# Patient Record
Sex: Male | Born: 1945 | Race: White | Hispanic: No | Marital: Married | Smoking: Never smoker
Health system: Southern US, Community
[De-identification: ages and names within clinical notes are randomized; demographics above are authoritative.]

## PROBLEM LIST (undated history)

## (undated) DIAGNOSIS — M1990 Unspecified osteoarthritis, unspecified site: Secondary | ICD-10-CM

## (undated) DIAGNOSIS — E785 Hyperlipidemia, unspecified: Secondary | ICD-10-CM

## (undated) DIAGNOSIS — C679 Malignant neoplasm of bladder, unspecified: Secondary | ICD-10-CM

## (undated) DIAGNOSIS — S8292XA Unspecified fracture of left lower leg, initial encounter for closed fracture: Secondary | ICD-10-CM

## (undated) DIAGNOSIS — Z96642 Presence of left artificial hip joint: Secondary | ICD-10-CM

## (undated) DIAGNOSIS — C439 Malignant melanoma of skin, unspecified: Secondary | ICD-10-CM

## (undated) DIAGNOSIS — D649 Anemia, unspecified: Secondary | ICD-10-CM

## (undated) DIAGNOSIS — K2970 Gastritis, unspecified, without bleeding: Secondary | ICD-10-CM

## (undated) DIAGNOSIS — H409 Unspecified glaucoma: Secondary | ICD-10-CM

## (undated) DIAGNOSIS — M109 Gout, unspecified: Secondary | ICD-10-CM

## (undated) DIAGNOSIS — N200 Calculus of kidney: Secondary | ICD-10-CM

## (undated) DIAGNOSIS — K56609 Unspecified intestinal obstruction, unspecified as to partial versus complete obstruction: Secondary | ICD-10-CM

## (undated) DIAGNOSIS — B9681 Helicobacter pylori [H. pylori] as the cause of diseases classified elsewhere: Secondary | ICD-10-CM

## (undated) DIAGNOSIS — I10 Essential (primary) hypertension: Secondary | ICD-10-CM

## (undated) DIAGNOSIS — J189 Pneumonia, unspecified organism: Secondary | ICD-10-CM

## (undated) DIAGNOSIS — R011 Cardiac murmur, unspecified: Secondary | ICD-10-CM

## (undated) DIAGNOSIS — Z87442 Personal history of urinary calculi: Secondary | ICD-10-CM

## (undated) DIAGNOSIS — Z9889 Other specified postprocedural states: Secondary | ICD-10-CM

## (undated) HISTORY — PX: KNEE ARTHROSCOPY: SUR90

## (undated) HISTORY — DX: Unspecified glaucoma: H40.9

## (undated) HISTORY — DX: Hyperlipidemia, unspecified: E78.5

## (undated) HISTORY — DX: Presence of left artificial hip joint: Z96.642

## (undated) HISTORY — DX: Gastritis, unspecified, without bleeding: K29.70

## (undated) HISTORY — PX: FRACTURE SURGERY: SHX138

## (undated) HISTORY — DX: Helicobacter pylori (H. pylori) as the cause of diseases classified elsewhere: B96.81

## (undated) HISTORY — PX: HERNIA REPAIR: SHX51

## (undated) HISTORY — DX: Malignant melanoma of skin, unspecified: C43.9

---
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
## 1994-03-20 DIAGNOSIS — C439 Malignant melanoma of skin, unspecified: Secondary | ICD-10-CM

## 1994-03-20 HISTORY — DX: Malignant melanoma of skin, unspecified: C43.9

## 2002-02-05 ENCOUNTER — Emergency Department (HOSPITAL_COMMUNITY): Admission: EM | Admit: 2002-02-05 | Discharge: 2002-02-05 | Payer: Self-pay | Admitting: Emergency Medicine

## 2002-02-05 ENCOUNTER — Encounter: Payer: Self-pay | Admitting: Emergency Medicine

## 2002-03-20 HISTORY — PX: LUMBAR LAMINECTOMY: SHX95

## 2002-05-23 ENCOUNTER — Encounter: Payer: Self-pay | Admitting: Orthopedic Surgery

## 2002-05-30 ENCOUNTER — Encounter: Payer: Self-pay | Admitting: Orthopedic Surgery

## 2002-05-30 ENCOUNTER — Observation Stay (HOSPITAL_COMMUNITY): Admission: RE | Admit: 2002-05-30 | Discharge: 2002-06-01 | Payer: Self-pay | Admitting: Orthopedic Surgery

## 2002-07-19 HISTORY — PX: COLONOSCOPY: SHX174

## 2002-08-02 ENCOUNTER — Encounter: Payer: Self-pay | Admitting: Internal Medicine

## 2002-08-02 ENCOUNTER — Encounter: Admission: RE | Admit: 2002-08-02 | Discharge: 2002-08-02 | Payer: Self-pay | Admitting: Internal Medicine

## 2004-03-22 ENCOUNTER — Emergency Department (HOSPITAL_COMMUNITY): Admission: EM | Admit: 2004-03-22 | Discharge: 2004-03-22 | Payer: Self-pay | Admitting: Emergency Medicine

## 2005-01-06 ENCOUNTER — Ambulatory Visit: Payer: Self-pay | Admitting: Internal Medicine

## 2005-06-09 ENCOUNTER — Ambulatory Visit: Payer: Self-pay | Admitting: Internal Medicine

## 2005-08-04 ENCOUNTER — Ambulatory Visit: Payer: Self-pay | Admitting: Internal Medicine

## 2006-03-08 ENCOUNTER — Ambulatory Visit: Payer: Self-pay | Admitting: Internal Medicine

## 2006-03-08 LAB — CONVERTED CEMR LAB
ALT: 38 units/L (ref 0–40)
AST: 33 units/L (ref 0–37)
BUN: 12 mg/dL (ref 6–23)
Chol/HDL Ratio, serum: 6.9
Cholesterol: 235 mg/dL (ref 0–200)
Creatinine, Ser: 1.1 mg/dL (ref 0.4–1.5)
Glucose, Bld: 107 mg/dL — ABNORMAL HIGH (ref 70–99)
HDL: 34 mg/dL — ABNORMAL LOW (ref >39.0)
LDL DIRECT: 187.2 mg/dL
PSA: 0.46 ng/mL (ref 0.10–4.00)
Potassium: 4.3 meq/L (ref 3.5–5.1)
Total CK: 224 units/L (ref 7–195)
Triglyceride fasting, serum: 151 mg/dL — ABNORMAL HIGH (ref 0–149)
Uric Acid, Serum: 8.2 mg/dL — ABNORMAL HIGH (ref 2.4–7.0)
VLDL: 30 mg/dL (ref 0–40)

## 2006-05-31 ENCOUNTER — Ambulatory Visit: Payer: Self-pay | Admitting: Internal Medicine

## 2006-07-06 ENCOUNTER — Ambulatory Visit: Payer: Self-pay | Admitting: Internal Medicine

## 2006-07-06 LAB — CONVERTED CEMR LAB
ALT: 28 units/L (ref 0–40)
AST: 28 units/L (ref 0–37)
Cholesterol: 138 mg/dL (ref 0–200)
HDL: 34.9 mg/dL — ABNORMAL LOW (ref >39.0)
Hgb A1c MFr Bld: 5.7 % (ref 4.6–6.0)
Total CHOL/HDL Ratio: 4
Total CK: 208 units/L (ref 7–195)
Triglycerides: 111 mg/dL (ref 0–149)
Uric Acid, Serum: 6.3 mg/dL (ref 2.4–7.0)
VLDL: 22 mg/dL (ref 0–40)

## 2006-12-21 ENCOUNTER — Ambulatory Visit: Payer: Self-pay | Admitting: Internal Medicine

## 2006-12-28 ENCOUNTER — Encounter (INDEPENDENT_AMBULATORY_CARE_PROVIDER_SITE_OTHER): Payer: Self-pay | Admitting: *Deleted

## 2006-12-28 LAB — CONVERTED CEMR LAB
ALT: 30 units/L (ref 0–53)
AST: 28 units/L (ref 0–37)
Cholesterol: 136 mg/dL (ref 0–200)
HDL: 28.2 mg/dL — ABNORMAL LOW (ref >39.0)
LDL Cholesterol: 86 mg/dL (ref 0–99)
Total CHOL/HDL Ratio: 4.8
Triglycerides: 108 mg/dL (ref 0–149)
VLDL: 22 mg/dL (ref 0–40)

## 2007-01-07 DIAGNOSIS — R0989 Other specified symptoms and signs involving the circulatory and respiratory systems: Secondary | ICD-10-CM | POA: Insufficient documentation

## 2007-01-07 DIAGNOSIS — Z9889 Other specified postprocedural states: Secondary | ICD-10-CM

## 2007-01-07 DIAGNOSIS — K36 Other appendicitis: Secondary | ICD-10-CM | POA: Insufficient documentation

## 2007-01-07 HISTORY — DX: Other specified postprocedural states: Z98.89

## 2007-01-11 ENCOUNTER — Ambulatory Visit: Payer: Self-pay | Admitting: Internal Medicine

## 2007-01-11 DIAGNOSIS — I152 Hypertension secondary to endocrine disorders: Secondary | ICD-10-CM | POA: Insufficient documentation

## 2007-01-11 DIAGNOSIS — I10 Essential (primary) hypertension: Secondary | ICD-10-CM | POA: Insufficient documentation

## 2007-01-11 DIAGNOSIS — H409 Unspecified glaucoma: Secondary | ICD-10-CM | POA: Insufficient documentation

## 2007-01-11 DIAGNOSIS — E1169 Type 2 diabetes mellitus with other specified complication: Secondary | ICD-10-CM | POA: Insufficient documentation

## 2007-01-11 DIAGNOSIS — E782 Mixed hyperlipidemia: Secondary | ICD-10-CM | POA: Insufficient documentation

## 2007-01-11 LAB — CONVERTED CEMR LAB
Cholesterol, target level: 200 mg/dL
HDL goal, serum: 40 mg/dL
LDL Goal: 130 mg/dL

## 2007-06-25 ENCOUNTER — Telehealth (INDEPENDENT_AMBULATORY_CARE_PROVIDER_SITE_OTHER): Payer: Self-pay | Admitting: *Deleted

## 2007-09-02 ENCOUNTER — Telehealth (INDEPENDENT_AMBULATORY_CARE_PROVIDER_SITE_OTHER): Payer: Self-pay | Admitting: *Deleted

## 2007-09-06 ENCOUNTER — Encounter (INDEPENDENT_AMBULATORY_CARE_PROVIDER_SITE_OTHER): Payer: Self-pay | Admitting: *Deleted

## 2007-10-23 ENCOUNTER — Ambulatory Visit: Payer: Self-pay | Admitting: Internal Medicine

## 2007-10-23 DIAGNOSIS — Z8679 Personal history of other diseases of the circulatory system: Secondary | ICD-10-CM | POA: Insufficient documentation

## 2007-10-23 DIAGNOSIS — R748 Abnormal levels of other serum enzymes: Secondary | ICD-10-CM | POA: Insufficient documentation

## 2007-10-28 ENCOUNTER — Telehealth (INDEPENDENT_AMBULATORY_CARE_PROVIDER_SITE_OTHER): Payer: Self-pay | Admitting: *Deleted

## 2008-01-31 ENCOUNTER — Ambulatory Visit: Payer: Self-pay | Admitting: Internal Medicine

## 2008-01-31 ENCOUNTER — Telehealth (INDEPENDENT_AMBULATORY_CARE_PROVIDER_SITE_OTHER): Payer: Self-pay | Admitting: *Deleted

## 2008-02-02 LAB — CONVERTED CEMR LAB
Creatinine,U: 189.2 mg/dL
Hgb A1c MFr Bld: 6.3 % — ABNORMAL HIGH (ref 4.6–6.0)
Microalb Creat Ratio: 7.9 mg/g (ref 0.0–30.0)
Microalb, Ur: 1.5 mg/dL (ref 0.0–1.9)

## 2008-02-03 ENCOUNTER — Encounter (INDEPENDENT_AMBULATORY_CARE_PROVIDER_SITE_OTHER): Payer: Self-pay | Admitting: *Deleted

## 2008-03-17 ENCOUNTER — Telehealth: Payer: Self-pay | Admitting: Internal Medicine

## 2008-03-27 ENCOUNTER — Ambulatory Visit: Payer: Self-pay | Admitting: Internal Medicine

## 2008-03-27 DIAGNOSIS — Z8739 Personal history of other diseases of the musculoskeletal system and connective tissue: Secondary | ICD-10-CM | POA: Insufficient documentation

## 2008-05-18 HISTORY — PX: SHOULDER SURGERY: SHX246

## 2008-06-10 ENCOUNTER — Ambulatory Visit (HOSPITAL_COMMUNITY): Admission: RE | Admit: 2008-06-10 | Discharge: 2008-06-11 | Payer: Self-pay | Admitting: Orthopedic Surgery

## 2008-07-24 ENCOUNTER — Ambulatory Visit: Payer: Self-pay | Admitting: Internal Medicine

## 2008-07-25 LAB — CONVERTED CEMR LAB
ALT: 26 units/L (ref 0–53)
AST: 21 units/L (ref 0–37)
Albumin: 3.6 g/dL (ref 3.5–5.2)
Alkaline Phosphatase: 54 units/L (ref 39–117)
BUN: 14 mg/dL (ref 6–23)
Bilirubin, Direct: 0 mg/dL (ref 0.0–0.3)
Cholesterol: 130 mg/dL (ref 0–200)
Creatinine, Ser: 1 mg/dL (ref 0.4–1.5)
HDL: 30.4 mg/dL — ABNORMAL LOW (ref >39.00)
Hgb A1c MFr Bld: 6.1 % (ref 4.6–6.5)
LDL Cholesterol: 77 mg/dL (ref 0–99)
Potassium: 4.2 meq/L (ref 3.5–5.1)
Total Bilirubin: 0.7 mg/dL (ref 0.3–1.2)
Total CHOL/HDL Ratio: 4
Total Protein: 7 g/dL (ref 6.0–8.3)
Triglycerides: 113 mg/dL (ref 0.0–149.0)
VLDL: 22.6 mg/dL (ref 0.0–40.0)

## 2008-07-27 ENCOUNTER — Encounter (INDEPENDENT_AMBULATORY_CARE_PROVIDER_SITE_OTHER): Payer: Self-pay | Admitting: *Deleted

## 2008-09-11 ENCOUNTER — Encounter: Payer: Self-pay | Admitting: Internal Medicine

## 2008-09-25 ENCOUNTER — Ambulatory Visit: Payer: Self-pay | Admitting: Internal Medicine

## 2008-09-25 LAB — CONVERTED CEMR LAB
ALT: 39 units/L (ref 0–53)
AST: 32 units/L (ref 0–37)
Albumin: 3.6 g/dL (ref 3.5–5.2)
Alkaline Phosphatase: 51 units/L (ref 39–117)
BUN: 19 mg/dL (ref 6–23)
Bilirubin, Direct: 0.1 mg/dL (ref 0.0–0.3)
Cholesterol: 125 mg/dL (ref 0–200)
Creatinine, Ser: 1.2 mg/dL (ref 0.4–1.5)
Creatinine,U: 251.2 mg/dL
HDL: 31.4 mg/dL — ABNORMAL LOW (ref >39.00)
Hgb A1c MFr Bld: 6.6 % — ABNORMAL HIGH (ref 4.6–6.5)
LDL Cholesterol: 71 mg/dL (ref 0–99)
Microalb Creat Ratio: 7.6 mg/g (ref 0.0–30.0)
Microalb, Ur: 1.9 mg/dL (ref 0.0–1.9)
Potassium: 4.4 meq/L (ref 3.5–5.1)
Total Bilirubin: 0.8 mg/dL (ref 0.3–1.2)
Total CHOL/HDL Ratio: 4
Total Protein: 6.6 g/dL (ref 6.0–8.3)
Triglycerides: 111 mg/dL (ref 0.0–149.0)
VLDL: 22.2 mg/dL (ref 0.0–40.0)

## 2008-10-02 ENCOUNTER — Ambulatory Visit: Payer: Self-pay | Admitting: Internal Medicine

## 2008-10-02 DIAGNOSIS — E1140 Type 2 diabetes mellitus with diabetic neuropathy, unspecified: Secondary | ICD-10-CM | POA: Insufficient documentation

## 2008-10-02 DIAGNOSIS — E1165 Type 2 diabetes mellitus with hyperglycemia: Secondary | ICD-10-CM

## 2009-01-29 ENCOUNTER — Ambulatory Visit: Payer: Self-pay | Admitting: Internal Medicine

## 2009-02-01 LAB — CONVERTED CEMR LAB
BUN: 10 mg/dL (ref 6–23)
Creatinine, Ser: 1 mg/dL (ref 0.4–1.5)
Hgb A1c MFr Bld: 5.9 % (ref 4.6–6.5)
Potassium: 4.2 meq/L (ref 3.5–5.1)

## 2009-02-05 ENCOUNTER — Ambulatory Visit: Payer: Self-pay | Admitting: Internal Medicine

## 2009-04-29 ENCOUNTER — Telehealth (INDEPENDENT_AMBULATORY_CARE_PROVIDER_SITE_OTHER): Payer: Self-pay | Admitting: *Deleted

## 2009-05-10 ENCOUNTER — Telehealth (INDEPENDENT_AMBULATORY_CARE_PROVIDER_SITE_OTHER): Payer: Self-pay | Admitting: *Deleted

## 2009-07-23 ENCOUNTER — Ambulatory Visit: Payer: Self-pay | Admitting: Internal Medicine

## 2009-08-01 LAB — CONVERTED CEMR LAB
ALT: 24 units/L (ref 0–53)
AST: 23 units/L (ref 0–37)
Albumin: 3.8 g/dL (ref 3.5–5.2)
Alkaline Phosphatase: 57 units/L (ref 39–117)
BUN: 16 mg/dL (ref 6–23)
Bilirubin, Direct: 0.1 mg/dL (ref 0.0–0.3)
Cholesterol: 158 mg/dL (ref 0–200)
Creatinine, Ser: 1 mg/dL (ref 0.4–1.5)
Creatinine,U: 143.8 mg/dL
HDL: 33.3 mg/dL — ABNORMAL LOW (ref >39.00)
Hgb A1c MFr Bld: 6.4 % (ref 4.6–6.5)
LDL Cholesterol: 101 mg/dL — ABNORMAL HIGH (ref 0–99)
Microalb Creat Ratio: 0.3 mg/g (ref 0.0–30.0)
Microalb, Ur: 0.5 mg/dL (ref 0.0–1.9)
Potassium: 4.9 meq/L (ref 3.5–5.1)
Total Bilirubin: 0.6 mg/dL (ref 0.3–1.2)
Total CHOL/HDL Ratio: 5
Total Protein: 6.9 g/dL (ref 6.0–8.3)
Triglycerides: 118 mg/dL (ref 0.0–149.0)
VLDL: 23.6 mg/dL (ref 0.0–40.0)

## 2009-08-06 ENCOUNTER — Ambulatory Visit: Payer: Self-pay | Admitting: Internal Medicine

## 2010-01-28 ENCOUNTER — Telehealth: Payer: Self-pay | Admitting: Internal Medicine

## 2010-02-25 ENCOUNTER — Encounter: Payer: Self-pay | Admitting: Internal Medicine

## 2010-02-25 ENCOUNTER — Telehealth: Payer: Self-pay | Admitting: Internal Medicine

## 2010-03-02 ENCOUNTER — Telehealth: Payer: Self-pay | Admitting: Internal Medicine

## 2010-03-03 ENCOUNTER — Ambulatory Visit: Payer: Self-pay | Admitting: Internal Medicine

## 2010-03-03 ENCOUNTER — Encounter
Admission: RE | Admit: 2010-03-03 | Discharge: 2010-03-03 | Payer: Self-pay | Source: Home / Self Care | Attending: Internal Medicine | Admitting: Internal Medicine

## 2010-03-03 DIAGNOSIS — M545 Low back pain, unspecified: Secondary | ICD-10-CM | POA: Insufficient documentation

## 2010-03-04 ENCOUNTER — Encounter
Admission: RE | Admit: 2010-03-04 | Discharge: 2010-03-04 | Payer: Self-pay | Source: Home / Self Care | Attending: Internal Medicine | Admitting: Internal Medicine

## 2010-03-04 LAB — CONVERTED CEMR LAB
Creatinine,U: 239.7 mg/dL
Hgb A1c MFr Bld: 6.5 % (ref 4.6–6.5)
Microalb Creat Ratio: 0.5 mg/g (ref 0.0–30.0)
Microalb, Ur: 1.3 mg/dL (ref 0.0–1.9)

## 2010-03-07 ENCOUNTER — Telehealth (INDEPENDENT_AMBULATORY_CARE_PROVIDER_SITE_OTHER): Payer: Self-pay | Admitting: *Deleted

## 2010-03-20 DIAGNOSIS — C679 Malignant neoplasm of bladder, unspecified: Secondary | ICD-10-CM

## 2010-03-20 HISTORY — DX: Malignant neoplasm of bladder, unspecified: C67.9

## 2010-03-24 ENCOUNTER — Encounter: Payer: Self-pay | Admitting: Internal Medicine

## 2010-03-29 ENCOUNTER — Inpatient Hospital Stay (HOSPITAL_COMMUNITY)
Admission: RE | Admit: 2010-03-29 | Discharge: 2010-04-02 | Payer: Self-pay | Source: Home / Self Care | Attending: Neurosurgery | Admitting: Neurosurgery

## 2010-04-04 LAB — GLUCOSE, CAPILLARY
Glucose-Capillary: 116 mg/dL — ABNORMAL HIGH (ref 70–99)
Glucose-Capillary: 178 mg/dL — ABNORMAL HIGH (ref 70–99)
Glucose-Capillary: 134 mg/dL — ABNORMAL HIGH (ref 70–99)
Glucose-Capillary: 121 mg/dL — ABNORMAL HIGH (ref 70–99)
Glucose-Capillary: 109 mg/dL — ABNORMAL HIGH (ref 70–99)
Glucose-Capillary: 113 mg/dL — ABNORMAL HIGH (ref 70–99)
Glucose-Capillary: 125 mg/dL — ABNORMAL HIGH (ref 70–99)
Glucose-Capillary: 118 mg/dL — ABNORMAL HIGH (ref 70–99)
Glucose-Capillary: 110 mg/dL — ABNORMAL HIGH (ref 70–99)
Glucose-Capillary: 126 mg/dL — ABNORMAL HIGH (ref 70–99)
Glucose-Capillary: 120 mg/dL — ABNORMAL HIGH (ref 70–99)
Glucose-Capillary: 108 mg/dL — ABNORMAL HIGH (ref 70–99)
Glucose-Capillary: 98 mg/dL (ref 70–99)
Glucose-Capillary: 131 mg/dL — ABNORMAL HIGH (ref 70–99)

## 2010-04-04 LAB — BASIC METABOLIC PANEL
BUN: 14 mg/dL (ref 6–23)
CO2: 27 mEq/L (ref 19–32)
Calcium: 9.7 mg/dL (ref 8.4–10.5)
Chloride: 106 mEq/L (ref 96–112)
Creatinine, Ser: 0.98 mg/dL (ref 0.4–1.5)
GFR calc Af Amer: >60 mL/min (ref >60)
GFR calc non Af Amer: >60 mL/min (ref >60)
Glucose, Bld: 112 mg/dL — ABNORMAL HIGH (ref 70–99)
Potassium: 4.5 mEq/L (ref 3.5–5.1)
Sodium: 141 mEq/L (ref 135–145)

## 2010-04-04 LAB — CBC
HCT: 39.1 % (ref 39.0–52.0)
Hemoglobin: 13.2 g/dL (ref 13.0–17.0)
MCH: 28.8 pg (ref 26.0–34.0)
MCHC: 33.8 g/dL (ref 30.0–36.0)
MCV: 85.2 fL (ref 78.0–100.0)
Platelets: 184 10*3/uL (ref 150–400)
RBC: 4.59 MIL/uL (ref 4.22–5.81)
RDW: 13.1 % (ref 11.5–15.5)
WBC: 11 10*3/uL — ABNORMAL HIGH (ref 4.0–10.5)

## 2010-04-04 LAB — SURGICAL PCR SCREEN
MRSA, PCR: NEGATIVE
Staphylococcus aureus: NEGATIVE

## 2010-04-11 ENCOUNTER — Encounter: Payer: Self-pay | Admitting: Internal Medicine

## 2010-04-11 NOTE — Discharge Summary (Signed)
  [name], Carlson NO.:  [id number]  MEDICAL RECORD NO.:  [id number]          PATIENT TYPE:  INP  LOCATION:  3020                         FACILITY:  [facility]  PHYSICIAN:  Hilda Lias, M.D.   DATE OF BIRTH:  1945-07-11  DATE OF ADMISSION:  03/29/2010 DATE OF DISCHARGE:  04/02/2010                              DISCHARGE SUMMARY   ADMISSION DIAGNOSES:  L3-L4 stenosis with L3-L4 herniated disk, 3-4 right extraforaminal herniated disk status post 4-5 diskectomy.  FINAL DIAGNOSES:  L3-L4 stenosis with L3-L4 herniated disk, 3-4 right extraforaminal herniated disk status post 4-5 diskectomy.  CLINICAL HISTORY:  The patient was admitted because of back pain radiating to the left leg.  Previously, this gentleman had L4-L5 laminectomy for diskectomy.  Clinically, he has weakness of the left quadriceps.  MRI showed that he has a herniated disk at L3-4 with stenosis to the left side and extraforaminal to the right side.  Surgery was advised in view of no improvement.  Laboratory normal.  COURSE IN THE HOSPITAL:  The patient was taken to surgery, and bilateral 3-4 laminectomy with decompression of the thecal sac, 3-4 diskectomy, lysis of adhesions, repair of the dura, and right 3-4 foraminotomy was done.  After surgery, the patient was kept flat in bed for 48 hours. Today, he is ambulating.  He has no pain.  He has some numbness of the quadriceps.  The wounds were healed.  He is being discharged today, to be followed by me in my office.  CONDITION ON DISCHARGE:  Improving.  MEDICATIONS: 1. Percocet. 2. Diazepam. 3. Neurontin.  DIET:  Regular.  ACTIVITY:  Not to drive for at least 2 weeks.  FOLLOWUP:  He has an appointment to see me in 10 days.         ______________________________ Hilda Lias, M.D.    EB/MEDQ  D:  04/02/2010  T:  04/02/2010  Job:  [id number]  Electronically Signed by Hilda Lias M.D. on 04/11/2010 05:21:53 PM

## 2010-04-17 LAB — CONVERTED CEMR LAB
ALT: 30 units/L (ref 0–53)
AST: 27 units/L (ref 0–37)
Albumin: 3.8 g/dL (ref 3.5–5.2)
Alkaline Phosphatase: 70 units/L (ref 39–117)
BUN: 16 mg/dL (ref 6–23)
Basophils Absolute: 0 10*3/uL (ref 0.0–0.1)
Basophils Relative: 0.5 % (ref 0.0–3.0)
Bilirubin, Direct: 0.1 mg/dL (ref 0.0–0.3)
CO2: 29 meq/L (ref 19–32)
Calcium: 9.4 mg/dL (ref 8.4–10.5)
Chloride: 104 meq/L (ref 96–112)
Cholesterol: 143 mg/dL (ref 0–200)
Creatinine, Ser: 1 mg/dL (ref 0.4–1.5)
Direct LDL: 96 mg/dL
Eosinophils Absolute: 0.3 10*3/uL (ref 0.0–0.7)
Eosinophils Relative: 4.7 % (ref 0.0–5.0)
GFR calc Af Amer: 97 mL/min
GFR calc non Af Amer: 80 mL/min
Glucose, Bld: 101 mg/dL — ABNORMAL HIGH (ref 70–99)
HCT: 40.5 % (ref 39.0–52.0)
HDL: 30.8 mg/dL — ABNORMAL LOW (ref >39.0)
Hemoglobin: 14 g/dL (ref 13.0–17.0)
Hgb A1c MFr Bld: 6.4 % — ABNORMAL HIGH (ref 4.6–6.0)
Lymphocytes Relative: 35 % (ref 12.0–46.0)
MCHC: 34.5 g/dL (ref 30.0–36.0)
MCV: 87.6 fL (ref 78.0–100.0)
Monocytes Absolute: 0.5 10*3/uL (ref 0.1–1.0)
Monocytes Relative: 8.6 % (ref 3.0–12.0)
Neutro Abs: 3.3 10*3/uL (ref 1.4–7.7)
Neutrophils Relative %: 51.2 % (ref 43.0–77.0)
PSA: 0.37 ng/mL (ref 0.10–4.00)
Platelets: 193 10*3/uL (ref 150–400)
Potassium: 4.1 meq/L (ref 3.5–5.1)
RBC: 4.62 M/uL (ref 4.22–5.81)
RDW: 12.7 % (ref 11.5–14.6)
Sodium: 140 meq/L (ref 135–145)
TSH: 0.85 microintl units/mL (ref 0.35–5.50)
Total Bilirubin: 1 mg/dL (ref 0.3–1.2)
Total CHOL/HDL Ratio: 4.6
Total CK: 214 units/L (ref 7–195)
Total Protein: 7 g/dL (ref 6.0–8.3)
Triglycerides: 86 mg/dL (ref 0–149)
VLDL: 17 mg/dL (ref 0–40)
WBC: 6.3 10*3/uL (ref 4.5–10.5)

## 2010-04-19 NOTE — Assessment & Plan Note (Signed)
Summary: ov with meds,bp cuff,drug list/cbs   Vital Signs:  Patient Profile:   65 Years Old Male Height:     69.75 inches Weight:      210 pounds Temp:     98.0 degrees F oral Pulse rate:   64 / minute Resp:     16 per minute BP sitting:   140 / 84  (left arm) Cuff size:   large  Vitals Entered By: Shonna Chock (March 27, 2008 3:03 PM)             Comments 171/91, P:59./[facility]rae [facility]  March 27, 2008 3:04 PM      Chief Complaint:  B/P CONCERNS AND FOLLOW-UP ON MEDS.  History of Present Illness: His cuff read 171/83; ours read 140/84.Recheck 169/96; 135/85. Faint flow @ 160 but definitive sound was @ 135 systolic. Range @ home was 118/61-190/92  Hypertension History:      He denies headache, chest pain, palpitations, dyspnea with exertion, orthopnea, PND, peripheral edema, visual symptoms, neurologic problems, syncope, and side effects from treatment.  He notes no problems with any antihypertensive medication side effects.        Positive major cardiovascular risk factors include male age 61 years old or older, hyperlipidemia, and hypertension.  Negative major cardiovascular risk factors include no history of diabetes and non-tobacco-user status.        Further assessment for target organ damage reveals no history of ASHD, stroke/TIA, or peripheral vascular disease.       Current Allergies: No known allergies   Past Medical History:    Hyperlipidemia    Hypertension    Femoral Bruits    Glaucoma  ; double PNA 2003    Gout     Review of Systems  Eyes      Denies blurring, double vision, and vision loss-both eyes.  MS      Complains of joint pain.      Denies joint redness and joint swelling.      pain  R shoulder  Neuro      Denies disturbances in coordination, numbness, poor balance, and tingling.   Physical Exam  General:     in no acute distress; alert,appropriate and cooperative throughout examination Lungs:     Normal respiratory effort, chest  expands symmetrically. Lungs are clear to auscultation, no crackles or wheezes. Heart:     Normal rate and regular rhythm. S1 and S2 normal without gallop, murmur, click, rub or other extra sounds. Pulses:     R and L carotid,radial,dorsalis pedis and posterior tibial pulses are full and equal bilaterally Extremities:     No edema    Impression & Recommendations:  Problem # 1:  HYPERTENSION, ESSENTIAL NOS (ICD-401.9)  His updated medication list for this problem includes:    Carvedilol 25 Mg Tabs (Carvedilol) .Marland Kitchen... 1 by mouth two times a day    Lisinopril 20 Mg Tabs (Lisinopril) .Marland Kitchen... 2 once daily    Spironolactone 25 Mg Tabs (Spironolactone) .Marland Kitchen... 1 once daily   Problem # 2:  GOUT (ICD-274.9) PMH of  Problem # 3:  SHOULDER PAIN, RIGHT (ICD-719.41)  Orders: Orthopedic Referral (Ortho)   Complete Medication List: 1)  Vytorin 10-20 Mg Tabs (Ezetimibe-simvastatin) .Marland Kitchen.. 1 qhs 2)  Carvedilol 25 Mg Tabs (Carvedilol) .Marland Kitchen.. 1 by mouth two times a day 3)  Flax Seed  4)  Saw Palmetto  5)  Asa 81mg   6)  Levitra 20 Mg Tabs (Vardenafil hcl) .... 1/2 to 1 once  daily as needed 7)  Lisinopril 20 Mg Tabs (Lisinopril) .... 2 once daily 8)  Metformin Hcl 500 Mg Xr24h-tab (Metformin hcl) .Marland Kitchen.. 1 once daily with largest meal 9)  Betoptic-s 0.25 % Susp (Betaxolol hcl) .Marland Kitchen.. 1 gtt in each eye two times a day 10)  Vitamin D 1000 Unit Caps (Cholecalciferol) .Marland Kitchen.. 1 by mouth once daily 11)  Spironolactone 25 Mg Tabs (Spironolactone) .Marland Kitchen.. 1 once daily  Hypertension Assessment/Plan:      The patient's hypertensive risk group is category B: At least one risk factor (excluding diabetes) with no target organ damage.  His calculated 10 year risk of coronary heart disease is 14 %.  Today's blood pressure is 140/84.     Patient Instructions: 1)  Please schedule a follow-up appointment in 6 months. Codes : 401.9,272.4,995.20,250.00 2)  BUN,creat,K+ prior to visit, ICD-9: 3)  Hepatic Panel prior to visit,  ICD-9: 4)  Lipid Panel prior to visit, ICD-9: 5)  HbgA1C prior to visit, ICD-9: 6)  Urine Microalbumin prior to visit, ICD-9:   Prescriptions: SPIRONOLACTONE 25 MG TABS (SPIRONOLACTONE) 1 once daily  #90 x 1   Entered and Authorized by:   Marga Melnick MD   Signed by:   Marga Melnick MD on 03/27/2008   Method used:   Print then Give to Patient   RxID:   [id number] METFORMIN HCL 500 MG  XR24H-TAB (METFORMIN HCL) 1 once daily with largest meal  #90 x 1   Entered and Authorized by:   Marga Melnick MD   Signed by:   Marga Melnick MD on 03/27/2008   Method used:   Print then Give to Patient   RxID:   [id number] LISINOPRIL 20 MG  TABS (LISINOPRIL) 2 once daily  #180 x 1   Entered and Authorized by:   Marga Melnick MD   Signed by:   Marga Melnick MD on 03/27/2008   Method used:   Print then Give to Patient   RxID:   [id number] VYTORIN 10-20 MG  TABS (EZETIMIBE-SIMVASTATIN) 1 qhs  #90 x 1   Entered and Authorized by:   Marga Melnick MD   Signed by:   Marga Melnick MD on 03/27/2008   Method used:   Print then Give to Patient   RxID:   [id number] CARVEDILOL 25 MG  TABS (CARVEDILOL) 1 by mouth two times a day  #180 x 3   Entered and Authorized by:   Marga Melnick MD   Signed by:   Marga Melnick MD on 03/27/2008   Method used:   Print then Give to Patient   RxID:   [id number] SPIRONOLACTONE 25 MG TABS (SPIRONOLACTONE) 1 once daily  #90 x 0   Entered and Authorized by:   Marga Melnick MD   Signed by:   Marga Melnick MD on 03/27/2008   Method used:   Print then Give to Patient   RxID:   [id number]

## 2010-04-19 NOTE — Progress Notes (Signed)
Summary: **Recent Labs**  Phone Note Outgoing Call Call back at Home Phone [phone]   Call placed by: Shonna Chock,  October 28, 2007 11:21 AM Call placed to: Patient Details for Reason: ** RECENT LABS** Summary of Call: SPOKE WITH MRS.Klus, MR.Donald IS OUT OF TOWN. SHE IS AWARE THAT DR.HOPPER IS ADDING A NEW RX, RX WILL BE SENT ALONG WITH A COPY OF LABS AND APPOINTMENT CARD TO RECHECK LABS IN 3 MONTHS.Fredric Mare Malloy  October 28, 2007 11:26 AM       Prescriptions: METFORMIN HCL 500 MG  XR24H-TAB (METFORMIN HCL) 1 once daily with largest meal  #90 x 0   Entered by:   Shonna Chock   Authorized by:   Marga Melnick MD   Signed by:   Shonna Chock on 10/28/2007   Method used:   Print then Give to Patient   RxID:   [id number]

## 2010-04-19 NOTE — Assessment & Plan Note (Signed)
Summary: CPX--PH   Vital Signs:  Patient Profile:   65 Years Old Male Height:     69.75 inches Weight:      210.8 pounds Temp:     98.0 degrees F oral Pulse rate:   60 / minute Resp:     14 per minute BP sitting:   170 / 100  (left arm) Cuff size:   large  Vitals Entered By: Shonna Chock (October 23, 2007 9:07 AM)                 Chief Complaint:  CPX with Fasting Labs .  History of Present Illness: In am he has sensation  flushing of face ; also some postural symptoms occa intermittently X 2 months.See BP; repeat BP 140/95 but flow detected @ 165.    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  Eyes      Denies blurring, double vision, and vision loss-both eyes.      See PMH  ENT      Complains of nasal congestion and sinus pressure.      Denies decreased hearing, difficulty swallowing, earache, hoarseness, and ringing in ears.  CV      Denies bluish discoloration of lips or nails, chest pain or discomfort, difficulty breathing at night, difficulty breathing while lying down, leg cramps with exertion, palpitations, shortness of breath with exertion,  swelling of feet, and swelling of hands.  Resp      Denies cough, excessive snoring, hypersomnolence, morning headaches, shortness of breath, and sputum productive.  GI      Denies abdominal pain, bloody stools, constipation, dark tarry stools, diarrhea, indigestion, nausea, and vomiting.  GU      Denies decreased libido, dysuria, hematuria, and nocturia.      Levitra as needed effective  MS      Complains of joint pain and low back pain.      Denies joint redness, joint swelling, loss of strength, mid back pain, cramps, muscle weakness, and thoracic pain.      Thumb pain  Derm      See HPI      Denies changes in color of skin, changes in nail beds, dryness, flushing, hair loss, lesion(s), and rash.  Neuro      Denies disturbances in coordination, headaches, numbness, poor balance, and tingling.  Psych      Denies anxiety, depression, easily angered, easily tearful, and irritability.  Endo      Denies cold intolerance, excessive hunger, excessive thirst, excessive  urination, heat intolerance, polyuria, and weight change.  Heme      Denies abnormal bruising and bleeding.  Allergy      Denies itching eyes, seasonal allergies, and sneezing.      Perennial congestion, Tylenol Sinus   Physical Exam  General:     well-nourished,in no acute distress; alert,appropriate and cooperative throughout examination Head:     Normocephalic and atraumatic without obvious abnormalities. No apparent alopecia or balding. Eyes:     No corneal or conjunctival inflammation noted.Perrla. Funduscopic exam benign, without hemorrhages, exudates or papilledema. Ears:     External ear exam shows no significant lesions or deformities.  Otoscopic examination reveals clear canals, tympanic membranes are intact bilaterally without bulging, retraction, inflammation or discharge.Mild scarring. Hearing is grossly normal bilaterally. Nose:     External nasal examination shows no deformity or inflammation.  Nasal mucosa are pink and moist without lesions or exudates.Slight deviation of septum Mouth:     Oral mucosa and oropharynx without lesions or exudates. Dentures  Neck:     No deformities, masses, or tenderness noted. Lungs:     Normal respiratory effort, chest expands symmetrically. Lungs are clear to auscultation, no crackles or wheezes. Heart:     Normal rate and regular rhythm. S1 and S2 normal without gallop, murmur, click, rub or other extra sounds. Abdomen:     Bowel sounds positive,abdomen soft and non-tender without masses, organomegaly or hernias noted. Rectal:     No external abnormalities noted. Normal sphincter tone. No rectal masses or tenderness.FOB neg Genitalia:     Testes bilaterally descended without nodularity, tenderness or masses. Bilat granuloma in scrotum, R >L. No penis lesions or urethral discharge. Prostate:     Prostate gland firm and smooth, no enlargement, nodularity, tenderness, mass, asymmetry or induration. Msk:     No deformity or scoliosis noted of thoracic or lumbar spine.   Pulses:     R and L carotid,radial,dorsalis pedis and posterior tibial pulses are full and equal bilaterally Extremities:     No clubbing, cyanosis, edema. Minor DJD deformity noted  of hands Neurologic:     alert & oriented X3, gait normal, and DTRs symmetrical and normal.   Skin:     Intact without suspicious lesions or rashes Cervical Nodes:     No lymphadenopathy noted Axillary Nodes:     No palpable lymphadenopathy Psych:     memory intact for recent and remote, normally interactive, good eye contact, not anxious appearing, and not depressed appearing.      Impression & Recommendations:  Problem # 1:  ROUTINE GENERAL MEDICAL EXAM@HEALTH  CARE FACL (ICD-V70.0)  Orders: EKG w/ Interpretation (93000) Venipuncture ([address]) TLB-Lipid Panel (80061-LIPID) TLB-BMP (Basic Metabolic Panel-BMET) (80048-METABOL) TLB-CBC Platelet - w/Differential (85025-CBCD)  TLB-Hepatic/Liver Function Pnl (80076-HEPATIC) TLB-TSH (Thyroid Stimulating Hormone) (84443-TSH) TLB-A1C / Hgb A1C (Glycohemoglobin) (83036-A1C) TLB-PSA (Prostate Specific Antigen) (84153-PSA)   Problem # 2:  HYPERTENSION, ESSENTIAL NOS (ICD-401.9)  His updated medication list for this problem includes:    Carvedilol 25 Mg Tabs (Carvedilol) .Marland Kitchen... 1 by mouth two times a day    Lisinopril 20 Mg Tabs (Lisinopril) .Marland Kitchen... 1 once daily if bp > 130/85  Orders: EKG w/ Interpretation (93000) Venipuncture (60454) TLB-BMP (Basic Metabolic Panel-BMET) (80048-METABOL)   Problem # 3:  POSTURAL HYPOTENSION, HX OF (ICD-V12.50)  Orders: Venipuncture (09811)   Problem # 4:  HYPERLIPIDEMIA (ICD-272.2)  His updated medication list for this problem includes:    Vytorin 10-20 Mg Tabs (Ezetimibe-simvastatin) .Marland Kitchen... 1 qhs  Orders:  Venipuncture (40347) TLB-Lipid Panel (80061-LIPID) TLB-Hepatic/Liver Function Pnl (80076-HEPATIC)   Problem # 5:  CPK, ABNORMAL (ICD-790.5) with statin therapy  Complete Medication List: 1)  Vytorin 10-20 Mg Tabs (Ezetimibe-simvastatin) .Marland Kitchen.. 1 qhs 2)  Carvedilol 25 Mg Tabs (Carvedilol) .Marland Kitchen.. 1 by mouth two times a day 3)  Flax Seed  4)  Saw Palmetto  5)  Asa 81mg   6)  Levitra 20 Mg Tabs (Vardenafil hcl) .... 1/2 to 1 once daily as needed 7)  Lisinopril 20 Mg Tabs (Lisinopril) .Marland Kitchen.. 1 once daily if bp > 130/85  Other Orders: TLB-CK Total Only(Creatine Kinase/CPK) (82550-CK)   Patient Instructions: 1)  Isometric exercises pre standing. 2)  Check your Blood Pressure regularly. If it is above:130/85 ON AVERAGE start Lisinopril 20 mg once daily    Prescriptions: LISINOPRIL 20 MG  TABS (LISINOPRIL) 1 once daily if BP > 130/85  #30 x 5   Entered and Authorized by:   Marga Melnick MD   Signed by:   Marga Melnick MD on 10/23/2007   Method used:   Print then Give to Patient   RxID:   [id number] LEVITRA 20 MG  TABS (VARDENAFIL HCL) 1/2 to 1 once daily as  needed  #6 x 5   Entered and Authorized by:   Marga Melnick MD   Signed by:   Marga Melnick MD on 10/23/2007   Method used:   Print then Give to Patient   RxID:   [id number] CARVEDILOL 25 MG  TABS (CARVEDILOL) 1 by mouth two times a day  #180 x 3   Entered and Authorized by:   Marga Melnick MD   Signed by:   Marga Melnick MD on 10/23/2007   Method used:   Print then Give to Patient   RxID:   [id number] VYTORIN 10-20 MG  TABS (EZETIMIBE-SIMVASTATIN) 1 qhs  #90 x 3   Entered and Authorized by:   Marga Melnick MD   Signed by:   Marga Melnick MD on 10/23/2007   Method used:   Print then Give to Patient   RxID:   [id number]  ]

## 2010-04-19 NOTE — Progress Notes (Signed)
Summary: BLOOD PRESSURE READINGS  Phone Note Call from Patient Call back at Home Phone [phone] Call back at Work Phone [phone]   Caller: Patient Summary of Call: PATIENT WAS HERE TODAY FOR LAB WORK AND DROPPED OFF A SMALL NOTEBOOK WITH HIS BLOOD PRESSURE READINGS--HE IS OFF WORK TODAY AND CAN BE REACHED AT [phone]  HE IS WORKING IN [place] THREE HOURS AWAY, SO IF HE NEEDS TO BE SEEN, IT WOULD BE MUCH BETTER TO SEE HIM TODAY IF POSSIBLE  DUE TO SIZE OF SMALL NOTEBOOK--ONLY MADE COPIES OF LAST FEW PAGES--WILL PUT IN 5X8 MANILLA ENVELOPE AND PLACE IN PLSTIC SLEEVE AND TAKE TO CHRAE Initial call taken by: Jerolyn Shin,  January 31, 2008 8:50 AM  Follow-up for Phone Call        01/19/08: 186/93 01/20/08: 132/71 01/21/08: 147/79 01/22/08: 138/66 01/23/08: 168/97 01/24/08: 197/102 01/25/08: 161/78 01/26/08: 170/88 01/27/08: 168/91 01/28/08: 148/70 01/29/08: 175/97 01/30/08: 198/97 01/31/08: 195/100 DR.HOPPER PLEASE ADVISE Follow-up by: Shonna Chock,  January 31, 2008 9:55 AM  Additional Follow-up for Phone Call Additional follow up Details #1::        Average is above goal of 130/85. Continue Carvedilol two times a day & take Lisinopril 20 mg two times a day . Monitor BP AVERAGE & report values over next 3 weeks on new doses. Additional Follow-up by: Marga Melnick MD,  February 02, 2008 9:43 AM    Additional Follow-up for Phone Call Additional follow up Details #2::    Left message on machine for patient to return call when avaliable Follow-up by: Shonna Chock,  February 03, 2008 10:07 AM  Additional Follow-up for Phone Call Additional follow up Details #3:: Details for Additional Follow-up Action Taken: CALLED PATIENT ON WORK NUMBER, WAS INSTRUCTED TO CALL PATIENT ON CELL [phone], LEFT MESSAGE ON MACHINE FOR PATIENT TO RETURN CALL WHEN AVALIABLE./Chrae Malloy  February 03, 2008 1:24 PM   SPOKE WITH PATIENT: PATIENT OK'D INSTRUCTION.Fredric Mare Malloy  February 03, 2008 2:37  PM   New/Updated Medications: LISINOPRIL 20 MG  TABS (LISINOPRIL) 2 once daily   Prescriptions: LISINOPRIL 20 MG  TABS (LISINOPRIL) 2 once daily  #60 x 5   Entered by:   Shonna Chock   Authorized by:   Marga Melnick MD   Signed by:   Shonna Chock on 02/03/2008   Method used:   Electronically to        [facility] [address] [phone]* (retail)       [address]       [place], [place]  [address]       Ph: [phone] or [phone]       Fax: [phone]   RxID:   [id number]

## 2010-04-19 NOTE — Letter (Signed)
Summary: Results Follow up Letter  [facility] [place]  [address] [place], [place] [address]   Phone: [phone]  Fax: [phone]    12/28/2006 MRN: [id number]  SENA CLOUATRE [address] [place], [place]  [address]  Dear Mr. VIRNIG,  The following are the results of your recent test(s):  Test         Result    Pap Smear:        Normal _____  Not Normal _____ Comments: ______________________________________________________ Cholesterol: LDL(Bad cholesterol):         Your goal is less than:         HDL (Good cholesterol):       Your goal is more than: Comments:  ______________________________________________________ Mammogram:        Normal _____  Not Normal _____ Comments:  ___________________________________________________________________ Hemoccult:        Normal _____  Not normal _______ Comments:    _____________________________________________________________________ Other Tests:  Please see attached results and comments   We routinely do not discuss normal results over the telephone.  If you desire a copy of the results, or you have any questions about this information we can discuss them at your next office visit.   Sincerely,

## 2010-04-19 NOTE — Letter (Signed)
Summary: Results Follow up Letter  [facility] [place]  [address] [place], [place] [address]   Phone: [phone]  Fax: [phone]    02/03/2008 MRN: [id number]  Donald Carlson [address] [place], [place]  [address]  Dear [name],  The following are the results of your recent test(s):  Test         Result    Pap Smear:        Normal _____  Not Normal _____ Comments: ______________________________________________________ Cholesterol: LDL(Bad cholesterol):         Your goal is less than:         HDL (Good cholesterol):       Your goal is more than: Comments:  ______________________________________________________ Mammogram:        Normal _____  Not Normal _____ Comments:  ___________________________________________________________________ Hemoccult:        Normal _____  Not normal _______ Comments:    _____________________________________________________________________ Other Tests: PLEASE SEE ATTACHED LABS DONE ON 01/31/2008, ALSO SEE ATTACHED APPOINTMENT CARD TO RECHECK LABS IN 6 MONTHS (07/2008)    We routinely do not discuss normal results over the telephone.  If you desire a copy of the results, or you have any questions about this information we can discuss them at your next office visit.   Sincerely,

## 2010-04-19 NOTE — Progress Notes (Signed)
Summary: gout rx  Phone Note Call from Patient Call back at Home Phone [phone]   Summary of Call: Patient spouse left message on triage that the patient is having trouble with gout in his big toe. She would like refill prescription [facility] to [facility] [address]. Please advise. Initial call taken by: Lucious Groves CMA,  January 28, 2010 11:23 AM  Follow-up for Phone Call        Per MD the patient can have Indomethacin 50mg  1 by mouth two times a day with meals as needed #20 no refills. Follow-up by: Lucious Groves CMA,  January 28, 2010 3:56 PM  Additional Follow-up for Phone Call Additional follow up Details #1::        Rx sent, called to notify and left message on machine to call back to office. Lucious Groves CMA  January 31, 2010 2:03 PM   Left message on machine to call back to office Lucious Groves [facility]  February 01, 2010 11:26 AM   Patient spouse notified. Lucious Groves CMA  February 01, 2010 12:49 PM     New/Updated Medications: INDOMETHACIN 50 MG CAPS (INDOMETHACIN) 1 by mouth two times a day with meals as needed Prescriptions: INDOMETHACIN 50 MG CAPS (INDOMETHACIN) 1 by mouth two times a day with meals as needed  #20 x 0   Entered by:   Lucious Groves CMA   Authorized by:   Marga Melnick MD   Signed by:   Lucious Groves CMA on 01/31/2010   Method used:   [facility]        [facility]  [phone][facility] (retail)       [address] [place], [place]  [address]       Ph: [phone] or [phone]       Fax: [phone]   RxID:   [id number]

## 2010-04-19 NOTE — Progress Notes (Signed)
Summary: Refill Request  Phone Note Refill Request Call back at Home Phone [phone] Message from:  Patient  Refills Requested: Medication #1:  VYTORIN 10-20 MG  TABS 1 qhs  Medication #2:  CARVEDILOL 25 MG  TABS 1 by mouth two times a day  Medication #3:  LISINOPRIL 20 MG  TABS 2 once daily  Medication #4:  SPIRONOLACTONE 25 MG TABS 1 once daily.  Method Requested: Pick up at Office Initial call taken by: Shonna Chock,  April 29, 2009 11:59 AM  Follow-up for Phone Call        Left message on VM informing patient RX's  is ready for pick-up Follow-up by: Shonna Chock,  April 29, 2009 12:03 PM    Prescriptions: CARVEDILOL 25 MG  TABS (CARVEDILOL) 1 by mouth two times a day  #180 x 2   Entered by:   Shonna Chock   Authorized by:   Marga Melnick MD   Signed by:   Shonna Chock on 04/29/2009   Method used:   Print then Give to Patient   RxID:   [id number] SPIRONOLACTONE 25 MG TABS (SPIRONOLACTONE) 1 once daily  #90 x 2   Entered by:   Shonna Chock   Authorized by:   Marga Melnick MD   Signed by:   Shonna Chock on 04/29/2009   Method used:   Print then Give to Patient   RxID:   [id number] LISINOPRIL 20 MG  TABS (LISINOPRIL) 2 once daily  #180 x 2   Entered by:   Shonna Chock   Authorized by:   Marga Melnick MD   Signed by:   Shonna Chock on 04/29/2009   Method used:   Print then Give to Patient   RxID:   [id number] VYTORIN 10-20 MG  TABS (EZETIMIBE-SIMVASTATIN) 1 qhs  #90 x 1   Entered by:   Shonna Chock   Authorized by:   Marga Melnick MD   Signed by:   Shonna Chock on 04/29/2009   Method used:   Print then Give to Patient   RxID:   [id number]

## 2010-04-19 NOTE — Assessment & Plan Note (Signed)
Summary: FU ON LABS/[facility]   Vital Signs:  Patient profile:   65 year old male Weight:      206.8 pounds BMI:     29.99 Pulse rate:   60 / minute Resp:     15 per minute BP sitting:   136 / 80  (left arm) Cuff size:   large  Vitals Entered By: Shonna Chock (February 05, 2009 8:14 AM) CC: Follow-up on labs, bloodsugar readings and blood pressure Comments REVIEWED MED LIST, PATIENT AGREED DOSE AND INSTRUCTION CORRECT    CC:  Follow-up on labs and bloodsugar readings and blood pressure.  History of Present Illness: Labs reviewed & risks discused. A1c down from 6.6%(( sugar 143/ 26% risk ) to 5.9%(123/18%). No specific diet but avoidance of "junk foods", no sodas(prev 48 oz of DrPepper/day) & decreased potatoes.Decreased CVE. FBS 104-116; 2 hrs post meal < 132. NO HYPOGLYCEMIA; weight stable.  Allergies (verified): No Known Drug Allergies  Review of Systems Eyes:  Exam 01/29/2009, no retinopathy. CV:  Denies chest pain or discomfort, leg cramps with exertion, lightheadness, near fainting, and shortness of breath with exertion. Derm:  Denies poor wound healing. Neuro:  Denies numbness and tingling. Endo:  Denies excessive hunger, excessive thirst, and excessive urination.  Physical Exam  General:  well-nourished,in no acute distress; alert,appropriate, focused  throughout examination Lungs:  Normal respiratory effort, chest expands symmetrically. Lungs are clear to auscultation, no crackles or wheezes. Heart:  normal rate, regular rhythm, no gallop, no rub, no JVD, and grade1/2-1  /6 systolic murmur.   Pulses:  R and L carotid,radial,dorsalis pedis and posterior tibial pulses are full and equal bilaterally Extremities:  No clubbing, cyanosis, edema. Good nail health Neurologic:  alert & oriented X3 and sensation intact to light touch over feet.   Skin:  Intact without suspicious lesions or rashes Psych:  memory intact for recent and remote.     Impression & Recommendations:   Problem # 1:  DIABETES MELLITUS, TYPE II (ICD-250.00)  His updated medication list for this problem includes:    Lisinopril 20 Mg Tabs (Lisinopril) .Marland Kitchen... 2 once daily    Metformin Hcl 500 Mg Xr24h-tab (Metformin hcl) .Marland Kitchen... 1 pill daily with the largest meal  Complete Medication List: 1)  Vytorin 10-20 Mg Tabs (Ezetimibe-simvastatin) .Marland Kitchen.. 1 qhs 2)  Carvedilol 25 Mg Tabs (Carvedilol) .Marland Kitchen.. 1 by mouth two times a day 3)  Flax Seed  4)  Saw Palmetto  5)  Asa 81mg   6)  Levitra 20 Mg Tabs (Vardenafil hcl) .... 1/2 to 1 once daily as needed 7)  Lisinopril 20 Mg Tabs (Lisinopril) .... 2 once daily 8)  Metformin Hcl 500 Mg Xr24h-tab (Metformin hcl) .Marland Kitchen.. 1 pill daily with the largest meal 9)  Betoptic-s 0.25 % Susp (Betaxolol hcl) .Marland Kitchen.. 1 gtt in each eye two times a day 10)  Vitamin D 1000 Unit Caps (Cholecalciferol) .Marland Kitchen.. 1 by mouth once daily 11)  Spironolactone 25 Mg Tabs (Spironolactone) .Marland Kitchen.. 1 once daily  Patient Instructions: 1)  Follow the Rules of 40 as discussed. 2)  Please schedule a follow-up appointment in 6 months. 3)  BUn,creat,K+ prior to visit, ICD-9: 4)  Hepatic Panel prior to visit, ICD-9: 5)  Lipid Panel prior to visit, ICD-9: 6)  HbgA1C prior to visit, ICD-9: 7)  Urine Microalbumin prior to visit, ICD-9:

## 2010-04-19 NOTE — Medication Information (Signed)
Summary: Patient Taking 2 Beta-Blockers/[employer]  Patient Taking 2 Beta-Blockers/[employer]   Imported By: Lanelle Bal 09/25/2008 11:32:31  _____________________________________________________________________  External Attachment:    Type:   Image     Comment:   External Document

## 2010-04-19 NOTE — Progress Notes (Signed)
Summary: BLOOD PRESSURE READING RECORD  Phone Note Call from Patient Call back at Home Phone [phone]   Caller: Patient Summary of Call: PATIENT'S WIFE IS SEEING DR Thailan Sava AT 11:15 ON 12/29---PATIENT WAS TOLD BY DR Franciscojavier Wronski TO "DROP OFF" HIS ONGOING B/P READINGS----HE WILL GIVEN SMALL NOTEBOOK TO NURSE WHEN HIS WIFE GOES BACK Initial call taken by: Jerolyn Shin,  March 17, 2008 11:09 AM  Follow-up for Phone Call        Patient here with wife, will show Dr.Ariq Khamis readings. Follow-up by: Shonna Chock,  March 17, 2008 12:20 PM  Additional Follow-up for Phone Call Additional follow up Details #1::        Records reviewed; BP readings are dramatically variable (low of 118/61 - high 200/95).this is not usual pattern; please see me with all meds ,2010 Drug Coverage List & your BP cuff. We need to verify the cuff is accurate in view of the dramatic variability in recorded BPs. Additional Follow-up by: Marga Melnick MD,  March 18, 2008 8:13 AM

## 2010-04-19 NOTE — Assessment & Plan Note (Signed)
Summary: review labs//fd   Vital Signs:  Patient profile:   65 year old male Weight:      212 pounds Pulse rate:   60 / minute Resp:     15 per minute BP sitting:   134 / 82  (left arm) Cuff size:   large  Vitals Entered By: Shonna Chock (Aug 06, 2009 8:10 AM) CC: Review labs (copy given), Hypertension Management Comments REVIEWED MED LIST, PATIENT AGREED DOSE AND INSTRUCTION CORRECT    CC:  Review labs (copy given) and Hypertension Management.  History of Present Illness: Labs reviewed & risk assessed; A1c is 6.4% , but it was 5.9% in 01/2009.LDL 101 & HDL 33. No change in diet or activity except ? increased sweets @ work, "oatmeal cokies & cupcakes". No regular CVE. Weight stable. FBS range 124-130; 2 hr post meal < 155. No hypoglycemia.  Hypertension History:      He denies headache, chest pain, palpitations, dyspnea with exertion, orthopnea, PND, peripheral edema, visual symptoms, neurologic problems, syncope, and side effects from treatment.  He notes no problems with any antihypertensive medication side effects.  BP ranges 119/65- 178/82.        Positive major cardiovascular risk factors include male age 62 years old or older, diabetes, hyperlipidemia, and hypertension.  Negative major cardiovascular risk factors include non-tobacco-user status.        Further assessment for target organ damage reveals no history of ASHD, stroke/TIA, or peripheral vascular disease.     Allergies: No Known Drug Allergies  Review of Systems General:  Denies fatigue and weight loss. Eyes:  Denies blurring, double vision, and vision loss-both eyes; No retinopathy  07/30/2009. CV:  Denies leg cramps with exertion, lightheadness, and near fainting. MS:  Denies joint pain, joint redness, and joint swelling; No recent gout. Derm:  Denies poor wound healing. Neuro:  Denies numbness and tingling. Endo:  Denies excessive hunger, excessive thirst, and excessive urination.  Physical Exam  General:   well-nourished; alert,appropriate and cooperative throughout examination Neck:  No deformities, masses, or tenderness noted. Lungs:  Normal respiratory effort, chest expands symmetrically. Lungs are clear to auscultation, no crackles or wheezes. Heart:  Normal rate and regular rhythm. S1 and S2 normal without gallop, murmur, click, rub.S4 with slurring Abdomen:  Bowel sounds positive,abdomen soft and non-tender without masses, organomegaly or hernias noted. Pulses:  R and L carotid,radial,dorsalis pedis and posterior tibial pulses are full and equal bilaterally Extremities:  No clubbing, cyanosis, edema. Good nail & hair growth Neurologic:  alert & oriented X3 and sensation intact to light touch over feet.   Skin:  Intact without suspicious lesions or rashes Psych:  memory intact for recent and remote, normally interactive, and good eye contact.     Impression & Recommendations:  Problem # 1:  DIABETES MELLITUS, TYPE II (ICD-250.00) controlled BUT A1c climbing His updated medication list for this problem includes:    Lisinopril 20 Mg Tabs (Lisinopril) .Marland Kitchen... 2 once daily    Metformin Hcl 500 Mg Xr24h-tab (Metformin hcl) .Marland Kitchen... 1 pill daily  bid with the 2 largest meals  Problem # 2:  HYPERTENSION, ESSENTIAL NOS (ICD-401.9) controlled but survelliance needed His updated medication list for this problem includes:    Carvedilol 25 Mg Tabs (Carvedilol) .Marland Kitchen... 1 by mouth two times a day    Lisinopril 20 Mg Tabs (Lisinopril) .Marland Kitchen... 2 once daily    Spironolactone 25 Mg Tabs (Spironolactone) .Marland Kitchen... 1 once daily  Problem # 3:  HYPERLIPIDEMIA (ICD-272.2) essentially @  goal His updated medication list for this problem includes:    Pravastatin Sodium 40 Mg Tabs (Pravastatin sodium) .Marland Kitchen... Take 1 tab at bedtime  Problem # 4:  GOUT (ICD-274.9)  Complete Medication List: 1)  Pravastatin Sodium 40 Mg Tabs (Pravastatin sodium) .... Take 1 tab at bedtime 2)  Carvedilol 25 Mg Tabs (Carvedilol) .Marland Kitchen.. 1 by  mouth two times a day 3)  Flax Seed  4)  Saw Palmetto  5)  Asa 81mg   6)  Levitra 20 Mg Tabs (Vardenafil hcl) .... 1/2 to 1 once daily as needed 7)  Lisinopril 20 Mg Tabs (Lisinopril) .... 2 once daily 8)  Metformin Hcl 500 Mg Xr24h-tab (Metformin hcl) .Marland Kitchen.. 1 pill daily  bid with the 2 largest meals 9)  Betoptic-s 0.25 % Susp (Betaxolol hcl) .Marland Kitchen.. 1 gtt in each eye two times a day 10)  Vitamin D 1000 Unit Caps (Cholecalciferol) .Marland Kitchen.. 1 by mouth once daily 11)  Spironolactone 25 Mg Tabs (Spironolactone) .Marland Kitchen.. 1 once daily  Hypertension Assessment/Plan:      The patient's hypertensive risk group is category C: Target organ damage and/or diabetes.  His calculated 10 year risk of coronary heart disease is 33 %.  Today's blood pressure is 134/82.    Patient Instructions: 1)  It is important that you exercise regularly at least 20 minutes 5 times a week. If you develop chest pain, have severe difficulty breathing, or feel very tired , stop exercising immediately and seek medical attention.Take an Aspirin every day, @ least 81 mg .Check your blood sugars regularly. If your readings are usually above :150 or below 90  OR > 180  two hrs after a meal you should contact our office.See your eye doctor yearly to check for diabetic eye damage.Check your feet each night for sore areas, calluses or signs of infection.Check your Blood Pressure regularly. If it is above: 140/90 ON AVERAGE (goal = < 135/85 ON AVERAGE)  you should make an appointment. Consume LESS THAN 40 grams of sugar/ day from foods & drinks with High Fructose Corn Syrup as #1,2 or # 3 on label as discussed.Please schedule a follow-up appointment in 6 months.BUN,creat,Lipid Panel ,uric acid,HbgA1C.

## 2010-04-19 NOTE — Progress Notes (Signed)
Summary: -medication change  Phone Note Call from Patient Call back at [phone]   Caller: Patient Call For: Marga Melnick MD Summary of Call: Patient requesting vytorin to be changed to provastatin for lower out of pocket cost. Initial call taken by: Shonna Chock,  May 10, 2009 11:16 AM  Follow-up for Phone Call        Dr.Hopper please advise on change of medication dose, instruction and when labs should be rechecked on new med. Follow-up by: Shonna Chock,  May 10, 2009 1:43 PM  Additional Follow-up for Phone Call Additional follow up Details #1::        Pravastatin 40 mg at bedtime #90(costs $10 @ [facility] or [facility] if insurance NOT used) in place of Vytorin. Fasting lipids, hepatic panel, A1c, BUN,creat,K+; 272.4,995.20,995.20) after 10 weeks with OV 2-3 days later Additional Follow-up by: Marga Melnick MD,  May 11, 2009 5:14 AM    Additional Follow-up for Phone Call Additional follow up Details #2::    left message to call office.............Marland KitchenFelecia Deloach CMA  May 11, 2009 8:12 AM   pt wife aware rx sent to pharmacy, labs schedule................Marland KitchenFelecia Deloach CMA  May 11, 2009 10:43 AM   New/Updated Medications: PRAVASTATIN SODIUM 40 MG TABS (PRAVASTATIN SODIUM) take 1 tab at bedtime Prescriptions: PRAVASTATIN SODIUM 40 MG TABS (PRAVASTATIN SODIUM) take 1 tab at bedtime  #90 x 0   Entered by:   Jeremy Johann CMA   Authorized by:   Marga Melnick MD   Signed by:   Jeremy Johann CMA on 05/11/2009   Method used:   Faxed to ...       [facility]  [phone]* (retail)       [address] [place], [place]  [address]       Ph: [phone] or [phone]       Fax: [phone]   RxID:   [id number]

## 2010-04-19 NOTE — Letter (Signed)
Summary: Primary Care Appointment Letter  [facility]  [address] [place], [place] [address]   Phone: [phone]  Fax: [phone]    09/06/2008 MRN: [id number]  [facility] [address] [place], [place]  [address]  Dear Mr. ELMORE,   Your Primary Care Physician  has indicated that:    __XXXXX_____it is time to schedule an appointment.    _______you missed your appointment on______ and need to call and          reschedule.    _______you need to have lab work done.    _______you need to schedule an appointment discuss lab or test results.    _______you need to call to reschedule your appointment that is                       scheduled on _________.     Please call our office as soon as possible. Our phone number is [phone]________. Please press option 1. Our office is open 8a-12noon and 1p-5p, Monday through Friday.     Thank you,    [facility]

## 2010-04-19 NOTE — Assessment & Plan Note (Signed)
Summary: review labs.cbs   Vital Signs:  Patient profile:   65 year old male Weight:      211.2 pounds Pulse rate:   60 / minute Resp:     16 per minute BP sitting:   140 / 72  (left arm) Cuff size:   large  Vitals Entered By: Shonna Chock (October 02, 2008 11:43 AM) CC: F/U ON LABS Comments REVIEWED MED LIST, PATIENT AGREED DOSE AND INSTRUCTION CORRECT    CC:  F/U ON LABS.  History of Present Illness: BP ranges 135-175/ 70-75. No glucose checks.  labs reviewed : lipids @ goal but A1c up from 6.3 in 11/09 t0 6.5%.  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 Eyes:  Denies blurring, double vision, and vision loss-both eyes; Last exam 3/10 ; no retinopathy. CV:  Denies chest pain or discomfort, lightheadness, and near fainting. GI:  Denies gas and nausea. Derm:  Denies poor wound healing. Neuro:  Denies numbness and tingling; No burning in feet. Endo:  Denies excessive hunger, excessive thirst, excessive urination, and weight change.  Physical Exam  General:  well-nourished,in no acute distress; alert,appropriate and cooperative throughout examination Lungs:  Normal respiratory effort, chest expands symmetrically. Lungs are clear to auscultation, no crackles or wheezes.  Heart:  normal rate, regular rhythm, no gallop, no rub, no JVD, no HJR, and grade 1 /6 systolic murmur.   Abdomen:  Bowel sounds positive,abdomen soft and non-tender without masses, organomegaly or hernias noted. Pulses:  R and L carotid,radial,dorsalis pedis and posterior tibial pulses are full and equal bilaterally Extremities:  No clubbing, cyanosis, edema. Good nail health Neurologic:  alert & oriented X3 and sensation intact to light touch over feet.   Skin:  Intact without suspicious lesions or rashes Psych:  memory intact for recent and remote, normally interactive, and good eye contact.     Impression & Recommendations:  Problem # 1:  DIABETES MELLITUS, TYPE II (ICD-250.00) A1c increasing, not monitoring glucoses @ present, Glucometer teaching completed His updated medication list for this problem includes:    Lisinopril 20 Mg Tabs (Lisinopril) .Marland Kitchen... 2 once daily    Metformin Hcl 500 Mg Xr24h-tab (Metformin hcl) .Marland Kitchen... 1 two times a day  Problem # 2:  HYPERTENSION, ESSENTIAL NOS (ICD-401.9)  His updated medication list for this problem includes:    Carvedilol 25 Mg Tabs (Carvedilol) .Marland Kitchen... 1 by mouth two times a day    Lisinopril 20 Mg Tabs (Lisinopril) .Marland Kitchen... 2 once daily    Spironolactone 25 Mg Tabs (Spironolactone) .Marland Kitchen... 1 once daily  Problem # 3:  HYPERLIPIDEMIA (ICD-272.2) Values @ goal His updated medication list for this problem includes:    Vytorin 10-20 Mg Tabs (Ezetimibe-simvastatin) .Marland Kitchen... 1 qhs  Complete Medication List: 1)  Vytorin 10-20 Mg Tabs (Ezetimibe-simvastatin) .Marland Kitchen.. 1 qhs 2)  Carvedilol 25 Mg Tabs (Carvedilol) .Marland Kitchen.. 1 by mouth two times a day 3)  Flax Seed  4)  Saw Palmetto  5)  Asa 81mg   6)  Levitra 20 Mg Tabs (Vardenafil hcl) .... 1/2 to 1 once daily as needed 7)  Lisinopril 20 Mg Tabs (Lisinopril) .... 2 once daily 8)  Metformin Hcl 500 Mg Xr24h-tab (Metformin hcl) .Marland Kitchen.. 1 two times a day 9)  Betoptic-s 0.25 % Susp (Betaxolol hcl) .Marland Kitchen.. 1 gtt in each  eye two times a day 10)  Vitamin D 1000 Unit Caps (Cholecalciferol) .Marland Kitchen.. 1 by mouth once daily 11)  Spironolactone 25 Mg Tabs (Spironolactone) .Marland Kitchen.. 1 once daily  Patient Instructions: 1)  Low sugar index (spike) & load program ( [employer]) 2)  Please schedule a follow-up appointment in 3 months. 3)  BUN,creat,K+ 401.9; 4)  HbgA1C 250.00. 5)  Check your Blood Pressure regularly. If it is above: 135/85 ON AVERAGE  you should make an appointment. 6)  Check your blood sugars regularly. If your readings are usually above :130 or below 70 you should contact our office. Goal 2 hrs post meal = < 180,ideally < 160. Prescriptions: METFORMIN HCL 500 MG  XR24H-TAB (METFORMIN HCL) 1 two times a day  #180 x 1   Entered and Authorized by:   Marga Melnick MD   Signed by:   Marga Melnick MD on 10/02/2008   Method used:   Print then Give to Patient   RxID:   [id number] SPIRONOLACTONE 25 MG TABS (SPIRONOLACTONE) 1 once daily  #90 x 1   Entered and Authorized by:   Marga Melnick MD   Signed by:   Marga Melnick MD on 10/02/2008   Method used:   Print then Give to Patient   RxID:   [id number] LISINOPRIL 20 MG  TABS (LISINOPRIL) 2 once daily  #180 x 1   Entered and Authorized by:   Marga Melnick MD   Signed by:   Marga Melnick MD on 10/02/2008   Method used:   Print then Give to Patient   RxID:   [id number] CARVEDILOL 25 MG  TABS (CARVEDILOL) 1 by mouth two times a day  #180 x 3   Entered and Authorized by:   Marga Melnick MD   Signed by:   Marga Melnick MD on 10/02/2008   Method used:   Print then Give to Patient   RxID:   [id number] VYTORIN 10-20 MG  TABS (EZETIMIBE-SIMVASTATIN) 1 qhs  #90 x 3   Entered and Authorized by:   Marga Melnick MD   Signed by:   Marga Melnick MD on 10/02/2008   Method used:   Print then Give to Patient   RxID:   [id number]

## 2010-04-19 NOTE — Progress Notes (Signed)
Summary: MRI order  Phone Note Call from Patient   Caller: Patient Summary of Call: Pt was seen at chiropractor today who suggest Pt have a MRI with contrast of the lumbar spine.Marland KitchenMarland KitchenPls advise...Marland KitchenMarland KitchenFelecia Deloach CMA  February 25, 2010 2:37 PM   Follow-up for Phone Call        I would need his office notes for documentation; it is getting hard to get insurance co to authorize MRIs. Diagnosis & written documentation will be requested by ins co Follow-up by: Marga Melnick MD,  February 25, 2010 4:50 PM  Additional Follow-up for Phone Call Additional follow up Details #1::        Patient notified. Additional Follow-up by: Lucious Groves CMA,  February 25, 2010 4:54 PM

## 2010-04-21 NOTE — Progress Notes (Signed)
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 Either Neurosurgical or Back Specialist (there is one in Dr Jeannetta Ellis group) referral recommended. Epidural steroid injections most likely therapy. Levester Fresh CMA  March 07, 2010 4:51 PM   Follow-up for Phone Call        Patient's wife called back and indicated she would like to have referral but to [facility] Follow-up by: Shonna Chock CMA,  March 08, 2010 7:56 AM

## 2010-04-21 NOTE — Letter (Signed)
Summary: [facility]  [facility]   Imported By: Lanelle Bal 03/15/2010 08:45:42  _____________________________________________________________________  External Attachment:    Type:   Image     Comment:   External Document

## 2010-04-21 NOTE — Assessment & Plan Note (Signed)
Summary: DISCUSS MRI/KB   Vital Signs:  Patient profile:   65 year old male Weight:      211.4 pounds BMI:     30.66 Temp:     98.0 degrees F oral Pulse rate:   72 / minute Resp:     16 per minute BP sitting:   140 / 90  (left arm) Cuff size:   large  Vitals Entered By: Shonna Chock CMA (March 03, 2010 8:13 AM) CC: Discuss getting order for MRI , Back pain, Type 2 diabetes mellitus follow-up   CC:  Discuss getting order for MRI , Back pain, and Type 2 diabetes mellitus follow-up.  History of Present Illness: Back Pain      This is a 65 year old man who presents with Back pain, present for years , but worse over past 4 weeks w/o injury. Dr.Rubin ,Chiropracter , suggested MRI due to no explanation on films & lack of response to 3 treatments.  The patient denies fever, chills, weakness, loss of sensation, fecal incontinence, urinary incontinence, urinary retention, and dysuria.  The pain is located in the mid low back.  The pain progression  has been  gradual.  The pain radiates to  both buttocks & to the left posterior thigh above  the knee.  The pain is made worse by standing  and extension.  The pain is made better by inactivity. Tylenol & Aleve did not help. Risk factors for serious underlying conditions include duration of pain > 1 month.  PMH of LS surgery for  ruptured  disc by Dr Darrelyn Hillock in 2004. Type 2 Diabetes Mellitus Follow-Up      The patient denies polyuria, polydipsia, blurred vision, self managed hypoglycemia, weight loss, weight gain, and numbness of extremities.  The patient denies the following symptoms: neuropathic pain, chest pain, vomiting, orthostatic symptoms, intermittent claudication, and vision loss.  Since the last visit the patient reports monitoring blood glucose, but he is unable to state ranges or average..    Current Medications (verified): 1)  Pravastatin Sodium 40 Mg Tabs (Pravastatin Sodium) .... Take 1 Tab At Bedtime 2)  Carvedilol 25 Mg  Tabs  (Carvedilol) .Marland Kitchen.. 1 By Mouth Two Times A Day 3)  Flax Seed 4)  Saw Palmetto 5)  Asa 81mg  6)  Levitra 20 Mg  Tabs (Vardenafil Hcl) .... 1/2 To 1 Once Daily As Needed 7)  Lisinopril 20 Mg  Tabs (Lisinopril) .... 2 Once Daily 8)  Metformin Hcl 500 Mg  Xr24h-Tab (Metformin Hcl) .Marland Kitchen.. 1 Pill Daily  Bid With The 2 Largest Meals 9)  Betoptic-S 0.25 % Susp (Betaxolol Hcl) .Marland Kitchen.. 1 Gtt in Each Eye Two Times A Day 10)  Vitamin D 1000 Unit Caps (Cholecalciferol) .Marland Kitchen.. 1 By Mouth Once Daily 11)  Spironolactone 25 Mg Tabs (Spironolactone) .Marland Kitchen.. 1 Once Daily  Allergies (verified): No Known Drug Allergies  Review of Systems MS:  Complains of joint pain; Gout flare  L great toe 2 months ago treated @ [facility].Lipids were done with other labs; "they took 5 vials of blood".  Physical Exam  General:  well-nourished,in no acute distress; alert,appropriate and cooperative throughout examination Abdomen:  Bowel sounds positive,abdomen soft and non-tender without masses, organomegaly or hernias noted. No AAA Msk:  No pain to percussion LS spine but pain with SLR. Op scar well healed. He lay down & sat up w/o help. Pulses:  R and L dorsalis pedis and posterior tibial pulses are full and equal bilaterally Extremities:  No clubbing,  cyanosis, edema  noted with normal full range of motion of all joints.  Minor DIP OA changes Neurologic:  alert & oriented X3, strength normal in all extremities, gait abnormal with slight foot drop on L.He can walk on toes. DTRs  minimally assymmetrical ( slightly decreased R knee) .   Sensation normal over feet to light touch. Skin:  Intact without suspicious lesions or rashes Cervical Nodes:  No lymphadenopathy noted Axillary Nodes:  No palpable lymphadenopathy   Impression & Recommendations:  Problem # 1:  LOW BACK PAIN, ACUTE (ICD-724.2)  acute over 4 weeks superimposed on chronic LS pain. R/O Spinal Stenosis  Orders: Radiology Referral (Radiology)  Problem # 2:  DIABETES  MELLITUS, TYPE II (ICD-250.00)  ? control His updated medication list for this problem includes:    Lisinopril 20 Mg Tabs (Lisinopril) .Marland Kitchen... 2 once daily    Metformin Hcl 500 Mg Xr24h-tab (Metformin hcl) .Marland Kitchen... 1 pill daily  bid with the 2 largest meals  Orders: Venipuncture (14782) TLB-A1C / Hgb A1C (Glycohemoglobin) (83036-A1C) TLB-Microalbumin/Creat Ratio, Urine (82043-MALB)  Complete Medication List: 1)  Pravastatin Sodium 40 Mg Tabs (Pravastatin sodium) .... Take 1 tab at bedtime 2)  Carvedilol 25 Mg Tabs (Carvedilol) .Marland Kitchen.. 1 by mouth two times a day 3)  Flax Seed  4)  Saw Palmetto  5)  Asa 81mg   6)  Levitra 20 Mg Tabs (Vardenafil hcl) .... 1/2 to 1 once daily as needed 7)  Lisinopril 20 Mg Tabs (Lisinopril) .... 2 once daily 8)  Metformin Hcl 500 Mg Xr24h-tab (Metformin hcl) .Marland Kitchen.. 1 pill daily  bid with the 2 largest meals 9)  Betoptic-s 0.25 % Susp (Betaxolol hcl) .Marland Kitchen.. 1 gtt in each eye two times a day 10)  Vitamin D 1000 Unit Caps (Cholecalciferol) .Marland Kitchen.. 1 by mouth once daily 11)  Spironolactone 25 Mg Tabs (Spironolactone) .Marland Kitchen.. 1 once daily  Patient Instructions: 1)  Check your blood sugars regularly. If you FASTING (am)  readings are usually above : 150 or below 90  OR > 180 two hrs after largest meal you should contact our office. 2)  See your eye doctor yearly to check for diabetic eye damage. 3)  Check your feet each night for sore areas, calluses or signs of infection.   Orders Added: 1)  Est. Patient Level IV [95621] 2)  Venipuncture [36415] 3)  TLB-A1C / Hgb A1C (Glycohemoglobin) [83036-A1C] 4)  TLB-Microalbumin/Creat Ratio, Urine [82043-MALB] 5)  Radiology Referral [Radiology]  Appended Document: DISCUSS MRI/KB

## 2010-04-21 NOTE — Letter (Signed)
Summary: [facility]  [facility]   Imported By: Lanelle Bal 04/06/2010 14:49:01  _____________________________________________________________________  External Attachment:    Type:   Image     Comment:   External Document

## 2010-04-21 NOTE — Progress Notes (Signed)
Summary: Info rec'd?  Phone Note Call from Patient Call back at Home Phone [phone]   Summary of Call: Patient spouse left message on triage asking if we received information for patient to have MRI. Lucious Groves CMA  March 02, 2010 3:06 PM   Follow-up for Phone Call        I received report , but it doesn't state why  MRI needed . The   hand written notes would not be adequate  documentation for the insurance company based on my recent interactions ( unpleasant)  with them . I recommend referral to Dr Ethelene Hal , a non surgical back specialist or I can see him & try to produce a  typed record trying to justify the MRI. Follow-up by: Marga Melnick MD,  March 02, 2010 3:47 PM  Additional Follow-up for Phone Call Additional follow up Details #1::        Patient spouse notified and will bring him tomorrow. Additional Follow-up by: Lucious Groves CMA,  March 02, 2010 4:20 PM

## 2010-05-05 NOTE — Letter (Signed)
Summary: [facility]  [facility]   Imported By: Sherian Rein 04/26/2010 10:11:49  _____________________________________________________________________  External Attachment:    Type:   Image     Comment:   External Document

## 2010-05-26 ENCOUNTER — Encounter: Payer: Self-pay | Admitting: Internal Medicine

## 2010-06-30 LAB — DIFFERENTIAL
Basophils Absolute: 0 10*3/uL (ref 0.0–0.1)
Basophils Relative: 1 % (ref 0–1)
Eosinophils Absolute: 0.2 10*3/uL (ref 0.0–0.7)
Eosinophils Relative: 3 % (ref 0–5)
Lymphocytes Relative: 27 % (ref 12–46)
Lymphs Abs: 2 10*3/uL (ref 0.7–4.0)
Monocytes Absolute: 0.5 10*3/uL (ref 0.1–1.0)
Monocytes Relative: 7 % (ref 3–12)
Neutro Abs: 4.6 10*3/uL (ref 1.7–7.7)
Neutrophils Relative %: 63 % (ref 43–77)

## 2010-06-30 LAB — URINE CULTURE
Colony Count: NO GROWTH
Culture: NO GROWTH
Special Requests: NEGATIVE

## 2010-06-30 LAB — COMPREHENSIVE METABOLIC PANEL
ALT: 36 U/L (ref 0–53)
AST: 31 U/L (ref 0–37)
Albumin: 3.6 g/dL (ref 3.5–5.2)
Alkaline Phosphatase: 58 U/L (ref 39–117)
BUN: 12 mg/dL (ref 6–23)
CO2: 24 mEq/L (ref 19–32)
Calcium: 9 mg/dL (ref 8.4–10.5)
Chloride: 104 mEq/L (ref 96–112)
Creatinine, Ser: 0.91 mg/dL (ref 0.4–1.5)
GFR calc Af Amer: >60 mL/min (ref >60)
GFR calc non Af Amer: >60 mL/min (ref >60)
Glucose, Bld: 184 mg/dL — ABNORMAL HIGH (ref 70–99)
Potassium: 3.8 mEq/L (ref 3.5–5.1)
Sodium: 141 mEq/L (ref 135–145)
Total Bilirubin: 0.6 mg/dL (ref 0.3–1.2)
Total Protein: 6.6 g/dL (ref 6.0–8.3)

## 2010-06-30 LAB — ABO/RH: ABO/RH(D): B POS

## 2010-06-30 LAB — URINALYSIS, ROUTINE W REFLEX MICROSCOPIC
Bilirubin Urine: NEGATIVE
Glucose, UA: NEGATIVE mg/dL
Hgb urine dipstick: NEGATIVE
Ketones, ur: NEGATIVE mg/dL
Nitrite: NEGATIVE
Protein, ur: NEGATIVE mg/dL
Specific Gravity, Urine: 1.023 (ref 1.005–1.030)
Urobilinogen, UA: 1 mg/dL (ref 0.0–1.0)
pH: 6 (ref 5.0–8.0)

## 2010-06-30 LAB — CBC
HCT: 39.8 % (ref 39.0–52.0)
Hemoglobin: 13.4 g/dL (ref 13.0–17.0)
MCHC: 33.6 g/dL (ref 30.0–36.0)
MCV: 87.7 fL (ref 78.0–100.0)
Platelets: 224 10*3/uL (ref 150–400)
RBC: 4.54 MIL/uL (ref 4.22–5.81)
RDW: 13.6 % (ref 11.5–15.5)
WBC: 7.4 10*3/uL (ref 4.0–10.5)

## 2010-06-30 LAB — GLUCOSE, CAPILLARY
Glucose-Capillary: 110 mg/dL — ABNORMAL HIGH (ref 70–99)
Glucose-Capillary: 121 mg/dL — ABNORMAL HIGH (ref 70–99)

## 2010-06-30 LAB — TYPE AND SCREEN
ABO/RH(D): B POS
Antibody Screen: NEGATIVE

## 2010-06-30 LAB — PROTIME-INR
INR: 0.9 (ref 0.00–1.49)
Prothrombin Time: 12.6 seconds (ref 11.6–15.2)

## 2010-06-30 LAB — APTT: aPTT: 30 seconds (ref 24–37)

## 2010-07-11 ENCOUNTER — Other Ambulatory Visit: Payer: Self-pay | Admitting: *Deleted

## 2010-07-11 MED ORDER — GLUCOSE BLOOD VI STRP: ORAL_STRIP | Status: DC

## 2010-07-20 ENCOUNTER — Telehealth: Payer: Self-pay | Admitting: Internal Medicine

## 2010-07-20 ENCOUNTER — Other Ambulatory Visit: Payer: Self-pay

## 2010-07-20 MED ORDER — INDOMETHACIN 50 MG PO CAPS: 50.0000 mg | ORAL_CAPSULE | Freq: Two times a day (BID) | ORAL | Status: DC | PRN

## 2010-07-20 NOTE — Telephone Encounter (Signed)
I called the pharmacy (spoke with Latoya)  to see if rx was received, rx was sent to them earlier today. Latoya indicated rx is ready as of 11 am today and she apologizes that patient was mis-informed.  I called patient and spoke with his wife informing her rx available since 78 am

## 2010-07-20 NOTE — Telephone Encounter (Signed)
Patient placed order for Indomethacin at [facility], [address], [place] and they said they have not heard back from us--please send prescription

## 2010-08-02 NOTE — Op Note (Signed)
NAMEJAEGER, TRUEHEART NO.:  [id number]   MEDICAL RECORD NO.:  [id number]          PATIENT TYPE:  AMB   LOCATION:  DAY                          FACILITY:  [facility]   PHYSICIAN:  Ronald A. Gioffre, M.D.DATE OF BIRTH:  [birth year]   DATE OF PROCEDURE:  06/10/2008  DATE OF DISCHARGE:                               OPERATIVE REPORT   SURGEON:  Dr. Georges Lynch. Darrelyn Hillock, M.D.   ASSISTANT:  Nurse tech   PREOPERATIVE DIAGNOSIS:  1. A complete tear of the rotator cuff tendon right shoulder.  2. Severe impingement right shoulder.   POSTOPERATIVE DIAGNOSIS:  1. A complete tear of the rotator cuff tendon right shoulder.  2. Severe impingement right shoulder.   OPERATION:  1. Open acromionectomy right shoulder.  2. Repair of the torn rotator cuff tendon right shoulder primarily.  3. TissueMend graft with one anchor for repair of the rotator cuff      right shoulder.   PROCEDURE:  Under general anesthesia routine orthopedic prep and  draping, right shoulder was carried out.  He did not have interscalene  nerve block. He had 2 grams of IV Ancef preop.  An incision was made  over the anterior aspect of the right shoulder.  Bleeders identified and  cauterized.  I inserted self-retaining retractors.  I went down and  partially detached deltoid tendon of the acromion in usual fashion.  I  split the very proximal part of the muscle.  Following that I went down  and excised the subdeltoid bursa which was quite thickened and inflamed.  He had a large tear anteriorly at the insertion of the rotator cuff.  The biceps was inspected.  It was somewhat frayed.  I then retracted the  torn portions of the rotator cuff and utilized the bur to bur the  articular surface of the humerus.  Prior to doing that I did an open  acromionectomy and acromioplasty of the acromion to reestablish the  subacromial space.  I thoroughly irrigated out the area and then bone  waxed the undersurface of the  acromion.  Following that I then did a  primary repair of the tendon with reinforcement of the tendon repair  with the TissueMend graft with one anchor.  We had a nice repair.  I had  to utilize the proximal part of the biceps tendon to help close the gap  in the tendon area of the tendon tear.  I thoroughly irrigated out the  area, reapproximated the deltoid tendon and muscle in usual fashion.  The subcu was closed with 0 Vicryl, skin was closed metal staples.  I  injected about 13 cc of  0.5%  Marcaine with epinephrine in the wound  site.  Sterile Neosporin, bundle dressing was applied.  He was placed in  a shoulder immobilizer.           ______________________________  Georges Lynch Darrelyn Hillock, M.D.    RAG/MEDQ  D:  06/10/2008  T:  06/10/2008  Job:  [id number]

## 2010-08-05 NOTE — Op Note (Signed)
NAMEGAVRIEL, Donald Carlson                       ACCOUNT NO.:  [id number]   MEDICAL RECORD NO.:  [id number]                   PATIENT TYPE:  AMB   LOCATION:  DAY                                  FACILITY:  [facility]   PHYSICIAN:  Ronald A. Darrelyn Hillock, M.D.             DATE OF BIRTH:  1945-11-10   DATE OF PROCEDURE:  05/30/2002  DATE OF DISCHARGE:                                 OPERATIVE REPORT   SURGEON:  Georges Lynch. Darrelyn Hillock, M.D.   ASSISTANT:  Ronnell Guadalajara, M.D.   PREOPERATIVE DIAGNOSES:  1. Severe spinal stenosis at L4-5.  2. Large herniated disk at L4-5, central on the left with a partial foot     drop on the left.   POSTOPERATIVE DIAGNOSES:  1. Severe spinal stenosis at L4-5.  2. Large herniated disk at L4-5, central on the left with a partial foot     drop on the left.   OPERATION:  1. Bilateral central type lumbar laminectomy for spinal stenosis.  2. Microdiskectomy at L4-5 on the left.   DESCRIPTION OF PROCEDURE:  Under general anesthesia, with the patient in a  spinal frame, a routine orthopedic prep and draping of the lower back was  carried out.  He had 1 g of IV Ancef.  Two needles were placed in the back  for localization purposes.  X-ray was taken to verify our position.  An  incision was made over the L4-5 interspace.  Bleeders identified and  cauterized, and then the [facility] retractors were inserted after we  stripped the muscle from the lamina and the spinous processes.  At this  time, I removed the spinous process of L4 after a second x-ray was taken to  verify our position.  I then went down and did a central lumbar laminectomy  at L4-5 and preserved his facets.  I did decompressions of the lateral  recesses bilaterally.  We removed the ligamentum flavum.  Great care was  taken not to injure the dura.  We then brought in the microscope, identified  the nerve roots, and then gently retracted a root on the left where the  large herniated disk was.  We utilized  the bipolar to cauterize the lateral  recess vein.  A cruciate incision was made in the posterior longitudinal  ligament, and a large amount of disk extruded from the space under pressure.  We then inserted the nerve hook and the Epstein curette and cleaned out the  entire subligamentous space as well as the disk space.  We thoroughly  removed the disk, nicely decompressed the nerve, did a foraminotomy, and  then checked the nerve with a hockey-stick, and we had a good amount of  space for the nerve to pass through.  Thoroughly irrigated out the area and  loosely applied some thrombin-soaked Gelfoam, closed the wound in layers in  the usual fashion over a quarter-inch Penrose drain, and sterile dressings  were  applied.  The patient left the operating room in satisfactory  condition.                                               Ronald A. Darrelyn Hillock, M.D.    RAG/MEDQ  D:  05/30/2002  T:  05/30/2002  Job:  [id number]

## 2010-08-16 ENCOUNTER — Other Ambulatory Visit: Payer: Self-pay | Admitting: Internal Medicine

## 2010-08-16 MED ORDER — SPIRONOLACTONE 25 MG PO TABS: 25.0000 mg | ORAL_TABLET | Freq: Every day | ORAL | Status: DC

## 2010-08-16 NOTE — Telephone Encounter (Signed)
RX sent to pharmacy  

## 2010-08-19 ENCOUNTER — Other Ambulatory Visit: Payer: Self-pay

## 2010-08-19 MED ORDER — INDOMETHACIN 50 MG PO CAPS: 50.0000 mg | ORAL_CAPSULE | Freq: Two times a day (BID) | ORAL | Status: DC | PRN

## 2010-08-26 ENCOUNTER — Telehealth: Payer: Self-pay | Admitting: Internal Medicine

## 2010-08-26 ENCOUNTER — Ambulatory Visit (INDEPENDENT_AMBULATORY_CARE_PROVIDER_SITE_OTHER): Payer: Medicare Other | Admitting: Internal Medicine

## 2010-08-26 ENCOUNTER — Encounter: Payer: Self-pay | Admitting: Internal Medicine

## 2010-08-26 DIAGNOSIS — I10 Essential (primary) hypertension: Secondary | ICD-10-CM

## 2010-08-26 DIAGNOSIS — E782 Mixed hyperlipidemia: Secondary | ICD-10-CM

## 2010-08-26 DIAGNOSIS — E119 Type 2 diabetes mellitus without complications: Secondary | ICD-10-CM

## 2010-08-26 DIAGNOSIS — M109 Gout, unspecified: Secondary | ICD-10-CM

## 2010-08-26 LAB — HEPATIC FUNCTION PANEL
ALT: 27 U/L (ref 0–53)
AST: 29 U/L (ref 0–37)
Albumin: 4.1 g/dL (ref 3.5–5.2)
Alkaline Phosphatase: 62 U/L (ref 39–117)
Bilirubin, Direct: 0.1 mg/dL (ref 0.0–0.3)
Total Bilirubin: 0.4 mg/dL (ref 0.3–1.2)
Total Protein: 7.3 g/dL (ref 6.0–8.3)

## 2010-08-26 LAB — LIPID PANEL
Cholesterol: 162 mg/dL (ref 0–200)
HDL: 34.6 mg/dL — ABNORMAL LOW (ref >39.00)
LDL Cholesterol: 98 mg/dL (ref 0–99)
Total CHOL/HDL Ratio: 5
Triglycerides: 147 mg/dL (ref 0.0–149.0)
VLDL: 29.4 mg/dL (ref 0.0–40.0)

## 2010-08-26 LAB — URIC ACID: Uric Acid, Serum: 9.1 mg/dL — ABNORMAL HIGH (ref 4.0–7.8)

## 2010-08-26 LAB — BASIC METABOLIC PANEL
BUN: 27 mg/dL — ABNORMAL HIGH (ref 6–23)
CO2: 28 mEq/L (ref 19–32)
Calcium: 9.4 mg/dL (ref 8.4–10.5)
Chloride: 109 mEq/L (ref 96–112)
Creatinine, Ser: 1.4 mg/dL (ref 0.4–1.5)
GFR: 54.86 mL/min — ABNORMAL LOW (ref >60.00)
Glucose, Bld: 113 mg/dL — ABNORMAL HIGH (ref 70–99)
Potassium: 5.2 mEq/L — ABNORMAL HIGH (ref 3.5–5.1)
Sodium: 140 mEq/L (ref 135–145)

## 2010-08-26 LAB — TSH: TSH: 1.6 u[IU]/mL (ref 0.35–5.50)

## 2010-08-26 LAB — MICROALBUMIN / CREATININE URINE RATIO
Creatinine,U: 107.9 mg/dL
Microalb Creat Ratio: 0.6 mg/g (ref 0.0–30.0)
Microalb, Ur: 0.7 mg/dL (ref 0.0–1.9)

## 2010-08-26 LAB — HEMOGLOBIN A1C: Hgb A1c MFr Bld: 6.9 % — ABNORMAL HIGH (ref 4.6–6.5)

## 2010-08-26 NOTE — Assessment & Plan Note (Signed)
Plan: LDL goal = < 100, ideally < 70. 

## 2010-08-26 NOTE — Assessment & Plan Note (Signed)
Plan: goal = AVERAGE <135/85

## 2010-08-26 NOTE — Assessment & Plan Note (Signed)
Plan: A1c, microalb

## 2010-08-26 NOTE — Progress Notes (Signed)
Subjective:    Patient ID: Donald Carlson, male    DOB: Feb 19, 1946, 65 y.o.   MRN: [id number]  HPI #1HYPERTENSION Disease Monitoring  Blood pressure range: 83/54- 189/104; average not monitored  Chest pain: no   Dyspnea: no   Claudication: no  Medication compliance: yes Medication Side Effects  Lightheadedness: yes, one day only , positionally related   Urinary frequency: no   Edema: no  Except sock ridge @ end of day [employer]:  Exercise: yes, calisthentics Rxed after back surgery in 01/12 , Dr Jeral Fruit  Diet Pattern: no  Salt Restriction: yes   #2Diabetes status assessment: Fasting or morning glucose range:  98- 175 or average :  Not checked  . Highest glucose 2 hours after any meal:  <  130. Hypoglycemia :  no .                                                     Excess thirst :no;  Excess hunger:  no ;  Excess urination:  no.                                  Lightheadedness with standing:  See above (isolated event). Palpitations :no .                                                                                                                                 Non healing skin  ulcers or sores,especially over the feet:  no. 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                                                                                                                                              Significant change in  Weight : no. Vision changes : rare blurred vision  .  Medication compliance : yes. Medication adverse  Effects:  no . Eye exam : 4/12, no retinopathy. Foot care : no.  A1c/ urine microalbumin monitor:  due    #3 Dyslipidemia assessment: Family history of premature CAD/ MI: Mother MI @ 32; bro MI @ 18 .  Smoking history  : never .   Weight :  stable. ROS: fatigue: no ;abd pain/bowel changes: no ;   syncope : no ; memory loss: no;skin  changes: no.  Review of Systems Some drift to L with walking in past 3 weeks. No myalgias on statin. Gout flare 2 weeks ago     Objective:   Physical Exam Gen.: Healthy and well-nourished in appearance. Alert, appropriate and cooperative throughout exam. Eyes: No corneal or conjunctival inflammation noted. Neck: No deformities, masses, or tenderness noted. Range of motion  & . Thyroid  normal. Lungs: Normal respiratory effort; chest expands symmetrically. Lungs are clear to auscultation without rales, wheezes, or increased work of breathing. Heart: Slow rate and reg  rhythm. Normal S1 and S2. No gallop, click, or rub. No  murmur. Abdomen: Bowel sounds normal; abdomen soft and nontender. No masses, organomegaly or hernias noted. Musculoskeletal/extremities: No deformity or scoliosis noted of  the thoracic or lumbar spine. No clubbing, cyanosis, edema, or deformity noted. He lay down & sat w/o help..Tone & strength  normal.Joints normal. Nail health  good. Vascular: Carotid, radial artery, dorsalis pedis and dorsalis posterior tibial pulses are full and equal. No bruits present. Neurologic: Alert and oriented x3. Deep tendon reflexes symmetrical and normal.Light touch normal over feet. Gait: slight foot L foot.          Skin: Intact without suspicious lesions or rashes. Psych: Mood and affect are normal. Normally interactive                                                                                         Assessment & Plan:  See Problem List & Assessments Plan: see Orders

## 2010-08-26 NOTE — Patient Instructions (Signed)
The minimal goal to prevent gout is a  uric acid  value  less than 7 .Preferred is less than 6 and ideal is less than 5 .  Depending on the type of diabetes that you have, how well your diabetes is controlled, your A1c may be measured 2 to 4 times each year. When someone is first diagnosed with diabetes or if control is not good, A1c may be ordered more frequently.   NORMAL VALUES  Non diabetic adults: 5 %-6.1%  Good diabetic control: 6.2-6.4 %  Fair diabetic control: 6.5-7%  Poor diabetic control: greater than 7 % ( except with additional factors such as  advanced age; significant coronary or neurologic disease,etc). Check the A1c every 6 months if it is < 6.5%; every 4 months if  6.5% or higher. Goals for home glucose monitoring are : fasting  or morning glucose goal of  90-150. Two hours after any meal , goal = < 180, preferably < 150.     Exercise at least 30-45 minutes a day,  3-4 days a week.  Eat a low-fat diet with lots of fruits and vegetables, up to 7-9 servings per day. Consume less than 40 grams of sugar per day from foods & drinks with High Fructose Corn Sugar as #2,3 or # 4 on label. Eye Doctor - have an eye exam @ least annually.

## 2010-08-26 NOTE — Telephone Encounter (Signed)
Change made.

## 2010-08-26 NOTE — Assessment & Plan Note (Signed)
Plan: uric acid goal = < 6, ideally < 5

## 2010-08-30 ENCOUNTER — Other Ambulatory Visit: Payer: Self-pay | Admitting: Internal Medicine

## 2010-08-30 MED ORDER — PRAVASTATIN SODIUM 40 MG PO TABS: 40.0000 mg | ORAL_TABLET | Freq: Every day | ORAL | Status: DC

## 2010-08-30 NOTE — Telephone Encounter (Signed)
rx sent to pharmacy

## 2010-08-31 ENCOUNTER — Other Ambulatory Visit: Payer: Self-pay

## 2010-08-31 MED ORDER — ALLOPURINOL 100 MG PO TABS: 100.0000 mg | ORAL_TABLET | Freq: Every day | ORAL | Status: DC

## 2010-09-22 ENCOUNTER — Emergency Department (HOSPITAL_COMMUNITY): Payer: Medicare Other

## 2010-09-22 ENCOUNTER — Emergency Department (HOSPITAL_COMMUNITY)
Admission: EM | Admit: 2010-09-22 | Discharge: 2010-09-22 | Disposition: A | Discharge status: Home / Self Care | Payer: Medicare Other | Attending: Emergency Medicine | Admitting: Emergency Medicine

## 2010-09-22 DIAGNOSIS — E119 Type 2 diabetes mellitus without complications: Secondary | ICD-10-CM | POA: Insufficient documentation

## 2010-09-22 DIAGNOSIS — M109 Gout, unspecified: Secondary | ICD-10-CM | POA: Insufficient documentation

## 2010-09-22 DIAGNOSIS — R112 Nausea with vomiting, unspecified: Secondary | ICD-10-CM | POA: Insufficient documentation

## 2010-09-22 DIAGNOSIS — I10 Essential (primary) hypertension: Secondary | ICD-10-CM | POA: Insufficient documentation

## 2010-09-22 DIAGNOSIS — E7800 Pure hypercholesterolemia, unspecified: Secondary | ICD-10-CM | POA: Insufficient documentation

## 2010-09-22 DIAGNOSIS — R109 Unspecified abdominal pain: Secondary | ICD-10-CM | POA: Insufficient documentation

## 2010-09-22 DIAGNOSIS — N200 Calculus of kidney: Secondary | ICD-10-CM | POA: Insufficient documentation

## 2010-09-22 LAB — POCT I-STAT, CHEM 8
BUN: 40 mg/dL — ABNORMAL HIGH (ref 6–23)
Calcium, Ion: 1.2 mmol/L (ref 1.12–1.32)
Chloride: 108 mEq/L (ref 96–112)
Creatinine, Ser: 1.7 mg/dL — ABNORMAL HIGH (ref 0.50–1.35)
Glucose, Bld: 143 mg/dL — ABNORMAL HIGH (ref 70–99)
HCT: 41 % (ref 39.0–52.0)
Hemoglobin: 13.9 g/dL (ref 13.0–17.0)
Potassium: 4.5 mEq/L (ref 3.5–5.1)
Sodium: 139 mEq/L (ref 135–145)
TCO2: 24 mmol/L (ref 0–100)

## 2010-09-22 LAB — URINE MICROSCOPIC-ADD ON

## 2010-09-22 LAB — CBC
HCT: 38.8 % — ABNORMAL LOW (ref 39.0–52.0)
Hemoglobin: 12.7 g/dL — ABNORMAL LOW (ref 13.0–17.0)
MCH: 28 pg (ref 26.0–34.0)
MCHC: 32.7 g/dL (ref 30.0–36.0)
MCV: 85.7 fL (ref 78.0–100.0)
Platelets: 200 10*3/uL (ref 150–400)
RBC: 4.53 MIL/uL (ref 4.22–5.81)
RDW: 13.3 % (ref 11.5–15.5)
WBC: 8.5 10*3/uL (ref 4.0–10.5)

## 2010-09-22 LAB — URINALYSIS, ROUTINE W REFLEX MICROSCOPIC
Glucose, UA: NEGATIVE mg/dL
Ketones, ur: NEGATIVE mg/dL
Leukocytes, UA: NEGATIVE
Nitrite: NEGATIVE
Protein, ur: 30 mg/dL — AB
Specific Gravity, Urine: 1.028 (ref 1.005–1.030)
Urobilinogen, UA: 0.2 mg/dL (ref 0.0–1.0)
pH: 5.5 (ref 5.0–8.0)

## 2010-09-22 LAB — GLUCOSE, CAPILLARY: Glucose-Capillary: 135 mg/dL — ABNORMAL HIGH (ref 70–99)

## 2010-09-23 LAB — URINE CULTURE
Colony Count: NO GROWTH
Culture  Setup Time: 201207051402
Culture: NO GROWTH

## 2010-09-26 ENCOUNTER — Ambulatory Visit (INDEPENDENT_AMBULATORY_CARE_PROVIDER_SITE_OTHER): Payer: Medicare Other | Admitting: Internal Medicine

## 2010-09-26 ENCOUNTER — Encounter: Payer: Self-pay | Admitting: Internal Medicine

## 2010-09-26 VITALS — BP 124/88 | HR 57 | Temp 98.5°F | Wt 207.6 lb

## 2010-09-26 DIAGNOSIS — N289 Disorder of kidney and ureter, unspecified: Secondary | ICD-10-CM

## 2010-09-26 DIAGNOSIS — I10 Essential (primary) hypertension: Secondary | ICD-10-CM

## 2010-09-26 DIAGNOSIS — E119 Type 2 diabetes mellitus without complications: Secondary | ICD-10-CM

## 2010-09-26 DIAGNOSIS — M109 Gout, unspecified: Secondary | ICD-10-CM

## 2010-09-26 NOTE — Progress Notes (Signed)
Subjective:    Patient ID: Donald Carlson, male    DOB: Mar 26, 1945, 65 y.o.   MRN: [id number]  HPI He was seen in the emergency room early 09/22/2010 for kidney stone. CT scan revealed a nonobstructing 4 mm stone the right ureter at the level of L4-5. He had dilation of the proximal ureteral and collecting system with some stranding around the right kidney. Simple cysts are suggested in both kidneys.Urine revealed large amount of hemoglobin. Culture revealed no growth. His creatinine was 1.7; it in and 1.4 in June of this year.Since emergent visit he has had minimal pain. Significantly he has been on allopurinol, indomethacin, metformin and spironolactone.  His father died of uremic poisoning; he had a renal transplant. This was felt to be related to dental infection & ingestion of  colas and  Goody Powders  Diabetes status assessment: Fasting or morning glucose range: 98- 134 Highest glucose 2 hours after any meal:  Not checked. Hypoglycemia :  no .                                                     Excess thirst :no;  Excess hunger:  no ;  Excess urination:  no.                                  Lightheadedness with standing:  no. Chest pain:  no ; Palpitations :no ;  Pain in  calves with walking:  no .                                                                                                                                 Non healing skin  ulcers or sores,especially over the feet:  no. Numbness or tingling or burning in feet : numbness & burning L foot since back surgery .                                                                                                                                              Significant change in  Weight : no. Vision changes :  blurred in heat                                                                    Exercise : no . Nutrition/diet:  no. Medication compliance : yes.  Eye exam : 05/2010; no retinopathy. Foot care : no.  A1c/ urine  microalbumin monitor:  A1c 6.9 % 08/26/2010         Review of Systems     Objective:   Physical Exam Gen.: Healthy and well-nourished in appearance. Alert, appropriate and cooperative throughout exam. Lungs: Normal respiratory effort; chest expands symmetrically. Lungs are clear to auscultation without rales, wheezes, or increased work of breathing. Heart: Normal rate and rhythm. Normal S1 and S2. No gallop, click, or rub. S4 with slurring; no murmur. Abdomen: Bowel sounds normal; abdomen soft and nontender. No masses, organomegaly .Ventral hernia noted. .                                                                                   Musculoskeletal/extremities: No deformity or scoliosis noted of  the thoracic or lumbar spine. No clubbing, cyanosis, edema. DIP deformity of 5th digits  noted. Nail health  good. Vascular: Carotid, radial artery, dorsalis pedis and  posterior tibial pulses are full and equal. No bruits present. Neurologic: Alert and oriented x3. Light touch normal over feet        Skin: Intact without suspicious lesions or rashes. Lymph: No cervical, axillary lymphadenopathy present. Psych: Mood and affect are normal. Normally interactive                                                                                         Assessment & Plan:  #1 renal calculus  #2 possible renal cyst  #3 mild renal insufficiency, progressive  #4 diabetes; control adequate  Plan: Hold all of the possible nephrotoxic agents and monitor renal function. He should force oral fluids. An  A1c will be rechecked 6 weeks off metformin.

## 2010-09-26 NOTE — Patient Instructions (Addendum)
BUN, creatinine, and GFR  all assess kidney function. To protect the kidneys it  is important to control your blood pressure and sugar. You should also stay well hydrated. Drink to thirst, up to 40 ounces of water a day.  Stop the allopurinol, spironolactone, and metformin. Recheck her A1c, BUN, creatinine and potassium in 6 weeks. Please take this record and copies of the labs and x-rays to the urology appointment. The urologist will determine whether additional imaging is necessary to assess the renal cysts. .Follow the low carb nutrition program in [employer] [place] [employer] [place] as closely as possible to prevent Diabetes progression & complications.

## 2010-10-29 ENCOUNTER — Emergency Department (HOSPITAL_COMMUNITY)
Admission: EM | Admit: 2010-10-29 | Discharge: 2010-10-29 | Disposition: A | Discharge status: Home / Self Care | Payer: Medicare Other | Attending: Emergency Medicine | Admitting: Emergency Medicine

## 2010-10-29 ENCOUNTER — Emergency Department (HOSPITAL_COMMUNITY): Payer: Medicare Other

## 2010-10-29 DIAGNOSIS — I10 Essential (primary) hypertension: Secondary | ICD-10-CM | POA: Insufficient documentation

## 2010-10-29 DIAGNOSIS — E119 Type 2 diabetes mellitus without complications: Secondary | ICD-10-CM | POA: Insufficient documentation

## 2010-10-29 DIAGNOSIS — M109 Gout, unspecified: Secondary | ICD-10-CM | POA: Insufficient documentation

## 2010-10-29 DIAGNOSIS — R109 Unspecified abdominal pain: Secondary | ICD-10-CM | POA: Insufficient documentation

## 2010-10-29 DIAGNOSIS — E7800 Pure hypercholesterolemia, unspecified: Secondary | ICD-10-CM | POA: Insufficient documentation

## 2010-10-29 DIAGNOSIS — Z87442 Personal history of urinary calculi: Secondary | ICD-10-CM | POA: Insufficient documentation

## 2010-10-29 LAB — COMPREHENSIVE METABOLIC PANEL
ALT: 26 U/L (ref 0–53)
AST: 23 U/L (ref 0–37)
Albumin: 3.8 g/dL (ref 3.5–5.2)
Alkaline Phosphatase: 66 U/L (ref 39–117)
BUN: 13 mg/dL (ref 6–23)
CO2: 25 mEq/L (ref 19–32)
Calcium: 9.4 mg/dL (ref 8.4–10.5)
Chloride: 106 mEq/L (ref 96–112)
Creatinine, Ser: 1.04 mg/dL (ref 0.50–1.35)
GFR calc Af Amer: >60 mL/min (ref >60)
GFR calc non Af Amer: >60 mL/min (ref >60)
Glucose, Bld: 125 mg/dL — ABNORMAL HIGH (ref 70–99)
Potassium: 3.7 mEq/L (ref 3.5–5.1)
Sodium: 139 mEq/L (ref 135–145)
Total Bilirubin: 0.3 mg/dL (ref 0.3–1.2)
Total Protein: 7.4 g/dL (ref 6.0–8.3)

## 2010-10-29 LAB — DIFFERENTIAL
Basophils Absolute: 0 10*3/uL (ref 0.0–0.1)
Basophils Relative: 0 % (ref 0–1)
Eosinophils Absolute: 0.2 10*3/uL (ref 0.0–0.7)
Eosinophils Relative: 2 % (ref 0–5)
Lymphocytes Relative: 13 % (ref 12–46)
Lymphs Abs: 1.4 10*3/uL (ref 0.7–4.0)
Monocytes Absolute: 1.3 10*3/uL — ABNORMAL HIGH (ref 0.1–1.0)
Monocytes Relative: 12 % (ref 3–12)
Neutro Abs: 7.9 10*3/uL — ABNORMAL HIGH (ref 1.7–7.7)
Neutrophils Relative %: 73 % (ref 43–77)

## 2010-10-29 LAB — CBC
HCT: 34.6 % — ABNORMAL LOW (ref 39.0–52.0)
Hemoglobin: 11.5 g/dL — ABNORMAL LOW (ref 13.0–17.0)
MCH: 28.8 pg (ref 26.0–34.0)
MCHC: 33.2 g/dL (ref 30.0–36.0)
MCV: 86.7 fL (ref 78.0–100.0)
Platelets: 176 10*3/uL (ref 150–400)
RBC: 3.99 MIL/uL — ABNORMAL LOW (ref 4.22–5.81)
RDW: 13.6 % (ref 11.5–15.5)
WBC: 10.8 10*3/uL — ABNORMAL HIGH (ref 4.0–10.5)

## 2010-10-29 LAB — URINALYSIS, ROUTINE W REFLEX MICROSCOPIC
Bilirubin Urine: NEGATIVE
Glucose, UA: NEGATIVE mg/dL
Hgb urine dipstick: NEGATIVE
Ketones, ur: NEGATIVE mg/dL
Leukocytes, UA: NEGATIVE
Nitrite: NEGATIVE
Protein, ur: NEGATIVE mg/dL
Specific Gravity, Urine: 1.023 (ref 1.005–1.030)
Urobilinogen, UA: 1 mg/dL (ref 0.0–1.0)
pH: 6.5 (ref 5.0–8.0)

## 2010-11-04 ENCOUNTER — Encounter: Payer: Self-pay | Admitting: Internal Medicine

## 2010-11-04 ENCOUNTER — Ambulatory Visit (INDEPENDENT_AMBULATORY_CARE_PROVIDER_SITE_OTHER): Payer: Medicare Other | Admitting: Internal Medicine

## 2010-11-04 DIAGNOSIS — D649 Anemia, unspecified: Secondary | ICD-10-CM

## 2010-11-04 DIAGNOSIS — N289 Disorder of kidney and ureter, unspecified: Secondary | ICD-10-CM

## 2010-11-04 DIAGNOSIS — E119 Type 2 diabetes mellitus without complications: Secondary | ICD-10-CM

## 2010-11-04 DIAGNOSIS — M109 Gout, unspecified: Secondary | ICD-10-CM

## 2010-11-04 DIAGNOSIS — D518 Other vitamin B12 deficiency anemias: Secondary | ICD-10-CM

## 2010-11-04 LAB — CBC WITH DIFFERENTIAL/PLATELET
Basophils Absolute: 0 10*3/uL (ref 0.0–0.1)
Basophils Relative: 0.5 % (ref 0.0–3.0)
Eosinophils Absolute: 0.3 10*3/uL (ref 0.0–0.7)
Eosinophils Relative: 5.1 % — ABNORMAL HIGH (ref 0.0–5.0)
HCT: 35.8 % — ABNORMAL LOW (ref 39.0–52.0)
Hemoglobin: 11.9 g/dL — ABNORMAL LOW (ref 13.0–17.0)
Lymphocytes Relative: 20.5 % (ref 12.0–46.0)
Lymphs Abs: 1.1 10*3/uL (ref 0.7–4.0)
MCHC: 33.2 g/dL (ref 30.0–36.0)
MCV: 87.7 fl (ref 78.0–100.0)
Monocytes Absolute: 0.4 10*3/uL (ref 0.1–1.0)
Monocytes Relative: 7.2 % (ref 3.0–12.0)
Neutro Abs: 3.6 10*3/uL (ref 1.4–7.7)
Neutrophils Relative %: 66.7 % (ref 43.0–77.0)
Platelets: 241 10*3/uL (ref 150.0–400.0)
RBC: 4.08 Mil/uL — ABNORMAL LOW (ref 4.22–5.81)
RDW: 13.8 % (ref 11.5–14.6)
WBC: 5.4 10*3/uL (ref 4.5–10.5)

## 2010-11-04 LAB — CREATININE, SERUM: Creatinine, Ser: 1.3 mg/dL (ref 0.4–1.5)

## 2010-11-04 LAB — IBC PANEL
Iron: 47 ug/dL (ref 42–165)
Saturation Ratios: 15.8 % — ABNORMAL LOW (ref 20.0–50.0)
Transferrin: 211.9 mg/dL — ABNORMAL LOW (ref 212.0–360.0)

## 2010-11-04 LAB — URIC ACID: Uric Acid, Serum: 6.8 mg/dL (ref 4.0–7.8)

## 2010-11-04 LAB — BUN: BUN: 20 mg/dL (ref 6–23)

## 2010-11-04 LAB — POTASSIUM: Potassium: 4.4 mEq/L (ref 3.5–5.1)

## 2010-11-04 LAB — HEMOGLOBIN A1C: Hgb A1c MFr Bld: 6.4 % (ref 4.6–6.5)

## 2010-11-04 NOTE — Progress Notes (Signed)
  Subjective:    Patient ID: Donald Carlson, male    DOB: [birth year], 65 y.o.   MRN: [id number]  HPI  He was seen in the emergency room August 11 for right-sided chest pain. This resolved with medications and has not recurred. Labs from that date were reviewed. His hematocrit was 34.6 down from a value of 38.8 July 5. He denies any bleeding dyscrasias. Specifically he denies epistaxis, hemoptysis, abdominal pain, melena, rectal bleeding, hematuria, abnormal bruising or bleeding. He has no history of ulcer.  He was treated for renal calculi July 5 in emergency room. He thought the right-sided chest pain was recurrent  calculi. At that time his creatinine had risen from 1.4  June 08,2012 to 1.7. Spironolactone, metformin, and allopurinol were discontinued because of the renal insufficiency.   He is here today because of gout symptoms: Location: L great toe Onset:8/10 Trigger/injury:no dietary trigger Pain quality:sharp Pain severity:up to "10+" Duration:days Radiation:no Exacerbating factors:no Treatment/response:Allopurinol 8/13 with benefit  Review of Systems Review of systems: Constitutional: no fever, chills, sweats, change in weight  Musculoskeletal: muscle cramps L calf due to limping on L foot;  joint stiffness, redness, & swelling Skin:some "purple" color change Neuro: unrelated  numbness and tingling LLE from N-S      Objective:   Physical Exam Gen.: Healthy and well-nourished in appearance. Alert, appropriate and cooperative throughout exam.  Lungs: Normal respiratory effort; chest expands symmetrically. Lungs are clear to auscultation without rales, wheezes, or increased work of breathing. Heart: Normal rate and rhythm. Normal S1 and S2. No gallop, click, or rub. Grade 1 basilar systolic  murmur. Abdomen: Bowel sounds normal; abdomen soft and nontender. No masses, organomegaly .Ventral hernia noted.   Musculoskeletal/extremities: No deformity or scoliosis noted of  the thoracic or lumbar spine. No clubbing, cyanosis, edema, or deformity noted. L great toe joint slightly tender & swollen. Nail health  good. Vascular: Carotid, radial artery, dorsalis pedis and  posterior tibial pulses are full and equal. No bruits present. Neurologic: Alert and oriented x3. Deep tendon reflexes symmetrical and normal.          Skin: Intact without suspicious lesions or rashes. Lymph: No cervical, axillary  lymphadenopathy present. Psych: Mood and affect are normal. Normally interactive                                                                                         Assessment & Plan:   #1 acute podagra left foot, resolving slowly with allopurinol  #2 renal insufficiency mild ( creatinine 1.7); allopurinol relatively contraindicated  #3 anemia mild  Plan: See labs and orders. He may be able to get colchicine through [place];it  is not available in the [place].

## 2010-11-04 NOTE — Patient Instructions (Addendum)
The most common cause of elevated triglycerides is the ingestion of sugar from high fructose corn syrup sources. You should consume less than 40 grams  of sugar per day from foods and drinks with high fructose corn syrup as number 2, 3, or #4 on the label. As TG go up, HDL or good cholesterol goes down. Also uric acid which causes gout will go up.   The minimal goal to prevent gout is a  uric acid  value  less than 7 .Preferred is less than 6 and ideal is less than 5 .  See if Colchicine is available from [place] ( see form)

## 2010-11-11 ENCOUNTER — Other Ambulatory Visit: Payer: Medicare Other

## 2010-11-17 ENCOUNTER — Other Ambulatory Visit: Payer: Self-pay | Admitting: Internal Medicine

## 2010-11-17 DIAGNOSIS — D649 Anemia, unspecified: Secondary | ICD-10-CM

## 2010-11-17 DIAGNOSIS — D518 Other vitamin B12 deficiency anemias: Secondary | ICD-10-CM

## 2010-11-18 ENCOUNTER — Other Ambulatory Visit (INDEPENDENT_AMBULATORY_CARE_PROVIDER_SITE_OTHER): Payer: Medicare Other

## 2010-11-18 DIAGNOSIS — D518 Other vitamin B12 deficiency anemias: Secondary | ICD-10-CM

## 2010-11-18 LAB — VITAMIN B12: Vitamin B-12: 133 pg/mL — ABNORMAL LOW (ref 211–911)

## 2010-11-18 NOTE — Progress Notes (Signed)
Labs only

## 2010-11-22 ENCOUNTER — Encounter: Payer: Self-pay | Admitting: *Deleted

## 2010-11-23 NOTE — Progress Notes (Signed)
Labs only

## 2010-11-25 ENCOUNTER — Other Ambulatory Visit: Payer: Self-pay | Admitting: Internal Medicine

## 2010-11-25 ENCOUNTER — Telehealth: Payer: Self-pay

## 2010-11-25 MED ORDER — CYANOCOBALAMIN 500 MCG/0.1ML NA SOLN: NASAL | Status: DC

## 2010-11-25 NOTE — Telephone Encounter (Signed)
Patient called triage line and stated that she checked with her insurance company and they will cover the nasal b12, would like rx sent to [facility] on [address]. RX sent

## 2010-12-12 ENCOUNTER — Other Ambulatory Visit: Payer: Self-pay | Admitting: Internal Medicine

## 2010-12-26 ENCOUNTER — Emergency Department (HOSPITAL_COMMUNITY): Payer: Medicare Other

## 2010-12-26 ENCOUNTER — Emergency Department (HOSPITAL_COMMUNITY)
Admission: EM | Admit: 2010-12-26 | Discharge: 2010-12-26 | Disposition: A | Discharge status: Home / Self Care | Payer: Medicare Other | Attending: Emergency Medicine | Admitting: Emergency Medicine

## 2010-12-26 DIAGNOSIS — E7800 Pure hypercholesterolemia, unspecified: Secondary | ICD-10-CM | POA: Insufficient documentation

## 2010-12-26 DIAGNOSIS — R110 Nausea: Secondary | ICD-10-CM | POA: Insufficient documentation

## 2010-12-26 DIAGNOSIS — E119 Type 2 diabetes mellitus without complications: Secondary | ICD-10-CM | POA: Insufficient documentation

## 2010-12-26 DIAGNOSIS — I10 Essential (primary) hypertension: Secondary | ICD-10-CM | POA: Insufficient documentation

## 2010-12-26 DIAGNOSIS — Z0000 Encounter for general adult medical examination without abnormal findings: Secondary | ICD-10-CM | POA: Insufficient documentation

## 2010-12-26 DIAGNOSIS — M109 Gout, unspecified: Secondary | ICD-10-CM | POA: Insufficient documentation

## 2010-12-26 DIAGNOSIS — Z87442 Personal history of urinary calculi: Secondary | ICD-10-CM | POA: Insufficient documentation

## 2010-12-26 DIAGNOSIS — R109 Unspecified abdominal pain: Secondary | ICD-10-CM | POA: Insufficient documentation

## 2010-12-26 LAB — URINALYSIS, ROUTINE W REFLEX MICROSCOPIC
Bilirubin Urine: NEGATIVE
Glucose, UA: NEGATIVE mg/dL
Ketones, ur: NEGATIVE mg/dL
Leukocytes, UA: NEGATIVE
Nitrite: NEGATIVE
Protein, ur: NEGATIVE mg/dL
Specific Gravity, Urine: 1.022 (ref 1.005–1.030)
Urobilinogen, UA: 1 mg/dL (ref 0.0–1.0)
pH: 7 (ref 5.0–8.0)

## 2010-12-26 LAB — BASIC METABOLIC PANEL
BUN: 12 mg/dL (ref 6–23)
CO2: 28 mEq/L (ref 19–32)
Calcium: 9.7 mg/dL (ref 8.4–10.5)
Chloride: 102 mEq/L (ref 96–112)
Creatinine, Ser: 0.95 mg/dL (ref 0.50–1.35)
GFR calc Af Amer: >90 mL/min (ref >90)
GFR calc non Af Amer: 85 mL/min — ABNORMAL LOW (ref >90)
Glucose, Bld: 93 mg/dL (ref 70–99)
Potassium: 3.9 mEq/L (ref 3.5–5.1)
Sodium: 139 mEq/L (ref 135–145)

## 2010-12-26 LAB — DIFFERENTIAL
Basophils Absolute: 0 10*3/uL (ref 0.0–0.1)
Basophils Relative: 1 % (ref 0–1)
Eosinophils Absolute: 0.4 10*3/uL (ref 0.0–0.7)
Eosinophils Relative: 5 % (ref 0–5)
Lymphocytes Relative: 24 % (ref 12–46)
Lymphs Abs: 1.7 10*3/uL (ref 0.7–4.0)
Monocytes Absolute: 0.6 10*3/uL (ref 0.1–1.0)
Monocytes Relative: 9 % (ref 3–12)
Neutro Abs: 4.1 10*3/uL (ref 1.7–7.7)
Neutrophils Relative %: 61 % (ref 43–77)

## 2010-12-26 LAB — CBC
HCT: 41.2 % (ref 39.0–52.0)
Hemoglobin: 13.5 g/dL (ref 13.0–17.0)
MCH: 28.1 pg (ref 26.0–34.0)
MCHC: 32.8 g/dL (ref 30.0–36.0)
MCV: 85.7 fL (ref 78.0–100.0)
Platelets: 205 10*3/uL (ref 150–400)
RBC: 4.81 MIL/uL (ref 4.22–5.81)
RDW: 13.2 % (ref 11.5–15.5)
WBC: 6.8 10*3/uL (ref 4.0–10.5)

## 2010-12-26 LAB — URINE MICROSCOPIC-ADD ON

## 2011-01-02 ENCOUNTER — Other Ambulatory Visit: Payer: Self-pay | Admitting: Urology

## 2011-01-02 ENCOUNTER — Other Ambulatory Visit: Payer: Self-pay | Admitting: Internal Medicine

## 2011-01-02 ENCOUNTER — Ambulatory Visit (HOSPITAL_BASED_OUTPATIENT_CLINIC_OR_DEPARTMENT_OTHER)
Admission: RE | Admit: 2011-01-02 | Discharge: 2011-01-02 | Disposition: A | Discharge status: Home / Self Care | Payer: Medicare Other | Source: Ambulatory Visit | Attending: Urology | Admitting: Urology

## 2011-01-02 DIAGNOSIS — Z79899 Other long term (current) drug therapy: Secondary | ICD-10-CM | POA: Insufficient documentation

## 2011-01-02 DIAGNOSIS — C672 Malignant neoplasm of lateral wall of bladder: Secondary | ICD-10-CM | POA: Insufficient documentation

## 2011-01-02 DIAGNOSIS — E119 Type 2 diabetes mellitus without complications: Secondary | ICD-10-CM | POA: Insufficient documentation

## 2011-01-02 DIAGNOSIS — I10 Essential (primary) hypertension: Secondary | ICD-10-CM | POA: Insufficient documentation

## 2011-01-02 DIAGNOSIS — E7800 Pure hypercholesterolemia, unspecified: Secondary | ICD-10-CM | POA: Insufficient documentation

## 2011-01-02 DIAGNOSIS — Z7982 Long term (current) use of aspirin: Secondary | ICD-10-CM | POA: Insufficient documentation

## 2011-01-02 DIAGNOSIS — N35919 Unspecified urethral stricture, male, unspecified site: Secondary | ICD-10-CM | POA: Insufficient documentation

## 2011-01-02 LAB — POCT I-STAT, CHEM 8
BUN: 14 mg/dL (ref 6–23)
Calcium, Ion: 1.24 mmol/L (ref 1.12–1.32)
Chloride: 107 mEq/L (ref 96–112)
Creatinine, Ser: 0.9 mg/dL (ref 0.50–1.35)
Glucose, Bld: 110 mg/dL — ABNORMAL HIGH (ref 70–99)
HCT: 40 % (ref 39.0–52.0)
Hemoglobin: 13.6 g/dL (ref 13.0–17.0)
Potassium: 4 mEq/L (ref 3.5–5.1)
Sodium: 142 mEq/L (ref 135–145)
TCO2: 24 mmol/L (ref 0–100)

## 2011-01-02 LAB — GLUCOSE, CAPILLARY: Glucose-Capillary: 98 mg/dL (ref 70–99)

## 2011-01-02 NOTE — Telephone Encounter (Signed)
Left message on voicemail for patient to return call when available, reason for call , med not on list (checked centricity also)

## 2011-01-02 NOTE — Telephone Encounter (Signed)
Spoke with patient's wife, patient was told to restart med, med was stopped due to kidney stones but at last OV Dr.Hopper told patient to restart.  Dr.Hopper please advise, I reviewed  OV note and did not see instruction to restart Allopurinol

## 2011-01-02 NOTE — Telephone Encounter (Signed)
Discuss with patient who states that he will wait until up coming appt to address this issue.

## 2011-01-02 NOTE — Telephone Encounter (Signed)
This medication can aggravate kidney impairment. The creatinine was 1.7 which was significantly elevated. Renal function and uric acids should be rechecked prior to restarting allopurinol ( BUN, creat, K+, uric acid ). He was to check on getting  colchicine from [place].

## 2011-01-03 ENCOUNTER — Ambulatory Visit (INDEPENDENT_AMBULATORY_CARE_PROVIDER_SITE_OTHER)
Admission: RE | Admit: 2011-01-03 | Discharge: 2011-01-03 | Disposition: A | Discharge status: Home / Self Care | Payer: Medicare Other | Source: Ambulatory Visit | Attending: Internal Medicine | Admitting: Internal Medicine

## 2011-01-03 ENCOUNTER — Ambulatory Visit (INDEPENDENT_AMBULATORY_CARE_PROVIDER_SITE_OTHER): Payer: Medicare Other | Admitting: Internal Medicine

## 2011-01-03 ENCOUNTER — Encounter: Payer: Self-pay | Admitting: Internal Medicine

## 2011-01-03 VITALS — BP 124/80 | Temp 98.7°F | Wt 213.0 lb

## 2011-01-03 DIAGNOSIS — R0789 Other chest pain: Secondary | ICD-10-CM

## 2011-01-03 DIAGNOSIS — R071 Chest pain on breathing: Secondary | ICD-10-CM

## 2011-01-03 DIAGNOSIS — N289 Disorder of kidney and ureter, unspecified: Secondary | ICD-10-CM

## 2011-01-03 DIAGNOSIS — M109 Gout, unspecified: Secondary | ICD-10-CM

## 2011-01-03 NOTE — Patient Instructions (Addendum)
Order for x-rays entered into  the computer; these will be performed at [address] [facility]. across from [facility]. No appointment is necessary.  Use the narcotic pain medication for the chest wall pain as well.

## 2011-01-03 NOTE — Progress Notes (Signed)
  Subjective:    Patient ID: Donald Carlson, male    DOB: [birth year], 65 y.o.   MRN: [id number]  HPI CHEST PAIN: Onset (rest, exertion): 10/8; seen in ER. CT scan done; negative for kidney stones Location: R lateral chest  Quality: burning  Duration: constant but improved since 10/8 with pain pills  Radiation: inferiorly slightly  Worse with: no triggers Symptoms History of Trauma/lifting: no  Nausea/vomiting: no  Diaphoresis: no  Shortness of breath: no  Pleuritic: no  Cough: no  Edema: no  Orthopnea: no  PND: no  Dizziness: no  Palpitations: no  Syncope: no  Indigestion: no   Red Flags Worse with exertion: yes, with lifting  Recent Immobility: yes, OP  bladder surgery  10/15 for cancer by Dr Retta Diones Tearing/radiation to back: no       Review of Systems  Most recent labs reviewed; creatinine is now 0.9 down from 1.7.     Objective:   Physical Exam General appearance is one of good health and nourishment w/o distress but uncomfortable.  Eyes: No conjunctival inflammation or scleral icterus is present.  Oral exam: Dental hygiene is good; lips and gums are healthy appearing.There is no oropharyngeal erythema or exudate noted.   Heart:  Normal rate and regular rhythm. S1 and S2 normal without gallop, murmur, click, rub S 4  Lungs:Chest clear to auscultation; no wheezes, rhonchi,rales ,or rubs present.No increased work of breathing. He does have discomfort to direct pressure over the right lateral rib cage.  Musculoskeletal: He is able to lie back and set up without help. There is no pain to percussion over the thoracic spine. Strength is normal in all extremities  Neuro: Deep tendon reflexes are normal   Abdomen: bowel sounds normal, soft and non-tender without masses, organomegaly or hernias noted.  No guarding or rebound   Skin:Warm & dry.  Intact without suspicious lesions or rashes ; no jaundice . Specifically there is no zoster rash on the  right.  Vascular: All pulses are intact without bruits. Homans sign is negative bilaterally.  Lymphatic: No lymphadenopathy is noted about the head, neck, axilla  areas.              Assessment & Plan:  #1 chest pain; musculoskeletal pain is suggested. Herpes zoster (shingles) should have been associated with a rash at some point in the last 8 days. Findings on palpation suggesting chest wall etiology. Thoracic radicular pain is not suggestive clinically  #2 bladder tumor, status post resection  #3 mild renal insufficiency; resolved  #4 gout; quiescent.  Plan see orders and recommendations.

## 2011-01-09 ENCOUNTER — Other Ambulatory Visit: Payer: Self-pay | Admitting: Internal Medicine

## 2011-01-09 NOTE — Telephone Encounter (Signed)
Spoke with patient's wife, patient is not taking med and did not request, Per Dr.Hopper patient to no longer take med   I called the pharmacy (Spoke with Zella Ball) and informed them to remove med from patient med list.

## 2011-01-10 NOTE — Op Note (Signed)
NAMEJUDD, MCCUBBIN NO.:  [id number]  MEDICAL RECORD NO.:  [id number]  LOCATION:                                 FACILITY:  PHYSICIAN:  Bertram Millard. Laila Myhre, M.D.DATE OF BIRTH:  Aug 30, 1945  DATE OF PROCEDURE:  01/02/2011 DATE OF DISCHARGE:                              OPERATIVE REPORT   PREOPERATIVE DIAGNOSIS:  Urethral stricture, 3-cm right bladder tumor.  PRINCIPAL PROCEDURES:  Cystoscopy, balloon dilatation of urethra, TURBT of 3-cm papillary right bladder wall tumor, placement of intravesical mitomycin C.  SURGEON:  Bertram Millard. Osha Errico, M.D.  ANESTHESIA:  General with LMA.  COMPLICATIONS:  None.  SPECIMEN:  Bladder tumor, bladder base to Pathology.  BRIEF HISTORY:  This 65 year old male recently came to see me for abnormalities on his CT scan.  He went to the emergency room for right lateral chest pain.  It was felt that the patient may be passing a kidney stone, as he did have a right-sided stone earlier this year.  CT revealed an abnormality of the patient's posterior bladder and the patient was told to emergently seek a urologist.  Cystoscopy revealed a fair urethral stricture, panurethral, but also a 2-3 cm bladder tumor. It was recommended that he undergo TURBT as well as urethral dilation. He is aware of the risks of the procedure as well as the need for placing mitomycin intravesically after the procedure to decrease the risk of recurrence.  He accepts these and desires to proceed.  DESCRIPTION OF PROCEDURE:  The patient was identified in the holding area and received preoperative IV antibiotics.  He was taken to the operating room where general anesthetic was administered with the LMA. He was placed in the dorsal lithotomy position.  Genitalia and perineum were prepped and draped.  Time-out was then performed.  The procedure then commenced.  A 22-French panendoscope was advanced into his urethra.  It could not be passed more  proximally, as there was fairly significant lengthy stricture, which previously admitted a 73- French flexible scope, but could not pass the cystoscope.  I then placed a guidewire into the bladder through the scope and removed the scope. The urethra was dilated to 15-mm with a nephrostomy tract balloon under pressure to 16 atmospheres.  The cystoscope was then replaced.  I was easily able to view the whole bladder, which revealed mild trabeculations, no other lesions except for a 2.5 to 3 cm papillary tumor in the right lateral wall just over the intramural ureter on that side.  I used a cold cup biopsy forceps to remove the tumor.  Two to three bites were taken of the tumor, which then removed it totally. This was sent as one specimen - bladder tumor.  I then used a cold cup biopsy forceps to pick at the base of this, to remove any remaining papillary tumor.  This was sent as bladder tumor base.  I then used the Bugbee electrode to cauterize the base of this.  Cystoscopically, there was no bleeding, and there was no evident perforation.  At this point, the bladder was drained.  I placed a 20- [place] Foley catheter.  The bladder was drained and a plug was put in  the catheter.  The patient was then awakened and taken to the PACU in stable condition.  In the PACU, I placed 40 mg of mitomycin and 40 cc of diluent.  It was left indwelling for 1 hour.     Bertram Millard. Elza Sortor, M.D.     SMD/MEDQ  D:  01/06/2011  T:  01/06/2011  Job:  [id number]  cc:   Titus Dubin. Alwyn Ren, MD,FACP,FCCP [phone] [place] [address]  Electronically Signed by Marcine Matar M.D. on 01/10/2011 06:00:33 PM

## 2011-07-09 ENCOUNTER — Other Ambulatory Visit: Payer: Self-pay | Admitting: Internal Medicine

## 2011-07-27 ENCOUNTER — Other Ambulatory Visit: Payer: Self-pay | Admitting: Dermatology

## 2011-08-08 ENCOUNTER — Other Ambulatory Visit: Payer: Self-pay | Admitting: Internal Medicine

## 2011-08-08 MED ORDER — PRAVASTATIN SODIUM 40 MG PO TABS: 40.0000 mg | ORAL_TABLET | Freq: Every day | ORAL | Status: DC

## 2011-08-08 NOTE — Telephone Encounter (Signed)
RX sent

## 2011-08-20 ENCOUNTER — Inpatient Hospital Stay (HOSPITAL_COMMUNITY)
Admission: EM | Admit: 2011-08-20 | Discharge: 2011-09-01 | DRG: 337 | Disposition: A | Discharge status: Home / Self Care | Payer: Medicare Other | Attending: General Surgery | Admitting: General Surgery

## 2011-08-20 ENCOUNTER — Emergency Department (HOSPITAL_COMMUNITY): Payer: Medicare Other

## 2011-08-20 ENCOUNTER — Encounter (HOSPITAL_COMMUNITY): Payer: Self-pay | Admitting: *Deleted

## 2011-08-20 DIAGNOSIS — K56609 Unspecified intestinal obstruction, unspecified as to partial versus complete obstruction: Secondary | ICD-10-CM

## 2011-08-20 DIAGNOSIS — Z8679 Personal history of other diseases of the circulatory system: Secondary | ICD-10-CM | POA: Diagnosis present

## 2011-08-20 DIAGNOSIS — E119 Type 2 diabetes mellitus without complications: Secondary | ICD-10-CM | POA: Diagnosis present

## 2011-08-20 DIAGNOSIS — K565 Intestinal adhesions [bands], unspecified as to partial versus complete obstruction: Principal | ICD-10-CM | POA: Diagnosis present

## 2011-08-20 DIAGNOSIS — E1165 Type 2 diabetes mellitus with hyperglycemia: Secondary | ICD-10-CM | POA: Diagnosis present

## 2011-08-20 DIAGNOSIS — I152 Hypertension secondary to endocrine disorders: Secondary | ICD-10-CM | POA: Diagnosis present

## 2011-08-20 DIAGNOSIS — E1140 Type 2 diabetes mellitus with diabetic neuropathy, unspecified: Secondary | ICD-10-CM | POA: Diagnosis present

## 2011-08-20 DIAGNOSIS — I10 Essential (primary) hypertension: Secondary | ICD-10-CM | POA: Diagnosis present

## 2011-08-20 HISTORY — DX: Other specified postprocedural states: Z98.89

## 2011-08-20 HISTORY — DX: Calculus of kidney: N20.0

## 2011-08-20 HISTORY — DX: Anemia, unspecified: D64.9

## 2011-08-20 HISTORY — DX: Unspecified osteoarthritis, unspecified site: M19.90

## 2011-08-20 HISTORY — DX: Malignant neoplasm of bladder, unspecified: C67.9

## 2011-08-20 HISTORY — DX: Essential (primary) hypertension: I10

## 2011-08-20 HISTORY — DX: Unspecified intestinal obstruction, unspecified as to partial versus complete obstruction: K56.609

## 2011-08-20 LAB — CBC
HCT: 41.8 % (ref 39.0–52.0)
Hemoglobin: 14 g/dL (ref 13.0–17.0)
MCH: 28.7 pg (ref 26.0–34.0)
MCHC: 33.5 g/dL (ref 30.0–36.0)
MCV: 85.8 fL (ref 78.0–100.0)
Platelets: 178 10*3/uL (ref 150–400)
RBC: 4.87 MIL/uL (ref 4.22–5.81)
RDW: 13 % (ref 11.5–15.5)
WBC: 7 10*3/uL (ref 4.0–10.5)

## 2011-08-20 LAB — URINALYSIS, ROUTINE W REFLEX MICROSCOPIC
Bilirubin Urine: NEGATIVE
Glucose, UA: NEGATIVE mg/dL
Hgb urine dipstick: NEGATIVE
Ketones, ur: NEGATIVE mg/dL
Leukocytes, UA: NEGATIVE
Nitrite: NEGATIVE
Protein, ur: NEGATIVE mg/dL
Specific Gravity, Urine: 1.03 (ref 1.005–1.030)
Urobilinogen, UA: 1 mg/dL (ref 0.0–1.0)
pH: 5.5 (ref 5.0–8.0)

## 2011-08-20 LAB — COMPREHENSIVE METABOLIC PANEL
ALT: 44 U/L (ref 0–53)
AST: 27 U/L (ref 0–37)
Albumin: 3.6 g/dL (ref 3.5–5.2)
Alkaline Phosphatase: 77 U/L (ref 39–117)
BUN: 20 mg/dL (ref 6–23)
CO2: 26 mEq/L (ref 19–32)
Calcium: 9 mg/dL (ref 8.4–10.5)
Chloride: 103 mEq/L (ref 96–112)
Creatinine, Ser: 1.1 mg/dL (ref 0.50–1.35)
GFR calc Af Amer: 79 mL/min — ABNORMAL LOW (ref >90)
GFR calc non Af Amer: 68 mL/min — ABNORMAL LOW (ref >90)
Glucose, Bld: 120 mg/dL — ABNORMAL HIGH (ref 70–99)
Potassium: 4.3 mEq/L (ref 3.5–5.1)
Sodium: 137 mEq/L (ref 135–145)
Total Bilirubin: 0.4 mg/dL (ref 0.3–1.2)
Total Protein: 7.1 g/dL (ref 6.0–8.3)

## 2011-08-20 LAB — DIFFERENTIAL
Basophils Absolute: 0 10*3/uL (ref 0.0–0.1)
Basophils Relative: 0 % (ref 0–1)
Eosinophils Absolute: 0.2 10*3/uL (ref 0.0–0.7)
Eosinophils Relative: 3 % (ref 0–5)
Lymphocytes Relative: 30 % (ref 12–46)
Lymphs Abs: 2.1 10*3/uL (ref 0.7–4.0)
Monocytes Absolute: 0.6 10*3/uL (ref 0.1–1.0)
Monocytes Relative: 9 % (ref 3–12)
Neutro Abs: 4.1 10*3/uL (ref 1.7–7.7)
Neutrophils Relative %: 58 % (ref 43–77)

## 2011-08-20 LAB — LACTIC ACID, PLASMA: Lactic Acid, Venous: 0.7 mmol/L (ref 0.5–2.2)

## 2011-08-20 LAB — LIPASE, BLOOD: Lipase: 25 U/L (ref 11–59)

## 2011-08-20 MED ORDER — HYDROMORPHONE HCL PF 1 MG/ML IJ SOLN
1.0000 mg | INTRAMUSCULAR | Status: DC | PRN
  Administered 2011-08-21 – 2011-08-23 (×10): 1 mg via INTRAVENOUS
  Filled 2011-08-20 (×10): qty 1

## 2011-08-20 MED ORDER — ONDANSETRON HCL 4 MG/2ML IJ SOLN
4.0000 mg | Freq: Four times a day (QID) | INTRAMUSCULAR | Status: DC | PRN
  Administered 2011-08-22: 4 mg via INTRAVENOUS
  Filled 2011-08-20: qty 2

## 2011-08-20 MED ORDER — IOHEXOL 300 MG/ML  SOLN
100.0000 mL | Freq: Once | INTRAMUSCULAR | Status: AC | PRN
  Administered 2011-08-20: 100 mL via INTRAVENOUS

## 2011-08-20 MED ORDER — PANTOPRAZOLE SODIUM 40 MG IV SOLR
40.0000 mg | Freq: Every day | INTRAVENOUS | Status: DC
  Administered 2011-08-21 – 2011-08-29 (×9): 40 mg via INTRAVENOUS
  Filled 2011-08-20 (×10): qty 40

## 2011-08-20 MED ORDER — ENOXAPARIN SODIUM 40 MG/0.4ML ~~LOC~~ SOLN
40.0000 mg | SUBCUTANEOUS | Status: DC
  Administered 2011-08-21 – 2011-09-01 (×12): 40 mg via SUBCUTANEOUS
  Filled 2011-08-20 (×12): qty 0.4

## 2011-08-20 MED ORDER — MORPHINE SULFATE 4 MG/ML IJ SOLN
4.0000 mg | Freq: Once | INTRAMUSCULAR | Status: AC
  Administered 2011-08-20: 4 mg via INTRAVENOUS
  Filled 2011-08-20: qty 1

## 2011-08-20 MED ORDER — INSULIN ASPART 100 UNIT/ML ~~LOC~~ SOLN
0.0000 [IU] | SUBCUTANEOUS | Status: DC
  Administered 2011-08-29: 2 [IU] via SUBCUTANEOUS
  Administered 2011-08-29: 1 [IU] via SUBCUTANEOUS
  Administered 2011-08-30: 3 [IU] via SUBCUTANEOUS
  Administered 2011-08-30 – 2011-08-31 (×3): 2 [IU] via SUBCUTANEOUS

## 2011-08-20 MED ORDER — ONDANSETRON HCL 4 MG/2ML IJ SOLN
4.0000 mg | Freq: Once | INTRAMUSCULAR | Status: AC
  Administered 2011-08-20: 4 mg via INTRAVENOUS
  Filled 2011-08-20: qty 2

## 2011-08-20 MED ORDER — POTASSIUM CHLORIDE IN NACL 20-0.9 MEQ/L-% IV SOLN
INTRAVENOUS | Status: DC
  Administered 2011-08-21 – 2011-08-22 (×6): via INTRAVENOUS
  Administered 2011-08-23: 300 mL via INTRAVENOUS
  Administered 2011-08-23 (×2): via INTRAVENOUS
  Filled 2011-08-20 (×12): qty 1000

## 2011-08-20 MED ORDER — BETAXOLOL HCL 0.25 % OP SUSP
1.0000 [drp] | Freq: Two times a day (BID) | OPHTHALMIC | Status: DC
  Administered 2011-08-21 – 2011-09-01 (×24): 1 [drp] via OPHTHALMIC
  Filled 2011-08-20 (×2): qty 10

## 2011-08-20 NOTE — ED Notes (Signed)
Pt is wanting to leave AMA. ERP Freida Busman notified of patient's request and need for update on further care.

## 2011-08-20 NOTE — ED Notes (Signed)
NG tube to low intermitting wall suction

## 2011-08-20 NOTE — H&P (Signed)
Donald Carlson is an 66 y.o. male.   Chief Complaint: abdominal pain HPI: this is a 66 year old who presents with a 2 day history of cramping abdominal pain. It started in his upper abdomen on Friday. He described it initially as indigestion. It is now continuous and cramping. He describes it as 4 out of 10.  His last bowel movement was this morning. He has not recently passed flatus. The pain is now referred anywhere else. He is otherwise without complaints. He has had an appendectomy for gangrenous appendicitis when he was 15 and then had to have an exploratory laparotomy for bowel obstruction one month after that.  Past Medical History  Diagnosis Date  . Skin cancer (melanoma) 1996    Left/ Back  . Kidney stones     Past Surgical History  Procedure Date  . 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 Hernia repair   . Knee arthroscopy     Left knee  . Colonoscopy 07/2002    neg  . Lumbar laminectomy 2004  . Shoulder surgery 05/2008    Family History  Problem Relation Age of Onset  . Diabetes Mother   . Heart attack Mother 45  . Heart attack Brother 54  . Cancer Brother     Lymph node/neck  . Breast cancer Sister    Social History:  reports that he has never smoked. He does not have any smokeless tobacco history on file. He reports that he does not drink alcohol or use illicit drugs.  Allergies: No Known Allergies   (Not in a hospital admission)  Results for orders placed during the hospital encounter of 08/20/11 (from the past 48 hour(s))  URINALYSIS, ROUTINE W REFLEX MICROSCOPIC     Status: Normal   Collection Time   08/20/11  3:33 PM      Component Value Range Comment   Color, Urine YELLOW  YELLOW     APPearance CLEAR  CLEAR     Specific Gravity, Urine 1.030  1.005 - 1.030     pH 5.5  5.0 - 8.0     Glucose, UA NEGATIVE  NEGATIVE (mg/dL)    Hgb urine dipstick NEGATIVE  NEGATIVE     Bilirubin Urine NEGATIVE  NEGATIVE     Ketones, ur NEGATIVE  NEGATIVE (mg/dL)     Protein, ur NEGATIVE  NEGATIVE (mg/dL)    Urobilinogen, UA 1.0  0.0 - 1.0 (mg/dL)    Nitrite NEGATIVE  NEGATIVE     Leukocytes, UA NEGATIVE  NEGATIVE  MICROSCOPIC NOT DONE ON URINES WITH NEGATIVE PROTEIN, BLOOD, LEUKOCYTES, NITRITE, OR GLUCOSE <1000 mg/dL.  CBC     Status: Normal   Collection Time   08/20/11  4:30 PM      Component Value Range Comment   WBC 7.0  4.0 - 10.5 (K/uL)    RBC 4.87  4.22 - 5.81 (MIL/uL)    Hemoglobin 14.0  13.0 - 17.0 (g/dL)    HCT 81.1  91.4 - 78.2 (%)    MCV 85.8  78.0 - 100.0 (fL)    MCH 28.7  26.0 - 34.0 (pg)    MCHC 33.5  30.0 - 36.0 (g/dL)    RDW 95.6  21.3 - 08.6 (%)    Platelets 178  150 - 400 (K/uL)   DIFFERENTIAL     Status: Normal   Collection Time   08/20/11  4:30 PM      Component Value Range Comment   Neutrophils Relative 58  43 -  77 (%)    Neutro Abs 4.1  1.7 - 7.7 (K/uL)    Lymphocytes Relative 30  12 - 46 (%)    Lymphs Abs 2.1  0.7 - 4.0 (K/uL)    Monocytes Relative 9  3 - 12 (%)    Monocytes Absolute 0.6  0.1 - 1.0 (K/uL)    Eosinophils Relative 3  0 - 5 (%)    Eosinophils Absolute 0.2  0.0 - 0.7 (K/uL)    Basophils Relative 0  0 - 1 (%)    Basophils Absolute 0.0  0.0 - 0.1 (K/uL)   COMPREHENSIVE METABOLIC PANEL     Status: Abnormal   Collection Time   08/20/11  4:30 PM      Component Value Range Comment   Sodium 137  135 - 145 (mEq/L)    Potassium 4.3  3.5 - 5.1 (mEq/L)    Chloride 103  96 - 112 (mEq/L)    CO2 26  19 - 32 (mEq/L)    Glucose, Bld 120 (*) 70 - 99 (mg/dL)    BUN 20  6 - 23 (mg/dL)    Creatinine, Ser 4.54  0.50 - 1.35 (mg/dL)    Calcium 9.0  8.4 - 10.5 (mg/dL)    Total Protein 7.1  6.0 - 8.3 (g/dL)    Albumin 3.6  3.5 - 5.2 (g/dL)    AST 27  0 - 37 (U/L)    ALT 44  0 - 53 (U/L)    Alkaline Phosphatase 77  39 - 117 (U/L)    Total Bilirubin 0.4  0.3 - 1.2 (mg/dL)    GFR calc non Af Amer 68 (*) >90 (mL/min)    GFR calc Af Amer 79 (*) >90 (mL/min)   LIPASE, BLOOD     Status: Normal   Collection Time   08/20/11   4:30 PM      Component Value Range Comment   Lipase 25  11 - 59 (U/L)   LACTIC ACID, PLASMA     Status: Normal   Collection Time   08/20/11  4:30 PM      Component Value Range Comment   Lactic Acid, Venous 0.7  0.5 - 2.2 (mmol/L)    Ct Abdomen Pelvis W Contrast  08/20/2011  *RADIOLOGY REPORT*  Clinical Data: Abdominal pain.  Question small bowel obstruction.  CT ABDOMEN AND PELVIS WITH CONTRAST  Technique:  Multidetector CT imaging of the abdomen and pelvis was performed following the standard protocol during bolus administration of intravenous contrast.  Contrast: OMNIPAQUE IOHEXOL 300 MG/ML  SOLN  Comparison: Plain films of the abdomen earlier this same day and CT abdomen and pelvis 12/26/2010.  Findings: Mild dependent atelectasis is seen in the lung bases.  No pleural or pericardial effusion.  The patient has a small bowel obstruction with proximal loops dilated up to 3.5 cm and air fluid levels.  The transition point is in the right lower quadrant of the abdomen where the mesentery has a swirled appearance.  Transition point is best seen on images 59 to 64.  There is no pneumatosis, portal venous gas or free air. Small amount of interloop fluid in the right lower quadrant is noted.  The colon is completely decompressed but otherwise unremarkable.  The liver is low attenuating consistent with fatty change.  No focal liver lesion.  The gallbladder, adrenal glands, spleen and pancreas appear normal.  Bilateral renal cysts identified.  Kidneys are otherwise unremarkable.  There is some degenerative disease of the  lumbar spine.  IMPRESSION:  1.  Study positive for small bowel obstruction, either early complete or high-grade partial, due to small bowel volvulus in the right lower quadrant. No evidence of bowel ischemia. 2.  Fatty infiltration of the liver. 3.  Renal cyst.  Original Report Authenticated By: Bernadene Bell. D'ALESSIO, M.D.   Dg Abd Acute W/chest  08/20/2011  *RADIOLOGY REPORT*  Clinical Data:  Upper abdominal pain.  Ventral hernia.  ACUTE ABDOMEN SERIES (ABDOMEN 2 VIEW & CHEST 1 VIEW)  Comparison: CT of the abdomen and pelvis 12/26/2010.  Abdominal radiographs 09/30/2010.  Findings: Lung volumes are normal.  No consolidative airspace disease.  No pleural effusions.  No pneumothorax.  No pulmonary nodule or mass noted.  Pulmonary vasculature and the cardiomediastinal silhouette are within normal limits.  No pneumoperitoneum.  Spine and upright views of the abdomen demonstrate some gas and stool scattered throughout the colon.  No distal rectal gas is identified at this time.  There are several dilated loops of gas- filled small bowel noted throughout the central abdomen, largest which measures approximately 4.9 cm in diameter.  Small air fluid levels are appreciated on the upright projection.  IMPRESSION: 1.  Findings, as above, suggestive of small bowel obstruction. Given the small amount of colonic gas and stool, this may represent an early small bowel obstruction. 2.  No pneumoperitoneum at this time. 3.  No radiographic evidence of acute cardiopulmonary disease.  Original Report Authenticated By: Florencia Reasons, M.D.    Review of Systems  Constitutional: Negative.   HENT: Negative.   Eyes: Negative.   Respiratory: Negative.   Cardiovascular: Negative.   Gastrointestinal: Positive for heartburn and abdominal pain. Negative for nausea and vomiting.  Genitourinary: Negative.   Musculoskeletal: Negative.   Skin: Negative.   Neurological: Negative.   Endo/Heme/Allergies: Negative.   Psychiatric/Behavioral: Negative.     Blood pressure 173/87, pulse 56, temperature 98 F (36.7 C), temperature source Oral, resp. rate 16, SpO2 97.00%. Physical Exam  Constitutional: He is oriented to person, place, and time. He appears well-developed and well-nourished. No distress.  HENT:  Head: Normocephalic and atraumatic.  Right Ear: External ear normal.  Left Ear: External ear normal.  Nose:  Nose normal.  Mouth/Throat: Oropharynx is clear and moist. No oropharyngeal exudate.  Eyes: Conjunctivae are normal. Pupils are equal, round, and reactive to light. Right eye exhibits no discharge. Left eye exhibits no discharge. No scleral icterus.  Neck: Normal range of motion. Neck supple. No tracheal deviation present. No thyromegaly present.  Cardiovascular: Normal rate, regular rhythm, normal heart sounds and intact distal pulses.   No murmur heard. Respiratory: Effort normal and breath sounds normal. No respiratory distress. He has no wheezes.  GI: He exhibits distension. He exhibits no mass. There is tenderness. There is no rebound and no guarding.       His abdomen is distended. He has a well-healed lower midline incision and right lower quadrant incision. There is mild tenderness which is greatest in the upper abdomen  Musculoskeletal: Normal range of motion. He exhibits no edema and no tenderness.  Lymphadenopathy:    He has no cervical adenopathy.  Neurological: He is alert and oriented to person, place, and time.  Skin: Skin is warm and dry. No rash noted. He is not diaphoretic. No erythema.  Psychiatric: His behavior is normal. Judgment normal.     Assessment/Plan Small bowel obstruction  I suspect this is from adhesions given his CAT scan. Currently, his white blood count is  normal and he does not have an acute abdomen. We will attempt conservative management with bowel rest, nasogastric suctioning, and IV rehydration. I will repeat his abdominal x-rays in the morning. If he does not improve, or his conditions worsens, we may need to proceed to the operating room.  Rakeem Colley A 08/20/2011, 10:26 PM

## 2011-08-20 NOTE — ED Notes (Signed)
Pt. Alert and oriented, NAD noted, awaiting EDP recheck

## 2011-08-20 NOTE — ED Notes (Signed)
Pt reports sharp abd pain since Friday, Noted more in upper part of abd. Denies V/D

## 2011-08-20 NOTE — ED Notes (Signed)
Pt. Transported to floor via stretcher with tech, NAD noted

## 2011-08-20 NOTE — ED Provider Notes (Signed)
History     CSN: [id number]  Arrival date & time 08/20/11  1420   First MD Initiated Contact with Patient 08/20/11 1604      Chief Complaint  Patient presents with  . Abdominal Pain    (Consider location/radiation/quality/duration/timing/severity/associated sxs/prior treatment) HPI Comments: Patient with history of appendectomy, ventral hernia, bladder cancer-presents with two-day history of sharp upper abdominal pain. Pain is not radiating. Patient has never had pain like this in the past. It is intermittent. Pain was most severe last night. Pain is currently 8/10. It is not associated with fevers, vomiting, anorexia, diarrhea. Patient has not noted any blood in stool or tarry black stools. Patient takes 81 mg of aspirin a day. He denies other significant alcohol or NSAID use. Palpation makes the pain worse. Nothing has made the pain better. Patient took an antacid tablets 2 days ago without relief. Family notes worsening abdominal distention over the past month. Patient is having normal bowel movements. Remote h/o bowel obstruction after appendectomy as teenager.   Patient is a 66 y.o. male presenting with abdominal pain. The history is provided by the patient.  Abdominal Pain The primary symptoms of the illness include abdominal pain. The primary symptoms of the illness do not include fever, fatigue, shortness of breath, nausea, vomiting, diarrhea or dysuria. The current episode started 2 days ago. The onset of the illness was gradual.  Symptoms associated with the illness do not include chills, anorexia, heartburn, constipation or frequency.    Past Medical History  Diagnosis Date  . Skin cancer (melanoma) 1996    Left/ Back  . Kidney stones     Past Surgical History  Procedure Date  . 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 Hernia repair   . Knee arthroscopy     Left knee  . Colonoscopy 07/2002    neg  . Lumbar laminectomy 2004  . Shoulder surgery 05/2008     Family History  Problem Relation Age of Onset  . Diabetes Mother   . Heart attack Mother 38  . Heart attack Brother 54  . Cancer Brother     Lymph node/neck  . Breast cancer Sister     History  Substance Use Topics  . Smoking status: Never Smoker   . Smokeless tobacco: Not on file  . Alcohol Use: No      Review of Systems  Constitutional: Negative for fever, chills and fatigue.  HENT: Negative for sore throat and rhinorrhea.   Eyes: Negative for redness.  Respiratory: Negative for cough and shortness of breath.   Cardiovascular: Negative for chest pain.  Gastrointestinal: Positive for abdominal pain and abdominal distention. Negative for heartburn, nausea, vomiting, diarrhea, constipation, blood in stool and anorexia.  Genitourinary: Negative for dysuria and frequency.  Musculoskeletal: Negative for myalgias.  Skin: Negative for rash.  Neurological: Negative for headaches.    Allergies  Review of patient's allergies indicates no known allergies.  Home Medications   Current Outpatient Rx  Name Route Sig Dispense Refill  . ASPIRIN 81 MG PO TABS Oral Take 81 mg by mouth daily.      Marland Kitchen BETAXOLOL HCL 0.25 % OP SUSP Both Eyes Place 1 drop into both eyes 2 (two) times daily.      Marland Kitchen CARVEDILOL 25 MG PO TABS  TAKE 1 TABLET BY MOUTH TWICE A DAY 180 tablet 1  . VITAMIN D 1000 UNITS PO CAPS Oral Take 1,000 Units by mouth daily.      Marland Kitchen FLAX  SEED OIL 1000 MG PO CAPS Oral Take 1,000 mg by mouth once.      Marland Kitchen LISINOPRIL 20 MG PO TABS  TAKE 2 TABLETS BY MOUTH EVERY DAY 180 tablet 2  . NASCOBAL 500 MCG/0.1ML NA SOLN  USE 1 SPRAY IN ONE NOSTRIL ONCE A WEEK 3 Bottle 3    PATIENT WOULD LIKE NEW RX FOR A 3 MONTH SUPPLY ON  ...  . PRAVASTATIN SODIUM 40 MG PO TABS Oral Take 1 tablet (40 mg total) by mouth daily. 90 tablet 0  . SAW PALMETTO (SERENOA REPENS) 160 MG PO CAPS Oral Take 160 mg by mouth 2 (two) times daily.        BP 146/69  Pulse 61  Temp(Src) 98 F (36.7 C) (Oral)  Resp  16  SpO2 97%  Physical Exam  Nursing note and vitals reviewed. Constitutional: He appears well-developed and well-nourished.  HENT:  Head: Normocephalic and atraumatic.  Eyes: Conjunctivae are normal. Right eye exhibits no discharge. Left eye exhibits no discharge.  Neck: Normal range of motion. Neck supple.  Cardiovascular: Normal rate, regular rhythm and normal heart sounds.   Pulmonary/Chest: Effort normal and breath sounds normal.  Abdominal: Soft. He exhibits distension. There is tenderness. There is no rigidity, no rebound, no guarding, no CVA tenderness, no tenderness at McBurney's point and negative Murphy's sign.    Neurological: He is alert.  Skin: Skin is warm and dry.  Psychiatric: He has a normal mood and affect.    ED Course  Procedures (including critical care time)  Labs Reviewed  COMPREHENSIVE METABOLIC PANEL - Abnormal; Notable for the following:    Glucose, Bld 120 (*)    GFR calc non Af Amer 68 (*)    GFR calc Af Amer 79 (*)    All other components within normal limits  URINALYSIS, ROUTINE W REFLEX MICROSCOPIC  CBC  DIFFERENTIAL  LIPASE, BLOOD  LACTIC ACID, PLASMA   Ct Abdomen Pelvis W Contrast  08/20/2011  *RADIOLOGY REPORT*  Clinical Data: Abdominal pain.  Question small bowel obstruction.  CT ABDOMEN AND PELVIS WITH CONTRAST  Technique:  Multidetector CT imaging of the abdomen and pelvis was performed following the standard protocol during bolus administration of intravenous contrast.  Contrast: OMNIPAQUE IOHEXOL 300 MG/ML  SOLN  Comparison: Plain films of the abdomen earlier this same day and CT abdomen and pelvis 12/26/2010.  Findings: Mild dependent atelectasis is seen in the lung bases.  No pleural or pericardial effusion.  The patient has a small bowel obstruction with proximal loops dilated up to 3.5 cm and air fluid levels.  The transition point is in the right lower quadrant of the abdomen where the mesentery has a swirled appearance.   Transition point is best seen on images 59 to 64.  There is no pneumatosis, portal venous gas or free air. Small amount of interloop fluid in the right lower quadrant is noted.  The colon is completely decompressed but otherwise unremarkable.  The liver is low attenuating consistent with fatty change.  No focal liver lesion.  The gallbladder, adrenal glands, spleen and pancreas appear normal.  Bilateral renal cysts identified.  Kidneys are otherwise unremarkable.  There is some degenerative disease of the lumbar spine.  IMPRESSION:  1.  Study positive for small bowel obstruction, either early complete or high-grade partial, due to small bowel volvulus in the right lower quadrant. No evidence of bowel ischemia. 2.  Fatty infiltration of the liver. 3.  Renal cyst.  Original Report  Authenticated By: Bernadene Bell. D'ALESSIO, M.D.   Dg Abd Acute W/chest  08/20/2011  *RADIOLOGY REPORT*  Clinical Data: Upper abdominal pain.  Ventral hernia.  ACUTE ABDOMEN SERIES (ABDOMEN 2 VIEW & CHEST 1 VIEW)  Comparison: CT of the abdomen and pelvis 12/26/2010.  Abdominal radiographs 09/30/2010.  Findings: Lung volumes are normal.  No consolidative airspace disease.  No pleural effusions.  No pneumothorax.  No pulmonary nodule or mass noted.  Pulmonary vasculature and the cardiomediastinal silhouette are within normal limits.  No pneumoperitoneum.  Spine and upright views of the abdomen demonstrate some gas and stool scattered throughout the colon.  No distal rectal gas is identified at this time.  There are several dilated loops of gas- filled small bowel noted throughout the central abdomen, largest which measures approximately 4.9 cm in diameter.  Small air fluid levels are appreciated on the upright projection.  IMPRESSION: 1.  Findings, as above, suggestive of small bowel obstruction. Given the small amount of colonic gas and stool, this may represent an early small bowel obstruction. 2.  No pneumoperitoneum at this time. 3.  No  radiographic evidence of acute cardiopulmonary disease.  Original Report Authenticated By: Florencia Reasons, M.D.     1. Small bowel obstruction      4:21 PM Patient seen and examined. Work-up initiated. Patient refuses pain medication at this time.    Vital signs reviewed and are as follows: Filed Vitals:   08/20/11 1434  BP: 146/69  Pulse: 61  Temp: 98 F (36.7 C)  Resp: 16   Possible SBO demonstrated on plain films. Patient agrees to proceed with CT abd/pelvis to further evaluate.   8:30PM Patient d/w Dr. Freida Busman who will follow-up on CT results and dispo appropriately.    MDM  Possible SBO, pending CT.         Renne Crigler, [place] 08/20/11 2333

## 2011-08-20 NOTE — ED Notes (Signed)
CT aware that pt completed oral contrast.

## 2011-08-20 NOTE — ED Provider Notes (Addendum)
Medical screening examination/treatment/procedure(s) were conducted as a shared visit with non-physician practitioner(s) and myself.  I personally evaluated the patient during the encounter  Spoke with surgeon, will come to admit  Toy Baker, MD 08/20/11 2138  Toy Baker, MD 08/20/11 2231

## 2011-08-21 ENCOUNTER — Inpatient Hospital Stay (HOSPITAL_COMMUNITY): Payer: Medicare Other

## 2011-08-21 ENCOUNTER — Encounter (HOSPITAL_COMMUNITY): Payer: Self-pay | Admitting: *Deleted

## 2011-08-21 LAB — BASIC METABOLIC PANEL
BUN: 17 mg/dL (ref 6–23)
CO2: 26 mEq/L (ref 19–32)
Calcium: 8.7 mg/dL (ref 8.4–10.5)
Chloride: 105 mEq/L (ref 96–112)
Creatinine, Ser: 1.08 mg/dL (ref 0.50–1.35)
GFR calc Af Amer: 81 mL/min — ABNORMAL LOW (ref >90)
GFR calc non Af Amer: 70 mL/min — ABNORMAL LOW (ref >90)
Glucose, Bld: 102 mg/dL — ABNORMAL HIGH (ref 70–99)
Potassium: 4.5 mEq/L (ref 3.5–5.1)
Sodium: 138 mEq/L (ref 135–145)

## 2011-08-21 LAB — GLUCOSE, CAPILLARY
Glucose-Capillary: 117 mg/dL — ABNORMAL HIGH (ref 70–99)
Glucose-Capillary: 102 mg/dL — ABNORMAL HIGH (ref 70–99)
Glucose-Capillary: 91 mg/dL (ref 70–99)
Glucose-Capillary: 68 mg/dL — ABNORMAL LOW (ref 70–99)
Glucose-Capillary: 112 mg/dL — ABNORMAL HIGH (ref 70–99)
Glucose-Capillary: 78 mg/dL (ref 70–99)
Glucose-Capillary: 75 mg/dL (ref 70–99)

## 2011-08-21 LAB — CBC
HCT: 37.5 % — ABNORMAL LOW (ref 39.0–52.0)
Hemoglobin: 12.3 g/dL — ABNORMAL LOW (ref 13.0–17.0)
MCH: 28.5 pg (ref 26.0–34.0)
MCHC: 32.8 g/dL (ref 30.0–36.0)
MCV: 86.8 fL (ref 78.0–100.0)
Platelets: 163 10*3/uL (ref 150–400)
RBC: 4.32 MIL/uL (ref 4.22–5.81)
RDW: 13 % (ref 11.5–15.5)
WBC: 7.1 10*3/uL (ref 4.0–10.5)

## 2011-08-21 MED ORDER — CHLORHEXIDINE GLUCONATE 0.12 % MT SOLN
15.0000 mL | Freq: Two times a day (BID) | OROMUCOSAL | Status: DC
  Administered 2011-08-21 – 2011-09-01 (×19): 15 mL via OROMUCOSAL
  Filled 2011-08-21 (×25): qty 15

## 2011-08-21 MED ORDER — DEXTROSE 50 % IV SOLN
25.0000 mL | Freq: Once | INTRAVENOUS | Status: AC | PRN
  Administered 2011-08-21: 15 mL via INTRAVENOUS
  Filled 2011-08-21: qty 50

## 2011-08-21 MED ORDER — METOPROLOL TARTRATE 1 MG/ML IV SOLN
5.0000 mg | Freq: Four times a day (QID) | INTRAVENOUS | Status: DC
  Administered 2011-08-21 – 2011-08-26 (×20): 5 mg via INTRAVENOUS
  Filled 2011-08-21 (×25): qty 5

## 2011-08-21 MED ORDER — BIOTENE DRY MOUTH MT LIQD
15.0000 mL | Freq: Two times a day (BID) | OROMUCOSAL | Status: DC
  Administered 2011-08-21 – 2011-08-30 (×14): 15 mL via OROMUCOSAL

## 2011-08-21 NOTE — Progress Notes (Signed)
Subjective: Doesn't feel any better.  Objective: Vital signs in last 24 hours: Temp:  [97.6 F (36.4 C)-98.3 F (36.8 C)] 97.8 F (36.6 C) (06/03 1000) Pulse Rate:  [56-66] 66  (06/03 1000) Resp:  [16-20] 19  (06/03 1000) BP: (146-205)/(69-88) 171/86 mmHg (06/03 1000) SpO2:  [95 %-99 %] 96 % (06/03 1000) Weight:  [96.616 kg (213 lb)] 96.616 kg (213 lb) (06/02 2355) Last BM Date: 08/20/11  NG 400 yesterday,afbebrile, VSS, BP up some, labs OK, film shows ongoing PSBO.  There is contrast in the ascending colon. NO flatus  Intake/Output from previous day: 06/02 0701 - 06/03 0700 In: 887.5 [I.V.:887.5] Out: 400 [Emesis/NG output:400] Intake/Output this shift: Total I/O In: 0  Out: 300 [Urine:300]  General appearance: alert, cooperative and no distress Resp: clear to auscultation bilaterally GI: very distended, not really tender, abdomen hurts on and off at times.  Lab Results:   Basename 08/21/11 0426 08/20/11 1630  WBC 7.1 7.0  HGB 12.3* 14.0  HCT 37.5* 41.8  PLT 163 178    BMET  Basename 08/21/11 0426 08/20/11 1630  NA 138 137  K 4.5 4.3  CL 105 103  CO2 26 26  GLUCOSE 102* 120*  BUN 17 20  CREATININE 1.08 1.10  CALCIUM 8.7 9.0   PT/INR No results found for this basename: LABPROT:2,INR:2 in the last 72 hours   Lab 08/20/11 1630  AST 27  ALT 44  ALKPHOS 77  BILITOT 0.4  PROT 7.1  ALBUMIN 3.6     Lipase     Component Value Date/Time   LIPASE 25 08/20/2011 1630     Studies/Results: Ct Abdomen Pelvis W Contrast  08/20/2011  *RADIOLOGY REPORT*  Clinical Data: Abdominal pain.  Question small bowel obstruction.  CT ABDOMEN AND PELVIS WITH CONTRAST  Technique:  Multidetector CT imaging of the abdomen and pelvis was performed following the standard protocol during bolus administration of intravenous contrast.  Contrast: OMNIPAQUE IOHEXOL 300 MG/ML  SOLN  Comparison: Plain films of the abdomen earlier this same day and CT abdomen and pelvis  12/26/2010.  Findings: Mild dependent atelectasis is seen in the lung bases.  No pleural or pericardial effusion.  The patient has a small bowel obstruction with proximal loops dilated up to 3.5 cm and air fluid levels.  The transition point is in the right lower quadrant of the abdomen where the mesentery has a swirled appearance.  Transition point is best seen on images 59 to 64.  There is no pneumatosis, portal venous gas or free air. Small amount of interloop fluid in the right lower quadrant is noted.  The colon is completely decompressed but otherwise unremarkable.  The liver is low attenuating consistent with fatty change.  No focal liver lesion.  The gallbladder, adrenal glands, spleen and pancreas appear normal.  Bilateral renal cysts identified.  Kidneys are otherwise unremarkable.  There is some degenerative disease of the lumbar spine.  IMPRESSION:  1.  Study positive for small bowel obstruction, either early complete or high-grade partial, due to small bowel volvulus in the right lower quadrant. No evidence of bowel ischemia. 2.  Fatty infiltration of the liver. 3.  Renal cyst.  Original Report Authenticated By: Bernadene Bell. Maricela Curet, M.D.   Dg Abd 2 Views  08/21/2011  *RADIOLOGY REPORT*  Clinical Data: Small bowel obstruction.  Abdominal distension and lower abdominal pain.  Bladder cancer.  Skin cancer.  Anemia.  ABDOMEN - 2 VIEW  Comparison: 08/20/2011  Findings: Nasogastric tube has  been placed.  Tip overlies the level of the distal stomach.  There are persistently dilated small bowel loops, some of which contain contrast following prior CT exam. Contrast is also identified within the ascending colon, nondilated. No evidence for free intraperitoneal air.  IMPRESSION:  1.  Persistent dilatation of small bowel loops. 2.  Findings are consistent with partial small bowel obstruction, based on presence of contrast in the ascending colon. 3.  No evidence for free air.  Original Report Authenticated By:  Patterson Hammersmith, M.D.   Dg Abd Acute W/chest  08/20/2011  *RADIOLOGY REPORT*  Clinical Data: Upper abdominal pain.  Ventral hernia.  ACUTE ABDOMEN SERIES (ABDOMEN 2 VIEW & CHEST 1 VIEW)  Comparison: CT of the abdomen and pelvis 12/26/2010.  Abdominal radiographs 09/30/2010.  Findings: Lung volumes are normal.  No consolidative airspace disease.  No pleural effusions.  No pneumothorax.  No pulmonary nodule or mass noted.  Pulmonary vasculature and the cardiomediastinal silhouette are within normal limits.  No pneumoperitoneum.  Spine and upright views of the abdomen demonstrate some gas and stool scattered throughout the colon.  No distal rectal gas is identified at this time.  There are several dilated loops of gas- filled small bowel noted throughout the central abdomen, largest which measures approximately 4.9 cm in diameter.  Small air fluid levels are appreciated on the upright projection.  IMPRESSION: 1.  Findings, as above, suggestive of small bowel obstruction. Given the small amount of colonic gas and stool, this may represent an early small bowel obstruction. 2.  No pneumoperitoneum at this time. 3.  No radiographic evidence of acute cardiopulmonary disease.  Original Report Authenticated By: Florencia Reasons, M.D.    Medications:    . antiseptic oral rinse  15 mL Mouth Rinse q12n4p  . betaxolol  1 drop Both Eyes BID  . chlorhexidine  15 mL Mouth Rinse BID  . enoxaparin  40 mg Subcutaneous Q24H  . insulin aspart  0-15 Units Subcutaneous Q4H  .  morphine injection  4 mg Intravenous Once  . ondansetron (ZOFRAN) IV  4 mg Intravenous Once  . 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 Anemia lovenox for DVT .   Plan:  Continue NG, add lopresor, he's on Coreg 25 mg/bid at home.       LOS: 1 day    Vergene Marland 08/21/2011

## 2011-08-21 NOTE — Progress Notes (Signed)
UR complete 

## 2011-08-21 NOTE — Progress Notes (Signed)
Patient interviewed and examined, agree with PA note above.  His abdomen remains distended but non tender. We will repeat films in the morning. Unless there is clear improvement I believe he will need laparotomy.  Mariella Saa MD, FACS  08/21/2011 7:39 PM

## 2011-08-22 ENCOUNTER — Inpatient Hospital Stay (HOSPITAL_COMMUNITY): Payer: Medicare Other

## 2011-08-22 LAB — GLUCOSE, CAPILLARY
Glucose-Capillary: 70 mg/dL (ref 70–99)
Glucose-Capillary: 73 mg/dL (ref 70–99)
Glucose-Capillary: 77 mg/dL (ref 70–99)
Glucose-Capillary: 79 mg/dL (ref 70–99)
Glucose-Capillary: 79 mg/dL (ref 70–99)
Glucose-Capillary: 79 mg/dL (ref 70–99)

## 2011-08-22 NOTE — Progress Notes (Signed)
Subjective: Say he feels better, some flatus last PM.  He does not feel as distended as he was.  Objective: Vital signs in last 24 hours: Temp:  [97.6 F (36.4 C)-99.1 F (37.3 C)] 97.6 F (36.4 C) (06/04 0615) Pulse Rate:  [60-66] 65  (06/04 0615) Resp:  [19-20] 20  (06/04 0615) BP: (165-183)/(79-92) 168/79 mmHg (06/04 0615) SpO2:  [96 %-97 %] 96 % (06/04 0615) Last BM Date: 08/20/11  Tm 99.1, VSS, BP up mildly, no labs today   Intake/Output from previous day: 06/03 0701 - 06/04 0700 In: 3610 [I.V.:3510; NG/GT:100] Out: 3000 [Urine:3000] Intake/Output this shift:    General appearance: alert, cooperative and no distress GI: Still distended, perhaps not as much as yesterday.  +BS, +flatus  Lab Results:   Basename 08/21/11 0426 08/20/11 1630  WBC 7.1 7.0  HGB 12.3* 14.0  HCT 37.5* 41.8  PLT 163 178    BMET  Basename 08/21/11 0426 08/20/11 1630  NA 138 137  K 4.5 4.3  CL 105 103  CO2 26 26  GLUCOSE 102* 120*  BUN 17 20  CREATININE 1.08 1.10  CALCIUM 8.7 9.0   PT/INR No results found for this basename: LABPROT:2,INR:2 in the last 72 hours   Lab 08/20/11 1630  AST 27  ALT 44  ALKPHOS 77  BILITOT 0.4  PROT 7.1  ALBUMIN 3.6     Lipase     Component Value Date/Time   LIPASE 25 08/20/2011 1630     Studies/Results: Ct Abdomen Pelvis W Contrast  08/20/2011  *RADIOLOGY REPORT*  Clinical Data: Abdominal pain.  Question small bowel obstruction.  CT ABDOMEN AND PELVIS WITH CONTRAST  Technique:  Multidetector CT imaging of the abdomen and pelvis was performed following the standard protocol during bolus administration of intravenous contrast.  Contrast: OMNIPAQUE IOHEXOL 300 MG/ML  SOLN  Comparison: Plain films of the abdomen earlier this same day and CT abdomen and pelvis 12/26/2010.  Findings: Mild dependent atelectasis is seen in the lung bases.  No pleural or pericardial effusion.  The patient has a small bowel obstruction with proximal loops dilated  up to 3.5 cm and air fluid levels.  The transition point is in the right lower quadrant of the abdomen where the mesentery has a swirled appearance.  Transition point is best seen on images 59 to 64.  There is no pneumatosis, portal venous gas or free air. Small amount of interloop fluid in the right lower quadrant is noted.  The colon is completely decompressed but otherwise unremarkable.  The liver is low attenuating consistent with fatty change.  No focal liver lesion.  The gallbladder, adrenal glands, spleen and pancreas appear normal.  Bilateral renal cysts identified.  Kidneys are otherwise unremarkable.  There is some degenerative disease of the lumbar spine.  IMPRESSION:  1.  Study positive for small bowel obstruction, either early complete or high-grade partial, due to small bowel volvulus in the right lower quadrant. No evidence of bowel ischemia. 2.  Fatty infiltration of the liver. 3.  Renal cyst.  Original Report Authenticated By: Bernadene Bell. Maricela Curet, M.D.   Dg Abd 2 Views  08/21/2011  *RADIOLOGY REPORT*  Clinical Data: Small bowel obstruction.  Abdominal distension and lower abdominal pain.  Bladder cancer.  Skin cancer.  Anemia.  ABDOMEN - 2 VIEW  Comparison: 08/20/2011  Findings: Nasogastric tube has been placed.  Tip overlies the level of the distal stomach.  There are persistently dilated small bowel loops, some of which contain  contrast following prior CT exam. Contrast is also identified within the ascending colon, nondilated. No evidence for free intraperitoneal air.  IMPRESSION:  1.  Persistent dilatation of small bowel loops. 2.  Findings are consistent with partial small bowel obstruction, based on presence of contrast in the ascending colon. 3.  No evidence for free air.  Original Report Authenticated By: Patterson Hammersmith, M.D.   Dg Abd Acute W/chest  08/20/2011  *RADIOLOGY REPORT*  Clinical Data: Upper abdominal pain.  Ventral hernia.  ACUTE ABDOMEN SERIES (ABDOMEN 2 VIEW & CHEST 1  VIEW)  Comparison: CT of the abdomen and pelvis 12/26/2010.  Abdominal radiographs 09/30/2010.  Findings: Lung volumes are normal.  No consolidative airspace disease.  No pleural effusions.  No pneumothorax.  No pulmonary nodule or mass noted.  Pulmonary vasculature and the cardiomediastinal silhouette are within normal limits.  No pneumoperitoneum.  Spine and upright views of the abdomen demonstrate some gas and stool scattered throughout the colon.  No distal rectal gas is identified at this time.  There are several dilated loops of gas- filled small bowel noted throughout the central abdomen, largest which measures approximately 4.9 cm in diameter.  Small air fluid levels are appreciated on the upright projection.  IMPRESSION: 1.  Findings, as above, suggestive of small bowel obstruction. Given the small amount of colonic gas and stool, this may represent an early small bowel obstruction. 2.  No pneumoperitoneum at this time. 3.  No radiographic evidence of acute cardiopulmonary disease.  Original Report Authenticated By: Florencia Reasons, M.D.    Medications:    . antiseptic oral rinse  15 mL Mouth Rinse q12n4p  . betaxolol  1 drop Both Eyes BID  . chlorhexidine  15 mL Mouth Rinse BID  . enoxaparin  40 mg Subcutaneous Q24H  . insulin aspart  0-15 Units Subcutaneous Q4H  . metoprolol  5 mg Intravenous Q6H  . 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  Anemia  lovenox for DVT   Plan:  Film this AM, He seems to be doing better, but nothing from NG, film yesterday shows it in the distal stomach.  Ng pulled back about 2 inches.  Going for study now.     LOS: 2 days    Katherin Ramey 08/22/2011

## 2011-08-22 NOTE — Progress Notes (Signed)
Patient interviewed and examined, agree with PA note above. His abdomen seems slightly less distended. He states he is having less pain. There appears to be a little improvement although clearly not resolved. We'll hold on surgery today and repeat x-rays tomorrow. Plan laparotomy if there is not clear radiographic improvement. Mariella Saa MD, FACS  08/22/2011 6:32 PM

## 2011-08-23 ENCOUNTER — Encounter (HOSPITAL_COMMUNITY): Admission: EM | Disposition: A | Discharge status: Home / Self Care | Payer: Self-pay | Source: Home / Self Care

## 2011-08-23 ENCOUNTER — Inpatient Hospital Stay (HOSPITAL_COMMUNITY): Payer: Medicare Other

## 2011-08-23 ENCOUNTER — Encounter (HOSPITAL_COMMUNITY): Payer: Self-pay | Admitting: Anesthesiology

## 2011-08-23 ENCOUNTER — Inpatient Hospital Stay (HOSPITAL_COMMUNITY): Payer: Medicare Other | Admitting: Anesthesiology

## 2011-08-23 DIAGNOSIS — K56609 Unspecified intestinal obstruction, unspecified as to partial versus complete obstruction: Secondary | ICD-10-CM | POA: Diagnosis present

## 2011-08-23 DIAGNOSIS — K565 Intestinal adhesions [bands], unspecified as to partial versus complete obstruction: Secondary | ICD-10-CM

## 2011-08-23 HISTORY — PX: LAPAROTOMY: SHX154

## 2011-08-23 HISTORY — DX: Unspecified intestinal obstruction, unspecified as to partial versus complete obstruction: K56.609

## 2011-08-23 HISTORY — PX: ABDOMINAL ADHESION SURGERY: SHX90

## 2011-08-23 LAB — GLUCOSE, CAPILLARY
Glucose-Capillary: 101 mg/dL — ABNORMAL HIGH (ref 70–99)
Glucose-Capillary: 105 mg/dL — ABNORMAL HIGH (ref 70–99)
Glucose-Capillary: 111 mg/dL — ABNORMAL HIGH (ref 70–99)
Glucose-Capillary: 66 mg/dL — ABNORMAL LOW (ref 70–99)
Glucose-Capillary: 77 mg/dL (ref 70–99)
Glucose-Capillary: 77 mg/dL (ref 70–99)
Glucose-Capillary: 81 mg/dL (ref 70–99)
Glucose-Capillary: 88 mg/dL (ref 70–99)

## 2011-08-23 LAB — MRSA PCR SCREENING: MRSA by PCR: NEGATIVE

## 2011-08-23 SURGERY — LAPAROTOMY, EXPLORATORY
Anesthesia: General | Site: Abdomen | Wound class: Clean

## 2011-08-23 MED ORDER — CHLORHEXIDINE GLUCONATE CLOTH 2 % EX PADS
6.0000 | MEDICATED_PAD | Freq: Once | CUTANEOUS | Status: AC
  Administered 2011-08-23: 6 via TOPICAL

## 2011-08-23 MED ORDER — PROMETHAZINE HCL 25 MG/ML IJ SOLN: 6.2500 mg | INTRAMUSCULAR | Status: DC | PRN

## 2011-08-23 MED ORDER — SUCCINYLCHOLINE CHLORIDE 20 MG/ML IJ SOLN
INTRAMUSCULAR | Status: DC | PRN
  Administered 2011-08-23: 140 mg via INTRAVENOUS

## 2011-08-23 MED ORDER — ROCURONIUM BROMIDE 100 MG/10ML IV SOLN
INTRAVENOUS | Status: DC | PRN
  Administered 2011-08-23: 30 mg via INTRAVENOUS
  Administered 2011-08-23 (×2): 10 mg via INTRAVENOUS

## 2011-08-23 MED ORDER — GLYCOPYRROLATE 0.2 MG/ML IJ SOLN
INTRAMUSCULAR | Status: DC | PRN
  Administered 2011-08-23: .7 mg via INTRAVENOUS

## 2011-08-23 MED ORDER — LACTATED RINGERS IV SOLN
INTRAVENOUS | Status: DC | PRN
  Administered 2011-08-23 (×2): via INTRAVENOUS

## 2011-08-23 MED ORDER — HYDROMORPHONE HCL PF 1 MG/ML IJ SOLN: 0.2500 mg | INTRAMUSCULAR | Status: DC | PRN

## 2011-08-23 MED ORDER — PROPOFOL 10 MG/ML IV BOLUS
INTRAVENOUS | Status: DC | PRN
  Administered 2011-08-23: 200 mg via INTRAVENOUS

## 2011-08-23 MED ORDER — MIDAZOLAM HCL 5 MG/5ML IJ SOLN
INTRAMUSCULAR | Status: DC | PRN
  Administered 2011-08-23: 2 mg via INTRAVENOUS

## 2011-08-23 MED ORDER — PIPERACILLIN-TAZOBACTAM 3.375 G IVPB
3.3750 g | Freq: Three times a day (TID) | INTRAVENOUS | Status: DC
  Administered 2011-08-23 (×2): 3.375 g via INTRAVENOUS
  Filled 2011-08-23 (×2): qty 50

## 2011-08-23 MED ORDER — HYDROMORPHONE HCL PF 1 MG/ML IJ SOLN
1.0000 mg | INTRAMUSCULAR | Status: DC | PRN
  Administered 2011-08-23: 1 mg via INTRAVENOUS
  Administered 2011-08-23 – 2011-08-24 (×2): 2 mg via INTRAVENOUS
  Administered 2011-08-24: 1 mg via INTRAVENOUS
  Administered 2011-08-24 (×2): 2 mg via INTRAVENOUS
  Administered 2011-08-24: 3 mg via INTRAVENOUS
  Administered 2011-08-24: 2 mg via INTRAVENOUS
  Administered 2011-08-25: 3 mg via INTRAVENOUS
  Administered 2011-08-25: 1 mg via INTRAVENOUS
  Administered 2011-08-25: 3 mg via INTRAVENOUS
  Administered 2011-08-25 – 2011-08-29 (×13): 1 mg via INTRAVENOUS
  Filled 2011-08-23: qty 2
  Filled 2011-08-23 (×3): qty 1
  Filled 2011-08-23 (×2): qty 3
  Filled 2011-08-23 (×3): qty 1
  Filled 2011-08-23: qty 2
  Filled 2011-08-23 (×2): qty 1
  Filled 2011-08-23: qty 2
  Filled 2011-08-23: qty 1
  Filled 2011-08-23: qty 2
  Filled 2011-08-23: qty 1
  Filled 2011-08-23: qty 2
  Filled 2011-08-23 (×5): qty 1
  Filled 2011-08-23: qty 2
  Filled 2011-08-23: qty 3
  Filled 2011-08-23: qty 1

## 2011-08-23 MED ORDER — HYDROMORPHONE HCL PF 1 MG/ML IJ SOLN
INTRAMUSCULAR | Status: DC | PRN
  Administered 2011-08-23: 0.5 mg via INTRAVENOUS
  Administered 2011-08-23: 1 mg via INTRAVENOUS
  Administered 2011-08-23: 0.5 mg via INTRAVENOUS

## 2011-08-23 MED ORDER — FENTANYL CITRATE 0.05 MG/ML IJ SOLN
INTRAMUSCULAR | Status: DC | PRN
  Administered 2011-08-23: 100 ug via INTRAVENOUS
  Administered 2011-08-23: 50 ug via INTRAVENOUS
  Administered 2011-08-23: 100 ug via INTRAVENOUS

## 2011-08-23 MED ORDER — DEXTROSE 50 % IV SOLN
25.0000 mL | Freq: Once | INTRAVENOUS | Status: AC | PRN
  Administered 2011-08-23: 10 mL via INTRAVENOUS
  Filled 2011-08-23 (×2): qty 50

## 2011-08-23 MED ORDER — POTASSIUM CHLORIDE IN NACL 20-0.9 MEQ/L-% IV SOLN
INTRAVENOUS | Status: DC
  Administered 2011-08-23: 21:00:00 via INTRAVENOUS
  Administered 2011-08-24: 125 mL/h via INTRAVENOUS
  Administered 2011-08-24 – 2011-08-28 (×5): via INTRAVENOUS
  Administered 2011-08-28: 1000 mL via INTRAVENOUS
  Administered 2011-08-29 – 2011-08-30 (×3): via INTRAVENOUS
  Filled 2011-08-23 (×16): qty 1000

## 2011-08-23 MED ORDER — HYDROMORPHONE HCL PF 1 MG/ML IJ SOLN
0.2500 mg | INTRAMUSCULAR | Status: DC | PRN
  Administered 2011-08-23 (×2): 0.5 mg via INTRAVENOUS

## 2011-08-23 MED ORDER — NEOSTIGMINE METHYLSULFATE 1 MG/ML IJ SOLN
INTRAMUSCULAR | Status: DC | PRN
  Administered 2011-08-23: 5 mg via INTRAVENOUS

## 2011-08-23 SURGICAL SUPPLY — 33 items
APPLICATOR COTTON TIP 6IN STRL (MISCELLANEOUS) IMPLANT
BLADE EXTENDED COATED 6.5IN (ELECTRODE) ×2 IMPLANT
BLADE HEX COATED 2.75 (ELECTRODE) ×2 IMPLANT
CANISTER SUCTION 2500CC (MISCELLANEOUS) ×2 IMPLANT
CLOTH BEACON ORANGE TIMEOUT ST (SAFETY) ×2 IMPLANT
COVER MAYO STAND STRL (DRAPES) ×1 IMPLANT
DRAPE LAPAROSCOPIC ABDOMINAL (DRAPES) ×2 IMPLANT
DRAPE WARM FLUID 44X44 (DRAPE) ×1 IMPLANT
ELECT REM PT RETURN 9FT ADLT (ELECTROSURGICAL) ×2
ELECTRODE REM PT RTRN 9FT ADLT (ELECTROSURGICAL) ×1 IMPLANT
GLOVE BIOGEL PI IND STRL 7.0 (GLOVE) ×5 IMPLANT
GLOVE BIOGEL PI INDICATOR 7.0 (GLOVE) ×5
GOWN STRL NON-REIN LRG LVL3 (GOWN DISPOSABLE) ×3 IMPLANT
GOWN STRL REIN XL XLG (GOWN DISPOSABLE) ×4 IMPLANT
KIT BASIN OR (CUSTOM PROCEDURE TRAY) ×2 IMPLANT
NS IRRIG 1000ML POUR BTL (IV SOLUTION) ×4 IMPLANT
PACK GENERAL/GYN (CUSTOM PROCEDURE TRAY) ×2 IMPLANT
SPONGE GAUZE 4X4 12PLY (GAUZE/BANDAGES/DRESSINGS) ×2 IMPLANT
SPONGE LAP 18X18 X RAY DECT (DISPOSABLE) IMPLANT
STAPLER VISISTAT 35W (STAPLE) ×2 IMPLANT
SUCTION POOLE TIP (SUCTIONS) ×1 IMPLANT
SUT PDS AB 1 CTX 36 (SUTURE) IMPLANT
SUT PDS AB 1 TP1 96 (SUTURE) ×2 IMPLANT
SUT SILK 2 0 (SUTURE) ×2
SUT SILK 2 0 SH CR/8 (SUTURE) ×1 IMPLANT
SUT SILK 2-0 18XBRD TIE 12 (SUTURE) IMPLANT
SUT SILK 3 0 (SUTURE) ×2
SUT SILK 3 0 SH CR/8 (SUTURE) ×2 IMPLANT
SUT SILK 3-0 18XBRD TIE 12 (SUTURE) IMPLANT
TAPE CLOTH SURG 4X10 WHT LF (GAUZE/BANDAGES/DRESSINGS) ×1 IMPLANT
TOWEL OR 17X26 10 PK STRL BLUE (TOWEL DISPOSABLE) ×4 IMPLANT
TRAY FOLEY CATH 14FRSI W/METER (CATHETERS) ×1 IMPLANT
YANKAUER SUCT BULB TIP NO VENT (SUCTIONS) ×1 IMPLANT

## 2011-08-23 NOTE — Op Note (Signed)
Preoperative Diagnosis: Small bowel obstruction [560.9] ABDOMINAL PAIN small bowel obstruction  Postoprative Diagnosis: Small bowel obstruction [560.9] ABDOMINAL PAIN small bowel obstruction  Procedure: Procedure(s): EXPLORATORY LAPAROTOMY and lysis of adhesions for small bowel obstruction   Surgeon: Glenna Fellows T   Assistants: none  Anesthesia:  General endotracheal anesthesiaDiagnos  Indications:   Patient is a 66 year old male with a remote history of perforated appendicitis and then subsequent laparotomy through a low midline incision in the early postoperative period at age 37 for small bowel obstruction. He has had no further problems untill this hospitalization when he presented with nausea, vomiting and abdominal pain and x-ray findings of high-grade partial small bowel obstruction. With several days of observation and NG suction this has failed to resolve and I have recommended proceeding with laparotomy. We discussed complications and indications detailed elsewhere and he is in agreement.  Procedure Detail:  Patient is brought to the operating room, placed in the supine position on the operating table, and general endotracheal anesthesia induced. He received preoperative IV antibiotics. The abdomen was widely sterilely prepped and draped. Patient timeout was performed and correct patient and procedure verified. I used the previous low midline incision and extended this up to adjacent to the umbilicus. Dissection was carried down to subcutaneous tissue and midline fascia using cautery. The peritoneum was entered under direct vision. There was a moderate amount of clear serous fluid. There were minimal adhesions of mostly omentum and one loop of small bowel to the anterior abdominal wall near the incision that were taken down with cautery and sharp dissection and the entire anterior abdominal wall cleared. A few adhesions of the omentum down into the pelvis and 2 loops of bowel  were lysed and the omentum mobilized off of the bowel. There was clearly significantly dilated proximal small bowel and completely decompressed terminal ileum. I located the ileocecal valve which appeared normal and traced the small bowel proximally. At about the mid ileum the small bowel was markedly tethered down to the retroperitoneum with adhesions from the mesentery near the bowel down to the retroperitoneum. This was clearly the point of obstruction with dilated bowel proximal to this. These adhesions were mobilized freeing the small bowel up out of the retroperitoneum. At this point I ran the entire small bowel again in both directions and this clearly had been the point of obstruction and there were no other abnormalities and the obstruction was relieved. The abdomen was irrigated and hemostasis assured. The viscera returned to their anatomic position and the omentum draped over the small bowel and to the incision. The midline fascia was closed with looped #1 PDS begun at either end of the incision and tied centrally. The subcutaneous tissue was irrigated and skin closed with staples. Sponge needle and ischemic counts were correct.  Findings: Small bowel obstruction secondary to mesenteric adhesions  Estimated Blood Loss:  less than 50 mL         Drains: none  Blood Given: none          Specimens: none        Complications:  * No complications entered in OR log *         Disposition: PACU - hemodynamically stable.         Condition: stable  Mariella Saa MD, FACS  08/23/2011, 5:02 PM

## 2011-08-23 NOTE — Anesthesia Postprocedure Evaluation (Signed)
  Anesthesia Post-op Note  Patient: Donald Carlson  Procedure(s) Performed: Procedure(s) (LRB): EXPLORATORY LAPAROTOMY (N/A)  Patient Location: PACU  Anesthesia Type: General  Level of Consciousness: awake and alert   Airway and Oxygen Therapy: Patient Spontanous Breathing  Post-op Pain: mild  Post-op Assessment: Post-op Vital signs reviewed, Patient's Cardiovascular Status Stable, Respiratory Function Stable, Patent Airway and No signs of Nausea or vomiting  Post-op Vital Signs: stable  Complications: No apparent anesthesia complications. Metoprolol given for increased BP. BP now similar to preop. value

## 2011-08-23 NOTE — Transfer of Care (Signed)
Immediate Anesthesia Transfer of Care Note  Patient: Donald Carlson  Procedure(s) Performed: Procedure(s) (LRB): EXPLORATORY LAPAROTOMY (N/A)  Patient Location: PACU  Anesthesia Type: General  Level of Consciousness: awake, alert , oriented and patient cooperative  Airway & Oxygen Therapy: Patient Spontanous Breathing and Patient connected to face mask oxygen  Post-op Assessment: Report given to PACU RN, Post -op Vital signs reviewed and stable and Patient moving all extremities  Post vital signs: Reviewed and stable  Complications: No apparent anesthesia complications

## 2011-08-23 NOTE — ED Provider Notes (Signed)
Medical screening examination/treatment/procedure(s) were conducted as a shared visit with non-physician practitioner(s) and myself.  I personally evaluated the patient during the encounter  Toy Baker, MD 08/23/11 [phone]

## 2011-08-23 NOTE — Progress Notes (Signed)
Patient ID: Donald Carlson, male   DOB: 1945-08-08, 66 y.o.   MRN: [id number] See PA noted above. I have examined the patient and reviewed x-rays today. Abdominal x-ray shows persistence of partial bowel obstruction. He has not had flatus or bowel movement. His abdomen remains nontender but significantly distended. I do not see any evidence of progression and I recommended proceeding with laparotomy for bowel obstruction today. I discussed the indications for the procedure, risks of bleeding, infection, intestinal injury, anesthetic risks, recurrent obstruction, possible need for resection. Questions were answered and he and his wife agree to proceed

## 2011-08-23 NOTE — Preoperative (Signed)
Beta Blockers   Reason not to administer Beta Blockers:Hold beta blocker due to other NPO bowel obstruction

## 2011-08-23 NOTE — Progress Notes (Signed)
  Subjective:  Up in chair, just back from xray, says the bed is killing his back.  No flatus or BM  Objective: Vital signs in last 24 hours: Temp:  [97.6 F (36.4 C)-98.5 F (36.9 C)] 98 F (36.7 C) (06/05 0558) Pulse Rate:  [60-76] 61  (06/05 0637) Resp:  [19-20] 20  (06/05 0558) BP: (160-207)/(68-94) 190/78 mmHg (06/05 0637) SpO2:  [96 %-98 %] 97 % (06/05 0558) Last BM Date: 08/20/11  270 ml from NG yesterday, BP is up 160-207/68-90 range, no labs, I don't see allot of change from xray yesterday.  Intake/Output from previous day: 09-06-22 0701 - 06/05 0700 In: 3600 [I.V.:3600] Out: 3370 [Urine:3100; Emesis/NG output:270] Intake/Output this shift:    General appearance: alert, cooperative and no distress Resp: clear to auscultation bilaterally GI: Abd still distended, BS hypoactive, he is not tender, right now sitting up on chair at bedside.  Lab Results:   Basename 08/21/11 0426 08/20/11 1630  WBC 7.1 7.0  HGB 12.3* 14.0  HCT 37.5* 41.8  PLT 163 178    BMET  Basename 08/21/11 0426 08/20/11 1630  NA 138 137  K 4.5 4.3  CL 105 103  CO2 26 26  GLUCOSE 102* 120*  BUN 17 20  CREATININE 1.08 1.10  CALCIUM 8.7 9.0   PT/INR No results found for this basename: LABPROT:2,INR:2 in the last 72 hours   Lab 08/20/11 1630  AST 27  ALT 44  ALKPHOS 77  BILITOT 0.4  PROT 7.1  ALBUMIN 3.6     Lipase     Component Value Date/Time   LIPASE 25 08/20/2011 1630     Studies/Results: Dg Abd 2 Views  09/06/2011  *RADIOLOGY REPORT*  Clinical Data: Small bowel obstruction  ABDOMEN - 2 VIEW  Comparison: 08/21/2011; 08/19/2037; chest CT - 08/20/2011  Findings:  An enteric tube overlies the left upper quadrant with tip projected in the expected location of the gastric antrum.  Interval passage of previously injected enteric contrast into the colon.  There is persistent moderate gaseous distension of several loops of small bowel with the left main the hemiabdomen with index loop  measuring approximately 3.7 cm in diameter.  There is a suggestion of bowel wall thickening.  No pneumoperitoneum, pneumatosis or portal venous gas.  Limited visualization of the lower thorax suggests left basilar atelectasis.  No acute osseous abnormalities.  IMPRESSION: Overall findings compatible with persistent partial small bowel obstruction.  Original Report Authenticated By: Waynard Reeds, M.D.    Medications:    . antiseptic oral rinse  15 mL Mouth Rinse q12n4p  . betaxolol  1 drop Both Eyes BID  . chlorhexidine  15 mL Mouth Rinse BID  . enoxaparin  40 mg Subcutaneous Q24H  . insulin aspart  0-15 Units Subcutaneous Q4H  . metoprolol  5 mg Intravenous Q6H  . 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  Anemia  BMI 30.6 lovenox for DVT   Plan;  NPO, NG, discuss with DR. Hoxworth.   LOS: 3 days    Donald Carlson 08/23/2011

## 2011-08-23 NOTE — Anesthesia Preprocedure Evaluation (Addendum)
Anesthesia Evaluation  Patient identified by MRN, date of birth, ID band Patient awake    Reviewed: Allergy & Precautions, H&P , NPO status , Patient's Chart, lab work & pertinent test results  Airway Mallampati: II TM Distance: >3 FB Neck ROM: Full    Dental  (+) Edentulous Upper and Edentulous Lower   Pulmonary neg pulmonary ROS,  breath sounds clear to auscultation  Pulmonary exam normal       Cardiovascular hypertension, Pt. on home beta blockers and Pt. on medications negative cardio ROS  Rhythm:Regular Rate:Normal     Neuro/Psych negative neurological ROS  negative psych ROS   GI/Hepatic negative GI ROS, Neg liver ROS,   Endo/Other  Diabetes mellitus-  Renal/GU negative Renal ROS  negative genitourinary   Musculoskeletal negative musculoskeletal ROS (+)   Abdominal   Peds negative pediatric ROS (+)  Hematology negative hematology ROS (+)   Anesthesia Other Findings   Reproductive/Obstetrics negative OB ROS                          Anesthesia Physical Anesthesia Plan  ASA: III  Anesthesia Plan: General   Post-op Pain Management:    Induction: Intravenous  Airway Management Planned: Oral ETT  Additional Equipment:   Intra-op Plan:   Post-operative Plan: Extubation in OR  Informed Consent: I have reviewed the patients History and Physical, chart, labs and discussed the procedure including the risks, benefits and alternatives for the proposed anesthesia with the patient or authorized representative who has indicated his/her understanding and acceptance.   Dental advisory given  Plan Discussed with: CRNA  Anesthesia Plan Comments:         Anesthesia Quick Evaluation

## 2011-08-24 ENCOUNTER — Encounter (HOSPITAL_COMMUNITY): Payer: Self-pay | Admitting: General Surgery

## 2011-08-24 LAB — CBC
HCT: 40.8 % (ref 39.0–52.0)
Hemoglobin: 13.9 g/dL (ref 13.0–17.0)
MCH: 29.1 pg (ref 26.0–34.0)
MCHC: 34.1 g/dL (ref 30.0–36.0)
MCV: 85.5 fL (ref 78.0–100.0)
Platelets: 201 10*3/uL (ref 150–400)
RBC: 4.77 MIL/uL (ref 4.22–5.81)
RDW: 13 % (ref 11.5–15.5)
WBC: 9.3 10*3/uL (ref 4.0–10.5)

## 2011-08-24 LAB — GLUCOSE, CAPILLARY
Glucose-Capillary: 105 mg/dL — ABNORMAL HIGH (ref 70–99)
Glucose-Capillary: 105 mg/dL — ABNORMAL HIGH (ref 70–99)
Glucose-Capillary: 98 mg/dL (ref 70–99)
Glucose-Capillary: 93 mg/dL (ref 70–99)
Glucose-Capillary: 102 mg/dL — ABNORMAL HIGH (ref 70–99)

## 2011-08-24 LAB — BASIC METABOLIC PANEL
BUN: 9 mg/dL (ref 6–23)
CO2: 23 mEq/L (ref 19–32)
Calcium: 8.8 mg/dL (ref 8.4–10.5)
Chloride: 100 mEq/L (ref 96–112)
Creatinine, Ser: 1 mg/dL (ref 0.50–1.35)
GFR calc Af Amer: 89 mL/min — ABNORMAL LOW (ref >90)
GFR calc non Af Amer: 76 mL/min — ABNORMAL LOW (ref >90)
Glucose, Bld: 100 mg/dL — ABNORMAL HIGH (ref 70–99)
Potassium: 4.5 mEq/L (ref 3.5–5.1)
Sodium: 134 mEq/L — ABNORMAL LOW (ref 135–145)

## 2011-08-24 MED ORDER — ACETAMINOPHEN 10 MG/ML IV SOLN
1000.0000 mg | Freq: Four times a day (QID) | INTRAVENOUS | Status: AC
  Administered 2011-08-24 – 2011-08-25 (×4): 1000 mg via INTRAVENOUS
  Filled 2011-08-24 (×6): qty 100

## 2011-08-24 MED ORDER — HYDRALAZINE HCL 20 MG/ML IJ SOLN
10.0000 mg | INTRAMUSCULAR | Status: DC | PRN
  Administered 2011-08-24 – 2011-08-26 (×4): 10 mg via INTRAVENOUS
  Filled 2011-08-24 (×4): qty 1

## 2011-08-24 NOTE — Progress Notes (Signed)
1 Day Post-Op  Subjective: Feel better, but not taking much pain med so his BP is way up.  I have told to take the medicines for pain  Objective: Vital signs in last 24 hours: Temp:  [97.8 F (36.6 C)-98.7 F (37.1 C)] 97.8 F (36.6 C) (06/06 0618) Pulse Rate:  [65-96] 96  (06/06 0618) Resp:  [12-16] 16  (06/06 0618) BP: (150-221)/(84-117) 150/84 mmHg (06/06 0618) SpO2:  [93 %-100 %] 100 % (06/06 0618) Last BM Date: 08/20/11  100 recorded from NG, BP was up last PM getting better this AM, No labs,  Intake/Output from previous day: 27-Aug-2022 0701 - 06/06 0700 In: 3250 [I.V.:3250] Out: 3260 [Urine:3150; Emesis/NG output:100; Blood:10] Intake/Output this shift:    General appearance: alert, cooperative and no distress Resp: clear to auscultation bilaterally GI: slightly distended, still very tender, few BS, no flatus  Lab Results:  No results found for this basename: WBC:2,HGB:2,HCT:2,PLT:2 in the last 72 hours  BMET No results found for this basename: NA:2,K:2,CL:2,CO2:2,GLUCOSE:2,BUN:2,CREATININE:2,CALCIUM:2 in the last 72 hours PT/INR No results found for this basename: LABPROT:2,INR:2 in the last 72 hours   Lab 08/20/11 1630  AST 27  ALT 44  ALKPHOS 77  BILITOT 0.4  PROT 7.1  ALBUMIN 3.6     Lipase     Component Value Date/Time   LIPASE 25 08/20/2011 1630     Studies/Results: Dg Abd 2 Views  2011-08-27  *RADIOLOGY REPORT*  Clinical Data: Small bowel obstruction.  ABDOMEN - 2 VIEW  Comparison: 08/22/2011  Findings: NG tube has migrated distally with the tip in the distal duodenum.  Oral contrast material remains present in the right side of the colon.  Mildly prominent mid abdominal small bowel loops again noted, unchanged.  No free air.  No organomegaly.  IMPRESSION: No significant change in the partial small bowel obstruction pattern.  NG tube tip in the distal duodenum.  Original Report Authenticated By: Cyndie Chime, M.D.    Medications:    . antiseptic  oral rinse  15 mL Mouth Rinse q12n4p  . betaxolol  1 drop Both Eyes BID  . chlorhexidine  15 mL Mouth Rinse BID  . Chlorhexidine Gluconate Cloth  6 each Topical Once  . enoxaparin  40 mg Subcutaneous Q24H  . insulin aspart  0-15 Units Subcutaneous Q4H  . metoprolol  5 mg Intravenous Q6H  . pantoprazole (PROTONIX) IV  40 mg Intravenous QHS  . 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  EXPLORATORY LAPAROTOMY and lysis of adhesions for small bowel obstruction 08-27-11 DR. Hoxworth. 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  Anemia  BMI 30.6  lovenox for DVT   Plan:  Labs just being drawn, add IV tylenol, d/c foley, OOb to chair today, walk tomorrow, IS, add some prn hydralazine     LOS: 4 days    Rollin Kotowski 08/24/2011

## 2011-08-24 NOTE — Progress Notes (Signed)
UR complete 

## 2011-08-24 NOTE — Progress Notes (Signed)
Patient interviewed and examined, agree with PA note above.  Mariella Saa MD, FACS  08/24/2011 2:18 PM

## 2011-08-25 LAB — GLUCOSE, CAPILLARY
Glucose-Capillary: 119 mg/dL — ABNORMAL HIGH (ref 70–99)
Glucose-Capillary: 64 mg/dL — ABNORMAL LOW (ref 70–99)
Glucose-Capillary: 78 mg/dL (ref 70–99)
Glucose-Capillary: 82 mg/dL (ref 70–99)
Glucose-Capillary: 84 mg/dL (ref 70–99)
Glucose-Capillary: 84 mg/dL (ref 70–99)
Glucose-Capillary: 85 mg/dL (ref 70–99)
Glucose-Capillary: 90 mg/dL (ref 70–99)

## 2011-08-25 MED ORDER — DEXTROSE 50 % IV SOLN
25.0000 mL | Freq: Once | INTRAVENOUS | Status: AC | PRN
  Administered 2011-08-25: 10 mL via INTRAVENOUS
  Filled 2011-08-25: qty 50

## 2011-08-25 NOTE — Progress Notes (Signed)
Patient interviewed and examined, agree with PA note above.  Mariella Saa MD, FACS  08/25/2011 7:56 PM

## 2011-08-25 NOTE — Progress Notes (Signed)
2 Days Post-Op  Subjective: No flatus, still distended, no nausea.  Up in chair.  I offered to take NG out, but he's willing to wait till he has some bowel funcition and we know he won't need it again  Objective: Vital signs in last 24 hours: Temp:  [97.6 F (36.4 C)-98.8 F (37.1 C)] 97.6 F (36.4 C) (06/07 0544) Pulse Rate:  [71-96] 77  (06/07 0544) Resp:  [16-20] 16  (06/07 0544) BP: (150-216)/(83-110) 178/85 mmHg (06/07 0544) SpO2:  [96 %-99 %] 99 % (06/07 0544) Last BM Date: 08/20/11  150 per NG, BP still up some, but better, HR in the 80's. Labs OK yesterday  Intake/Output from previous day: 06/06 0701 - 06/07 0700 In: 1608.3 [I.V.:1508.3; NG/GT:100] Out: 2350 [Urine:2200; Emesis/NG output:150] Intake/Output this shift:    General appearance: alert, cooperative and no distress Resp: Some rales mostly Right base. GI: distended, sore, few BS, No flatus/BM  Lab Results:   [facility] 08/24/11 0947  WBC 9.3  HGB 13.9  HCT 40.8  PLT 201    BMET  Basename 08/24/11 0947  NA 134*  K 4.5  CL 100  CO2 23  GLUCOSE 100*  BUN 9  CREATININE 1.00  CALCIUM 8.8   PT/INR No results found for this basename: LABPROT:2,INR:2 in the last 72 hours   Lab 08/20/11 1630  AST 27  ALT 44  ALKPHOS 77  BILITOT 0.4  PROT 7.1  ALBUMIN 3.6     Lipase     Component Value Date/Time   LIPASE 25 08/20/2011 1630     Studies/Results: No results found.  Medications:    . acetaminophen  1,000 mg Intravenous Q6H  . antiseptic oral rinse  15 mL Mouth Rinse q12n4p  . betaxolol  1 drop Both Eyes BID  . chlorhexidine  15 mL Mouth Rinse BID  . enoxaparin  40 mg Subcutaneous Q24H  . insulin aspart  0-15 Units Subcutaneous Q4H  . metoprolol  5 mg Intravenous Q6H  . 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  EXPLORATORY  LAPAROTOMY and lysis of adhesions for small bowel obstruction 08/23/11 DR. Hoxworth.  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  Anemia  BMI 30.6  lovenox for DVT   Plan:  Continue to mobilize, and leave NG clamped.      LOS: 5 days    Shelbie Franken 08/25/2011

## 2011-08-26 ENCOUNTER — Encounter (HOSPITAL_COMMUNITY): Payer: Self-pay | Admitting: Surgery

## 2011-08-26 LAB — GLUCOSE, CAPILLARY
Glucose-Capillary: 81 mg/dL (ref 70–99)
Glucose-Capillary: 83 mg/dL (ref 70–99)
Glucose-Capillary: 87 mg/dL (ref 70–99)
Glucose-Capillary: 89 mg/dL (ref 70–99)
Glucose-Capillary: 90 mg/dL (ref 70–99)
Glucose-Capillary: 94 mg/dL (ref 70–99)

## 2011-08-26 MED ORDER — CARVEDILOL 12.5 MG PO TABS
12.5000 mg | ORAL_TABLET | Freq: Two times a day (BID) | ORAL | Status: DC
  Administered 2011-08-26 – 2011-09-01 (×12): 12.5 mg via ORAL
  Filled 2011-08-26 (×15): qty 1

## 2011-08-26 MED ORDER — ASPIRIN EC 81 MG PO TBEC
81.0000 mg | DELAYED_RELEASE_TABLET | Freq: Every day | ORAL | Status: DC
  Administered 2011-08-26 – 2011-09-01 (×7): 81 mg via ORAL
  Filled 2011-08-26 (×7): qty 1

## 2011-08-26 MED ORDER — FUROSEMIDE 10 MG/ML IJ SOLN
40.0000 mg | Freq: Once | INTRAMUSCULAR | Status: AC
  Administered 2011-08-26: 40 mg via INTRAVENOUS
  Filled 2011-08-26: qty 4

## 2011-08-26 MED ORDER — LACTATED RINGERS IV BOLUS (SEPSIS): 1000.0000 mL | Freq: Three times a day (TID) | INTRAVENOUS | Status: AC | PRN

## 2011-08-26 MED ORDER — METOPROLOL TARTRATE 1 MG/ML IV SOLN
5.0000 mg | Freq: Four times a day (QID) | INTRAVENOUS | Status: DC | PRN
  Filled 2011-08-26: qty 5

## 2011-08-26 NOTE — Progress Notes (Signed)
CEBASTIAN NEIS [id number] 1945-11-11  CARE TEAM:  PCP: Marga Melnick, MD, MD  Outpatient Care Team: Patient Care Team: Pecola Lawless, MD as PCP - General  Inpatient Treatment Team: Treatment Team: Attending Provider: Md Montez Morita, MD; Registered Nurse: Macon Large, RN; Technician: Colleen Can, NT; Registered Nurse: Sydell Axon, RN; Registered Nurse: Skipper Cliche, RN; Registered Nurse: Patsi Sears, RN; Technician: Joellyn Haff, NT; Registered Nurse: Anda Kraft, RN; Registered Nurse: Kermit Balo Robbs, RN; Technician: Mal Misty, NT; Technician: Burnard Bunting, [place]; Registered Nurse: Horton Marshall; Registered Nurse: Rometta Emery, RN  Subjective:  Doing OK Walking in hallways No N/V.  Feels "rumblin's" in abdomen  Objective:  Vital signs:  Filed Vitals:   08/25/11 1711 08/25/11 2151 08/26/11 0300 08/26/11 0610  BP: 171/85 156/86 171/90 168/90  Pulse: 83 97 80 86  Temp:  99.8 F (37.7 C) 97.9 F (36.6 C) 98.8 F (37.1 C)  TempSrc:  Oral Oral Oral  Resp:  18 18 18   Height:      Weight:      SpO2:  94% 97% 96%    Last BM Date: 08/20/11  Intake/Output   Yesterday:  06/07 0701 - 06/08 0700 In: 4241.7 [I.V.:4241.7] Out: 2650 [Urine:2650] This shift:     Bowel function:  Flatus: n  BM: n  Physical Exam:  General: Pt awake/alert/oriented x4 in no acute distress Eyes: PERRL, normal EOM.  Sclera clear.  No icterus Neuro: CN II-XII intact w/o focal sensory/motor deficits. Lymph: No head/neck/groin lymphadenopathy Psych:  No delerium/psychosis/paranoia HENT: Normocephalic, Mucus membranes moist.  No thrush Neck: Supple, No tracheal deviation Chest: No chest wall pain w good excursion CV:  Pulses intact.  Regular rhythm Abdomen: Soft.  Mod distended.  Mildly tender at incisions only.  Incision with old blood.  No cellulitis.  No incarcerated hernias. Ext:  SCDs BLE.  No mjr edema.  No cyanosis Skin: No  petechiae / purpurae  Results:   Labs: Results for orders placed during the hospital encounter of 08/20/11 (from the past 48 hour(s))  GLUCOSE, CAPILLARY     Status: Normal   Collection Time   08/24/11 11:43 AM      Component Value Range Comment   Glucose-Capillary 98  70 - 99 (mg/dL)   GLUCOSE, CAPILLARY     Status: Normal   Collection Time   08/24/11  4:04 PM      Component Value Range Comment   Glucose-Capillary 93  70 - 99 (mg/dL)   GLUCOSE, CAPILLARY     Status: Abnormal   Collection Time   08/24/11  8:10 PM      Component Value Range Comment   Glucose-Capillary 102 (*) 70 - 99 (mg/dL)    Comment 1 Notify RN     GLUCOSE, CAPILLARY     Status: Normal   Collection Time   08/24/11 11:57 PM      Component Value Range Comment   Glucose-Capillary 85  70 - 99 (mg/dL)   GLUCOSE, CAPILLARY     Status: Normal   Collection Time   08/25/11  3:51 AM      Component Value Range Comment   Glucose-Capillary 90  70 - 99 (mg/dL)   GLUCOSE, CAPILLARY     Status: Normal   Collection Time   08/25/11  8:01 AM      Component Value Range Comment   Glucose-Capillary 84  70 - 99 (mg/dL)   GLUCOSE, CAPILLARY  Status: Abnormal   Collection Time   08/25/11  1:48 PM      Component Value Range Comment   Glucose-Capillary 64 (*) 70 - 99 (mg/dL)   GLUCOSE, CAPILLARY     Status: Abnormal   Collection Time   08/25/11  2:30 PM      Component Value Range Comment   Glucose-Capillary 119 (*) 70 - 99 (mg/dL)   GLUCOSE, CAPILLARY     Status: Normal   Collection Time   08/25/11  6:19 PM      Component Value Range Comment   Glucose-Capillary 84  70 - 99 (mg/dL)   GLUCOSE, CAPILLARY     Status: Normal   Collection Time   08/25/11  8:07 PM      Component Value Range Comment   Glucose-Capillary 78  70 - 99 (mg/dL)   GLUCOSE, CAPILLARY     Status: Normal   Collection Time   08/26/11 12:00 AM      Component Value Range Comment   Glucose-Capillary 82  70 - 99 (mg/dL)   GLUCOSE, CAPILLARY     Status: Normal    Collection Time   08/26/11  3:59 AM      Component Value Range Comment   Glucose-Capillary 81  70 - 99 (mg/dL)   GLUCOSE, CAPILLARY     Status: Normal   Collection Time   08/26/11  8:01 AM      Component Value Range Comment   Glucose-Capillary 87  70 - 99 (mg/dL)     Imaging / Studies: No results found.  Medications / Allergies: per chart  Antibiotics: Anti-infectives     Start     Dose/Rate Route Frequency Ordered Stop   08/23/11 1400   piperacillin-tazobactam (ZOSYN) IVPB 3.375 g  Status:  Discontinued        3.375 g 12.5 mL/hr over 240 Minutes Intravenous 3 times per day 08/23/11 1253 08/23/11 1830          Problem List:  Principal Problem:  *Small bowel obstruction Active Problems:  DIABETES MELLITUS, TYPE II  HYPERTENSION, ESSENTIAL NOS  POSTURAL HYPOTENSION, HX OF   Assessment  Donald Carlson  66 y.o. male  3 Days Post-Op  Procedure(s): EXPLORATORY LAPAROTOMY  Waiting for ileus to resolve  Plan: -wean IVF -Try diuretic x1 dose -keep NGT clamped for now.  If + flatus, D/C & try PO -BP controlled for now -DM - glc OK -VTE prophylaxis- SCDs, etc -mobilize as tolerated to help recovery  Ardeth Sportsman, M.D., F.A.C.S. Gastrointestinal and Minimally Invasive Surgery [facility] [address] [place], [place] [address] [phone] Main / Paging [phone] Voice Mail   08/26/2011

## 2011-08-27 LAB — GLUCOSE, CAPILLARY
Glucose-Capillary: 103 mg/dL — ABNORMAL HIGH (ref 70–99)
Glucose-Capillary: 107 mg/dL — ABNORMAL HIGH (ref 70–99)
Glucose-Capillary: 84 mg/dL (ref 70–99)
Glucose-Capillary: 86 mg/dL (ref 70–99)
Glucose-Capillary: 87 mg/dL (ref 70–99)
Glucose-Capillary: 94 mg/dL (ref 70–99)

## 2011-08-27 NOTE — Progress Notes (Signed)
Patient ID: LORIN GAWRON, male   DOB: 1945-07-25, 66 y.o.   MRN: [id number] 4 Days Post-Op  Subjective: No C/O.  NG has been clamped for 2 days without nausea.  No flatus or BM yet  Objective: Vital signs in last 24 hours: Temp:  [97.8 F (36.6 C)-98.6 F (37 C)] 97.9 F (36.6 C) (06/09 0355) Pulse Rate:  [78-101] 78  (06/09 0355) Resp:  [16-18] 18  (06/09 0355) BP: (126-171)/(78-100) 168/90 mmHg (06/09 0836) SpO2:  [95 %-97 %] 95 % (06/09 0355) Last BM Date: 08/20/11  Intake/Output from previous day: 06/08 0701 - 06/09 0700 In: 1903.8 [I.V.:1903.8] Out: 2800 [Urine:2800] Intake/Output this shift: Total I/O In: 200 [I.V.:200] Out: -   General appearance: alert and no distress GI: normal findings: bowel sounds normal and soft, non-tender  Lab Results:  No results found for this basename: WBC:2,HGB:2,HCT:2,PLT:2 in the last 72 hours BMET No results found for this basename: NA:2,K:2,CL:2,CO2:2,GLUCOSE:2,BUN:2,CREATININE:2,CALCIUM:2 in the last 72 hours   Studies/Results: No results found.  Anti-infectives: Anti-infectives     Start     Dose/Rate Route Frequency Ordered Stop   08/23/11 1400   piperacillin-tazobactam (ZOSYN) IVPB 3.375 g  Status:  Discontinued        3.375 g 12.5 mL/hr over 240 Minutes Intravenous 3 times per day 08/23/11 1253 08/23/11 1830          Assessment/Plan: s/p Procedure(s): EXPLORATORY LAPAROTOMY Stable  Will D/C NGT.  NPO x ice and sips. May shower   LOS: 7 days    Lavenia Stumpo T 08/27/2011

## 2011-08-28 LAB — COMPREHENSIVE METABOLIC PANEL
ALT: 25 U/L (ref 0–53)
AST: 23 U/L (ref 0–37)
Albumin: 2.6 g/dL — ABNORMAL LOW (ref 3.5–5.2)
Alkaline Phosphatase: 57 U/L (ref 39–117)
BUN: 19 mg/dL (ref 6–23)
CO2: 23 mEq/L (ref 19–32)
Calcium: 9.2 mg/dL (ref 8.4–10.5)
Chloride: 105 mEq/L (ref 96–112)
Creatinine, Ser: 0.94 mg/dL (ref 0.50–1.35)
GFR calc Af Amer: >90 mL/min (ref >90)
GFR calc non Af Amer: 85 mL/min — ABNORMAL LOW (ref >90)
Glucose, Bld: 90 mg/dL (ref 70–99)
Potassium: 4.2 mEq/L (ref 3.5–5.1)
Sodium: 139 mEq/L (ref 135–145)
Total Bilirubin: 0.3 mg/dL (ref 0.3–1.2)
Total Protein: 6.1 g/dL (ref 6.0–8.3)

## 2011-08-28 LAB — CBC
HCT: 35.3 % — ABNORMAL LOW (ref 39.0–52.0)
Hemoglobin: 11.7 g/dL — ABNORMAL LOW (ref 13.0–17.0)
MCH: 28.3 pg (ref 26.0–34.0)
MCHC: 33.1 g/dL (ref 30.0–36.0)
MCV: 85.5 fL (ref 78.0–100.0)
Platelets: 236 10*3/uL (ref 150–400)
RBC: 4.13 MIL/uL — ABNORMAL LOW (ref 4.22–5.81)
RDW: 13.3 % (ref 11.5–15.5)
WBC: 5.8 10*3/uL (ref 4.0–10.5)

## 2011-08-28 LAB — GLUCOSE, CAPILLARY
Glucose-Capillary: 73 mg/dL (ref 70–99)
Glucose-Capillary: 81 mg/dL (ref 70–99)
Glucose-Capillary: 92 mg/dL (ref 70–99)
Glucose-Capillary: 97 mg/dL (ref 70–99)
Glucose-Capillary: 110 mg/dL — ABNORMAL HIGH (ref 70–99)

## 2011-08-28 NOTE — Progress Notes (Signed)
General Surgery [facility] - Attending  Patient seen and examined.  Passing flatus.  Active BS on exam.  Will begin CL diet.  Ambulating.  Velora Heckler, MD, [facility] Office: [phone]

## 2011-08-28 NOTE — Progress Notes (Signed)
5 Days Post-Op  Subjective: Still a little distended, some flatus, not BM  Objective: Vital signs in last 24 hours: Temp:  [98 F (36.7 C)-98.8 F (37.1 C)] 98 F (36.7 C) (06/10 0416) Pulse Rate:  [65-74] 65  (06/10 0416) Resp:  [18-20] 18  (06/10 0416) BP: (142-145)/(77-81) 143/81 mmHg (06/10 0416) SpO2:  [96 %-97 %] 97 % (06/10 0416) Last BM Date: 08/20/11   no BM recorded, VSS, no labs,  Intake/Output from previous day: 06/09 0701 - 06/10 0700 In: 801 [I.V.:800] Out: 750 [Urine:750] Intake/Output this shift:    General appearance: alert, cooperative and no distress Resp: clear to auscultation bilaterally and few rales in bases. GI: distended, he thinks it's a little better, some flatus, not BM.  No nausea with full liquids. Incision is a little red.  Lab Results:  No results found for this basename: WBC:2,HGB:2,HCT:2,PLT:2 in the last 72 hours  BMET No results found for this basename: NA:2,K:2,CL:2,CO2:2,GLUCOSE:2,BUN:2,CREATININE:2,CALCIUM:2 in the last 72 hours PT/INR No results found for this basename: LABPROT:2,INR:2 in the last 72 hours  No results found for this basename: AST:5,ALT:5,ALKPHOS:5,BILITOT:5,PROT:5,ALBUMIN:5 in the last 168 hours   Lipase     Component Value Date/Time   LIPASE 25 08/20/2011 1630     Studies/Results: No results found.  Medications:    . antiseptic oral rinse  15 mL Mouth Rinse q12n4p  . aspirin EC  81 mg Oral Daily  . betaxolol  1 drop Both Eyes BID  . carvedilol  12.5 mg Oral BID WC  . chlorhexidine  15 mL Mouth Rinse BID  . enoxaparin  40 mg Subcutaneous Q24H  . insulin aspart  0-15 Units Subcutaneous Q4H  . 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  EXPLORATORY LAPAROTOMY and lysis of adhesions for small bowel obstruction 08/23/11 DR. Hoxworth.  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  Anemia  BMI 30.6  lovenox for DVT   Plan:  Check labs,   He does report going with BM at home for a couple days at time, but no real constipation from his standpoint. POD 5     LOS: 8 days    Donald Carlson 08/28/2011

## 2011-08-28 NOTE — Progress Notes (Signed)
INITIAL ADULT NUTRITION ASSESSMENT Date: 08/28/2011   Time: 9:53 AM Reason for Assessment: NPO x 8 days  ASSESSMENT: Male 66 y.o.  Dx: Small bowel obstruction  Food/Nutrition Related Hx: Pt admitted with sharp abdominal pain for 2 days PTA with worsening abdominal distention over the past month. Pt found to have SBO and NGT placed on 6/2. Pt had exploratory laparotomy with lysis of adhesions on 6/5. NGT d/c on 6/9. Pt walking halls today. Met with pt who reports PTA he was eating great, 3 meals/day, no changes in weight or appetite. Pt reports passing gas this morning and having some "rumblings" in stomach. Pt denies any nausea. Abdomen still distended per RN. If diet unable to be advanced, recommend TNA as pt has been NPO for the past 8 days.   Hx:  Past Medical History  Diagnosis Date  . Kidney stones   . Hypertension   . Diabetes mellitus   . Anemia   . Arthritis   . Skin cancer (melanoma) 1996    Left/ Back  . Bladder cancer 2012    scrapped bladder and inserted liquid chemo  . HERNIORRHAPHY, HX OF 01/07/2007    Qualifier: Diagnosis of  By: Yetta Barre CMA, Chemira     Related Meds:  Scheduled Meds:   . antiseptic oral rinse  15 mL Mouth Rinse q12n4p  . aspirin EC  81 mg Oral Daily  . betaxolol  1 drop Both Eyes BID  . carvedilol  12.5 mg Oral BID WC  . chlorhexidine  15 mL Mouth Rinse BID  . enoxaparin  40 mg Subcutaneous Q24H  . insulin aspart  0-15 Units Subcutaneous Q4H  . pantoprazole (PROTONIX) IV  40 mg Intravenous QHS   Continuous Infusions:   . 0.9 % NaCl with KCl 20 mEq / L 50 mL/hr at 08/28/11 0037   PRN Meds:.hydrALAZINE, HYDROmorphone (DILAUDID) injection, lactated ringers, metoprolol, ondansetron  Ht: 5\' 10"  (177.8 cm)  Wt: 213 lb (96.616 kg)  Ideal Wt: 166 lb % Ideal Wt: 128  Usual Wt: 212-214 lb % Usual Wt: 100  Body mass index is 30.56 kg/(m^2). Class I obesity   Labs:  CMP     Component Value Date/Time   NA 139 08/28/2011 0959   K 4.2  08/28/2011 0959   CL 105 08/28/2011 0959   CO2 23 08/28/2011 0959   GLUCOSE 90 08/28/2011 0959   GLUCOSE 107* 03/08/2006 1027   BUN 19 08/28/2011 0959   CREATININE 0.94 08/28/2011 0959   CALCIUM 9.2 08/28/2011 0959   PROT 6.1 08/28/2011 0959   ALBUMIN 2.6* 08/28/2011 0959   AST 23 08/28/2011 0959   ALT 25 08/28/2011 0959   ALKPHOS 57 08/28/2011 0959   BILITOT 0.3 08/28/2011 0959   GFRNONAA 85* 08/28/2011 0959   GFRAA >90 08/28/2011 0959    Intake/Output Summary (Last 24 hours) at 08/28/11 1217 Last data filed at 08/28/11 1000  Gross per 24 hour  Intake    601 ml  Output   1050 ml  Net   -449 ml   Last BM - 08/20/11  Diet Order: NPO   IVF:    0.9 % NaCl with KCl 20 mEq / L Last Rate: 50 mL/hr at 08/28/11 0037    Estimated Nutritional Needs:   Kcal: 1610-9604 Protein: 90-110g Fluid: 1.9-2.2L  NUTRITION DIAGNOSIS: -Inadequate oral intake (NI-2.1).  Status: Ongoing  RELATED TO: SBO  AS EVIDENCE BY: MD notes  MONITORING/EVALUATION(Goals): Advance diet as tolerated to regular diet  EDUCATION NEEDS: -No  education needs identified at this time  INTERVENTION: Diet advancement per MD. If diet unable to be advanced, recommend TNA. Will monitor.   Dietitian #: [phone]  DOCUMENTATION CODES Per approved criteria  -Obesity Unspecified    Marshall Cork 08/28/2011, 9:53 AM

## 2011-08-29 LAB — GLUCOSE, CAPILLARY
Glucose-Capillary: 107 mg/dL — ABNORMAL HIGH (ref 70–99)
Glucose-Capillary: 129 mg/dL — ABNORMAL HIGH (ref 70–99)
Glucose-Capillary: 133 mg/dL — ABNORMAL HIGH (ref 70–99)
Glucose-Capillary: 68 mg/dL — ABNORMAL LOW (ref 70–99)
Glucose-Capillary: 71 mg/dL (ref 70–99)
Glucose-Capillary: 97 mg/dL (ref 70–99)
Glucose-Capillary: 97 mg/dL (ref 70–99)

## 2011-08-29 MED ORDER — HYDROCODONE-ACETAMINOPHEN 5-325 MG PO TABS
1.0000 | ORAL_TABLET | ORAL | Status: DC | PRN
  Administered 2011-08-30 – 2011-08-31 (×3): 2 via ORAL
  Filled 2011-08-29 (×3): qty 2

## 2011-08-29 NOTE — Progress Notes (Signed)
6 Days Post-Op  Subjective: Doesn't hurt till he coughs.  +Flatus, taking some clears and OK  Objective: Vital signs in last 24 hours: Temp:  [97.7 F (36.5 C)-98.4 F (36.9 C)] 98.1 F (36.7 C) (06/11 0525) Pulse Rate:  [63-70] 66  (06/11 0525) Resp:  [18] 18  (06/11 0525) BP: (151-167)/(79-93) 151/79 mmHg (06/11 0525) SpO2:  [96 %-98 %] 96 % (06/11 0525) Last BM Date: 08/20/11  200 po yesterday, afebrile, VSS, no labs,  Intake/Output from previous day: 06/10 0701 - 06/11 0700 In: 1200 [P.O.:200; I.V.:1000] Out: 1075 [Urine:1075] Intake/Output this shift: Total I/O In: -  Out: 400 [Urine:400]  General appearance: alert, cooperative and no distress Resp: clear to auscultation bilaterally and few rales in bases GI: slightly distended, +BS,+flatus, wound a little red.  Lab Results:   [facility] 08/28/11 0959  WBC 5.8  HGB 11.7*  HCT 35.3*  PLT 236    BMET  Basename 08/28/11 0959  NA 139  K 4.2  CL 105  CO2 23  GLUCOSE 90  BUN 19  CREATININE 0.94  CALCIUM 9.2   PT/INR No results found for this basename: LABPROT:2,INR:2 in the last 72 hours   Lab 08/28/11 0959  AST 23  ALT 25  ALKPHOS 57  BILITOT 0.3  PROT 6.1  ALBUMIN 2.6*     Lipase     Component Value Date/Time   LIPASE 25 08/20/2011 1630     Studies/Results: No results found.  Medications:    . antiseptic oral rinse  15 mL Mouth Rinse q12n4p  . aspirin EC  81 mg Oral Daily  . betaxolol  1 drop Both Eyes BID  . carvedilol  12.5 mg Oral BID WC  . chlorhexidine  15 mL Mouth Rinse BID  . enoxaparin  40 mg Subcutaneous Q24H  . insulin aspart  0-15 Units Subcutaneous Q4H  . 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   EXPLORATORY LAPAROTOMY and lysis of adhesions for small bowel obstruction 08/23/11 DR. Hoxworth.  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  Anemia  BMI 30.6  lovenox for DVT   Plan:  Continue clears, continue  To mobilize, labs tomorrow     LOS: 9 days    JENNINGS,WILLARD 08/29/2011   Had BM.  No nausea.  Ready for some regular food. Wound with some superficial redness. To advance diet to regular. Need to check wound again tomorrow.  Ovidio Kin, MD, [facility] Surgery Pager: [phone] Office phone:  [phone]

## 2011-08-29 NOTE — Progress Notes (Signed)
Patient blood glucose this am was 68.  For precautionary measures, patient was given apple juice.  This was explained to patient.

## 2011-08-29 NOTE — Progress Notes (Signed)
UR complete 

## 2011-08-30 ENCOUNTER — Inpatient Hospital Stay (HOSPITAL_COMMUNITY): Payer: Medicare Other

## 2011-08-30 LAB — COMPREHENSIVE METABOLIC PANEL
ALT: 26 U/L (ref 0–53)
AST: 24 U/L (ref 0–37)
Albumin: 2.7 g/dL — ABNORMAL LOW (ref 3.5–5.2)
Alkaline Phosphatase: 57 U/L (ref 39–117)
BUN: 11 mg/dL (ref 6–23)
CO2: 28 mEq/L (ref 19–32)
Calcium: 8.7 mg/dL (ref 8.4–10.5)
Chloride: 104 mEq/L (ref 96–112)
Creatinine, Ser: 1.04 mg/dL (ref 0.50–1.35)
GFR calc Af Amer: 84 mL/min — ABNORMAL LOW (ref >90)
GFR calc non Af Amer: 73 mL/min — ABNORMAL LOW (ref >90)
Glucose, Bld: 90 mg/dL (ref 70–99)
Potassium: 3.5 mEq/L (ref 3.5–5.1)
Sodium: 139 mEq/L (ref 135–145)
Total Bilirubin: 0.3 mg/dL (ref 0.3–1.2)
Total Protein: 5.9 g/dL — ABNORMAL LOW (ref 6.0–8.3)

## 2011-08-30 LAB — CBC
HCT: 32.9 % — ABNORMAL LOW (ref 39.0–52.0)
Hemoglobin: 10.9 g/dL — ABNORMAL LOW (ref 13.0–17.0)
MCH: 27.9 pg (ref 26.0–34.0)
MCHC: 33.1 g/dL (ref 30.0–36.0)
MCV: 84.4 fL (ref 78.0–100.0)
Platelets: 254 10*3/uL (ref 150–400)
RBC: 3.9 MIL/uL — ABNORMAL LOW (ref 4.22–5.81)
RDW: 13 % (ref 11.5–15.5)
WBC: 7.1 10*3/uL (ref 4.0–10.5)

## 2011-08-30 LAB — GLUCOSE, CAPILLARY
Glucose-Capillary: 95 mg/dL (ref 70–99)
Glucose-Capillary: 129 mg/dL — ABNORMAL HIGH (ref 70–99)
Glucose-Capillary: 180 mg/dL — ABNORMAL HIGH (ref 70–99)
Glucose-Capillary: 100 mg/dL — ABNORMAL HIGH (ref 70–99)
Glucose-Capillary: 94 mg/dL (ref 70–99)

## 2011-08-30 LAB — URIC ACID: Uric Acid, Serum: 6.8 mg/dL (ref 4.0–7.8)

## 2011-08-30 MED ORDER — FLEET ENEMA 7-19 GM/118ML RE ENEM
1.0000 | ENEMA | Freq: Once | RECTAL | Status: AC
  Administered 2011-08-30: 1 via RECTAL
  Filled 2011-08-30: qty 1

## 2011-08-30 MED ORDER — COLCHICINE 0.6 MG PO TABS
0.6000 mg | ORAL_TABLET | Freq: Three times a day (TID) | ORAL | Status: DC
  Administered 2011-08-30 – 2011-09-01 (×7): 0.6 mg via ORAL
  Filled 2011-08-30 (×9): qty 1

## 2011-08-30 MED ORDER — PANTOPRAZOLE SODIUM 40 MG PO TBEC
40.0000 mg | DELAYED_RELEASE_TABLET | Freq: Every day | ORAL | Status: DC
  Administered 2011-08-30 – 2011-08-31 (×2): 40 mg via ORAL
  Filled 2011-08-30 (×3): qty 1

## 2011-08-30 NOTE — Progress Notes (Signed)
Patient sitting up.  No complaints.  No flatus or BM.  ABDOMEN - 2 VIEW  Comparison: 08/23/2011.  Findings: Slow distal progression of contrast. Stool is present in  the ascending colon. Midline surgical staples are present.  Contrast is present in the predominately decompressed descending  colon. Small amount of rectal gas is present. There is a  Normalizing small bowel gas pattern with a few gas-distended loops  in the left central abdomen.  IMPRESSION:  Slow distal progression of contrast, compatible with postoperative  ileus. Stool and bowel gas extends to the level of the  rectosigmoid. There has been distal progression of contrast  through the descending and sigmoid colon.  Original Report Authenticated By: Andreas Newport, M.D.\  Incision - mild erythema at upper end of incision; no drainage.  Imp:  Post-op ileus after exp lap/ LOA  Small bowel gas pattern improved - contrast is in colon.  Enema today. Watch incision.  Wilmon Arms. Corliss Skains, MD, [facility] Surgery  08/30/2011 3:01 PM

## 2011-08-30 NOTE — Progress Notes (Signed)
Pharmacy: IV to PO Protonix  Patient has been receiving IV Protonix. Per Pharmacy and Therapeutics Committee, this patient meets criteria for auto conversion to PO Protonix:   Tolerating a diet of full liquids or better  Tolerating other medications via enteral route  No GIB  Lilyann Gravelle PharmD  [phone] 04/17/2011 8:54 AM    

## 2011-08-30 NOTE — Progress Notes (Signed)
7 Days Post-Op  Subjective: Multiple complaints, pain with dinner last PM and this AM, no nausea, pain is in stomach, pointing to abdominal area of incision,  Burning sensation to left nipple with certain movements, and gout Left great toe.  Less flatus, and only the one small BM yesterday.  Objective: Vital signs in last 24 hours: Temp:  [97.8 F (36.6 C)-98.7 F (37.1 C)] 98.7 F (37.1 C) (06/12 0600) Pulse Rate:  [66-68] 66  (06/12 0600) Resp:  [18-20] 20  (06/12 0600) BP: (157-173)/(83-86) 162/83 mmHg (06/12 0600) SpO2:  [97 %-98 %] 98 % (06/12 0600) Last BM Date: 08/20/11  300 ml PO, afebrile, BP up some, labs OK, diet regular  Intake/Output from previous day: 06/11 0701 - 06/12 0700 In: 1450 [P.O.:300; I.V.:1150] Out: 3350 [Urine:3350] Intake/Output this shift:    General appearance: alert, cooperative and no distress Resp: Rales both bases, Right more than left. GI: mildly distended, still tender, incision is still red, few BS, +flatus, but he says less than before, and one small BM yesterday AM, notthing since. Extremities: No edema, pain and some swelling 1st Mp joint on left.  Lab Results:   [facility] 08/30/11 0433 08/28/11 0959  WBC 7.1 5.8  HGB 10.9* 11.7*  HCT 32.9* 35.3*  PLT 254 236    BMET  Basename 08/30/11 0433 08/28/11 0959  NA 139 139  K 3.5 4.2  CL 104 105  CO2 28 23  GLUCOSE 90 90  BUN 11 19  CREATININE 1.04 0.94  CALCIUM 8.7 9.2   PT/INR No results found for this basename: LABPROT:2,INR:2 in the last 72 hours   Lab 08/30/11 0433 08/28/11 0959  AST 24 23  ALT 26 25  ALKPHOS 57 57  BILITOT 0.3 0.3  PROT 5.9* 6.1  ALBUMIN 2.7* 2.6*     Lipase     Component Value Date/Time   LIPASE 25 08/20/2011 1630     Studies/Results: No results found.  Medications:    . antiseptic oral rinse  15 mL Mouth Rinse q12n4p  . aspirin EC  81 mg Oral Daily  . betaxolol  1 drop Both Eyes BID  . carvedilol  12.5 mg Oral BID WC  . chlorhexidine   15 mL Mouth Rinse BID  . enoxaparin  40 mg Subcutaneous Q24H  . insulin aspart  0-15 Units Subcutaneous Q4H  . pantoprazole  40 mg Oral Q1200  . 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  EXPLORATORY LAPAROTOMY and lysis of adhesions for small bowel obstruction 08/23/11 DR. Hoxworth.  Hernia repair  Lumbar laminectomy  Shoulder surgery  Hypertension ON BB/ACE at home  Dyslipidemia  BPH  Nephrolithiasis 10/2010  Gout  Renal Insuffiency 10/2010     Plan:  Slow down on  PO, check a film, start rx for his gout, check uric acid. Encourage IS.     LOS: 10 days    Donald Carlson 08/30/2011

## 2011-08-31 LAB — GLUCOSE, CAPILLARY
Glucose-Capillary: 127 mg/dL — ABNORMAL HIGH (ref 70–99)
Glucose-Capillary: 136 mg/dL — ABNORMAL HIGH (ref 70–99)
Glucose-Capillary: 74 mg/dL (ref 70–99)
Glucose-Capillary: 85 mg/dL (ref 70–99)
Glucose-Capillary: 85 mg/dL (ref 70–99)

## 2011-08-31 MED ORDER — SODIUM CHLORIDE 0.9 % IJ SOLN: 3.0000 mL | INTRAMUSCULAR | Status: DC | PRN

## 2011-08-31 MED ORDER — ENSURE COMPLETE PO LIQD
237.0000 mL | Freq: Two times a day (BID) | ORAL | Status: DC
  Administered 2011-08-31 – 2011-09-01 (×2): 237 mL via ORAL

## 2011-08-31 MED ORDER — LACTATED RINGERS IV BOLUS (SEPSIS): 1000.0000 mL | Freq: Three times a day (TID) | INTRAVENOUS | Status: DC | PRN

## 2011-08-31 MED ORDER — PSYLLIUM 95 % PO PACK
1.0000 | PACK | Freq: Two times a day (BID) | ORAL | Status: DC
  Administered 2011-08-31: 1 via ORAL
  Filled 2011-08-31 (×3): qty 1

## 2011-08-31 MED ORDER — SODIUM CHLORIDE 0.9 % IJ SOLN
3.0000 mL | Freq: Two times a day (BID) | INTRAMUSCULAR | Status: DC
  Administered 2011-08-31: 3 mL via INTRAVENOUS

## 2011-08-31 MED ORDER — SACCHAROMYCES BOULARDII 250 MG PO CAPS
250.0000 mg | ORAL_CAPSULE | Freq: Two times a day (BID) | ORAL | Status: DC
  Administered 2011-08-31 – 2011-09-01 (×2): 250 mg via ORAL
  Filled 2011-08-31 (×3): qty 1

## 2011-08-31 NOTE — Progress Notes (Signed)
8 Days Post-Op  Subjective: Had some results from enema, but not much.  Still distended, has had some full liquids, but not allot, felt a little nauseated this AM, so has not eaten breakfast  Objective: Vital signs in last 24 hours: Temp:  [98.4 F (36.9 C)-98.7 F (37.1 C)] 98.4 F (36.9 C) (06/13 0623) Pulse Rate:  [61-72] 67  (06/13 0623) Resp:  [19-20] 20  (06/13 0623) BP: (148-165)/(83-90) 148/83 mmHg (06/13 0623) SpO2:  [96 %-99 %] 96 % (06/13 0623) Last BM Date: 2011-09-09  +BM, afebrile, VSS, no labs,   Intake/Output from previous day: 09-Sep-2022 0701 - 06/13 0700 In: 2061 [P.O.:1060; I.V.:1000] Out: 2351 [Urine:2350; Stool:1] Intake/Output this shift:    General appearance: alert, cooperative and no distress Resp: clear to auscultation bilaterally GI: distended, +BS, + flatus, wound  Lab Results:   [facility] 09-09-11 0433 08/28/11 0959  WBC 7.1 5.8  HGB 10.9* 11.7*  HCT 32.9* 35.3*  PLT 254 236    BMET  Basename 09/09/11 0433 08/28/11 0959  NA 139 139  K 3.5 4.2  CL 104 105  CO2 28 23  GLUCOSE 90 90  BUN 11 19  CREATININE 1.04 0.94  CALCIUM 8.7 9.2   PT/INR No results found for this basename: LABPROT:2,INR:2 in the last 72 hours   Lab Sep 09, 2011 0433 08/28/11 0959  AST 24 23  ALT 26 25  ALKPHOS 57 57  BILITOT 0.3 0.3  PROT 5.9* 6.1  ALBUMIN 2.7* 2.6*     Lipase     Component Value Date/Time   LIPASE 25 08/20/2011 1630     Studies/Results: Dg Abd 2 Views  09/09/2011  *RADIOLOGY REPORT*  Clinical Data: Postoperative ileus.  ABDOMEN - 2 VIEW  Comparison: 08/23/2011.  Findings: Slow distal progression of contrast.  Stool is present in the ascending colon.  Midline surgical staples are present. Contrast is present in the predominately decompressed descending colon.  Small amount of rectal gas is present.  There is a Normalizing small bowel gas pattern with a few gas-distended loops in the left central abdomen.  IMPRESSION: Slow distal progression of  contrast, compatible with postoperative ileus.  Stool and bowel gas extends to the level of the rectosigmoid.  There has been distal progression of contrast through the descending and sigmoid colon.  Original Report Authenticated By: Andreas Newport, M.D.    Medications:    . antiseptic oral rinse  15 mL Mouth Rinse q12n4p  . aspirin EC  81 mg Oral Daily  . betaxolol  1 drop Both Eyes BID  . carvedilol  12.5 mg Oral BID WC  . chlorhexidine  15 mL Mouth Rinse BID  . colchicine  0.6 mg Oral TID  . enoxaparin  40 mg Subcutaneous Q24H  . feeding supplement  237 mL Oral BID BM  . insulin aspart  0-15 Units Subcutaneous Q4H  . pantoprazole  40 mg Oral Q1200  . 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  EXPLORATORY LAPAROTOMY and lysis of adhesions for small bowel obstruction 08/23/11 DR. Hoxworth.  Hernia repair  Lumbar laminectomy  Shoulder surgery  Hypertension ON BB/ACE at home  Dyslipidemia  BPH  Nephrolithiasis 10/2010  Gout  Renal Insuffiency 10/2010   Plan:  He is walking, I will let him stay on fulls, and give him some more time.  LOS: 11 days  Donald Carlson 08/31/2011

## 2011-08-31 NOTE — Progress Notes (Signed)
Feels better ++flatus  General: Pt awake/alert/oriented x4 in no major acute distress Eyes: PERRL, normal EOM. Sclera nonicteric Neuro: CN II-XII intact w/o focal sensory/motor deficits. Lymph: No head/neck/groin lymphadenopathy Psych:  No delerium/psychosis/paranoia HENT: Normocephalic, Mucus membranes moist.  No thrush Neck: Supple, No tracheal deviation Chest: No pain.  Good respiratory excursion. CV:  Pulses intact.  Regular rhythm Abdomen: Soft/Obese.   Pt says nondistended.  Mildly tender at incision only.  Incision with mild erythema at staples only.  No incarcerated hernias. Ext:  SCDs BLE.  No significant edema.  No cyanosis Skin: No petechiae / purpurae   Not a major BM yet Tolerating fulls OK  -will d/c IVF -adv diet gradually -d/c staples

## 2011-08-31 NOTE — Progress Notes (Signed)
SINCE PT IS ON A DIET WHEN NEED ORDER TO CHANGE CBG'S TO ACHS.

## 2011-08-31 NOTE — Progress Notes (Signed)
Nutrition Brief Note  - Diet advanced to regular on 6/11. Pt c/o pain with eating in stomach area and abdominal area of incision. Pt now on full liquid diet. Observed that pt had only had a few bites of grits on breakfast tray - said he didn't like the taste. Pt states he feels like his appetite is good, he wants to eat, just knows that if he eats/drinks too quickly he will have the sharp stomach pains again. Pt does not think this pain is indigestion. Pt states he had a small BM on 6/12. DG of abdomen on 6/12 showed normalized gas pattern. Assisted pt in ordering meal and will order Ensure Complete for additional nutrition. Will continue to monitor.   Dietitian# [phone]

## 2011-08-31 NOTE — Progress Notes (Signed)
Staples removed,steris applied patient tolerated well. CLum Keas

## 2011-09-01 ENCOUNTER — Encounter (HOSPITAL_COMMUNITY): Payer: Self-pay | Admitting: Surgery

## 2011-09-01 LAB — GLUCOSE, CAPILLARY
Glucose-Capillary: 92 mg/dL (ref 70–99)
Glucose-Capillary: 85 mg/dL (ref 70–99)

## 2011-09-01 MED ORDER — HYDROCODONE-ACETAMINOPHEN 5-325 MG PO TABS: 1.0000 | ORAL_TABLET | Freq: Four times a day (QID) | ORAL | Status: AC | PRN

## 2011-09-01 NOTE — Discharge Instructions (Signed)

## 2011-09-01 NOTE — Discharge Summary (Signed)
Physician Discharge Summary  Patient ID: Donald Carlson MRN: [id number] DOB/AGE: 1946/01/15 66 y.o.  Patient Care Team: Pecola Lawless, MD as PCP - General  Admit date: 08/20/2011 Discharge date: 09/01/2011  Admission Diagnoses: Principal Problem:  *Small bowel obstruction Active Problems:  DIABETES MELLITUS, TYPE II  HYPERTENSION, ESSENTIAL NOS  POSTURAL HYPOTENSION, HX OF  Discharge Diagnoses:  Principal Problem:  *Small bowel obstruction s/p open lysis of adhesions Jun2013 Active Problems:  DIABETES MELLITUS, TYPE II  HYPERTENSION, ESSENTIAL NOS  POSTURAL HYPOTENSION, HX OF   Discharged Condition: good  Hospital Course: A pleasant gentleman who came in with a bowel obstruction. He was placed on bowel rest with a nasogastric tube. He failed to improve. He underwent open lysis of adhesions on hospital day #3. He was placed on bowel rest again with hydration. It took some time but gradually he developed flatus. NG tube was clamped. Eventually removed. Gradually advanced on his diet. He began to have a small bowel movement after an enema and then more regular bowel function thereafter.  By the time of discharge, he was walking well the hallways, tolerating solid food, glucose control was adequate. He was having flatus. He had a few bowel movements. His pain control was adequate on oral medications.  Therefore, we thought it be reasonable for him to be discharged home with close followup. Postoperative instructions and followup were discussed. They're written as well. He agrees with discharge on postoperative day #9  Consults: None  Significant Diagnostic Studies:   Treatments: surgery: open LOA for SBO 08/23/2011.  Dr Johna Sheriff  Discharge Exam: Blood pressure 150/79, pulse 61, temperature 98.1 F (36.7 C), temperature source Oral, resp. rate 18, height 5\' 10"  (1.778 m), weight 213 lb (96.616 kg), SpO2 96.00%.  General: Pt awake/alert/oriented x4 in no major acute  distress Eyes: PERRL, normal EOM. Sclera nonicteric Neuro: CN II-XII intact w/o focal sensory/motor deficits. Lymph: No head/neck/groin lymphadenopathy Psych:  No delerium/psychosis/paranoia HENT: Normocephalic, Mucus membranes moist.  No thrush Neck: Supple, No tracheal deviation Chest: No pain.  Good respiratory excursion. CV:  Pulses intact.  Regular rhythm Abdomen: Soft, Nondistended.  Min tender at incision.  Mild maceration but not cellulitis/drainage.  No incarcerated hernias. Ext:  SCDs BLE.  No significant edema.  No cyanosis Skin: No petechiae / purpurae   Disposition: 01-Home or Self Care  Discharge Orders    Future Orders Please Complete By Expires   Diet - low sodium heart healthy      Diet Carb Modified      Increase activity slowly        Medication List  As of 09/01/2011  9:20 AM   TAKE these medications         aspirin 81 MG tablet   Take 81 mg by mouth daily.      betaxolol 0.25 % ophthalmic suspension   Commonly known as: BETOPTIC-S   Place 1 drop into both eyes 2 (two) times daily.      carvedilol 25 MG tablet   Commonly known as: COREG   TAKE 1 TABLET BY MOUTH TWICE A DAY      Flax Seed Oil 1000 MG Caps   Take 1,000 mg by mouth once.      HYDROcodone-acetaminophen 5-325 MG per tablet   Commonly known as: NORCO   Take 1-2 tablets by mouth every 6 (six) hours as needed for pain.      lisinopril 20 MG tablet   Commonly known as: PRINIVIL,ZESTRIL   TAKE  2 TABLETS BY MOUTH EVERY DAY      NASCOBAL 500 MCG/0.1ML Soln   Generic drug: Cyanocobalamin   USE 1 SPRAY IN ONE NOSTRIL ONCE A WEEK      pravastatin 40 MG tablet   Commonly known as: PRAVACHOL   Take 1 tablet (40 mg total) by mouth daily.      saw palmetto 160 MG capsule   Take 160 mg by mouth 2 (two) times daily.      Vitamin D 1000 UNITS capsule   Take 1,000 Units by mouth daily.           Follow-up Information    Follow up with HOXWORTH,BENJAMIN T, MD. Schedule an appointment as  soon as possible for a visit in 2 weeks.   Contact information:   [employer] [address] [place] [address] [phone]          Signed: Ardeth Sportsman. 09/01/2011, 9:20 AM

## 2011-09-06 ENCOUNTER — Other Ambulatory Visit: Payer: Self-pay | Admitting: Internal Medicine

## 2011-09-11 ENCOUNTER — Telehealth (INDEPENDENT_AMBULATORY_CARE_PROVIDER_SITE_OTHER): Payer: Self-pay | Admitting: General Surgery

## 2011-09-11 NOTE — Telephone Encounter (Signed)
Pt's wife calling to report the top of husband's incision is leaking clear, odorless fluid.  Reassure wife that this is serous fluid and should be covered with gauze to absorb it.  No need for MD to inspect unless drainage is purulent, area looks red and infected or he develops a fever.  Wife understands and will comply.  Follow-up appt is set for 09/19/11.

## 2011-09-19 ENCOUNTER — Ambulatory Visit (INDEPENDENT_AMBULATORY_CARE_PROVIDER_SITE_OTHER): Payer: Medicare Other | Admitting: General Surgery

## 2011-09-19 ENCOUNTER — Encounter (INDEPENDENT_AMBULATORY_CARE_PROVIDER_SITE_OTHER): Payer: Self-pay | Admitting: General Surgery

## 2011-09-19 VITALS — BP 172/95 | HR 66 | Temp 97.4°F | Ht 70.0 in | Wt 205.2 lb

## 2011-09-19 DIAGNOSIS — Z09 Encounter for follow-up examination after completed treatment for conditions other than malignant neoplasm: Secondary | ICD-10-CM

## 2011-09-19 NOTE — Progress Notes (Signed)
History: Patient returns one month following laparotomy and lysis of adhesions for mechanical small bowel obstruction. He has been home for a couple of weeks. He has not had any further abdominal bloating or pain or vomiting. He has had occasional diarrhea when he took a laxative once but otherwise bowels are moving normally. He feels like he is making good progress.  Exam: General: Appears well Abdomen: Low midline incision well healed without complication. Abdomen is soft and nondistended and nontender.  Assessment and plan: Doing well following laparotomy lysis of adhesions without complication identified. I think he is able to return to work in about 2 weeks. He is doing well enough that he will return here as needed.

## 2011-11-21 ENCOUNTER — Telehealth: Payer: Self-pay | Admitting: *Deleted

## 2011-11-21 NOTE — Telephone Encounter (Signed)
Pt wife indicated that NASCOBAL gone up to  $380 and would like to know if there is a alternative that he can take.Marland KitchenPlease advise

## 2011-11-21 NOTE — Telephone Encounter (Signed)
I am very sorry; the only alternative is monthly injections. This is the frustrating factor I discussed with the pharmaceutical rep for Nascobal repeatedly when he has asked why I'm not Rxing a lot of his product. It would certainly be easier but the cost is prohibitive.

## 2011-11-21 NOTE — Telephone Encounter (Signed)
Left Pt detail message of Dr Alwyn Ren comment and to return call with any further cocerns.

## 2011-12-02 ENCOUNTER — Other Ambulatory Visit: Payer: Self-pay | Admitting: Internal Medicine

## 2011-12-02 DIAGNOSIS — T887XXA Unspecified adverse effect of drug or medicament, initial encounter: Secondary | ICD-10-CM

## 2011-12-02 DIAGNOSIS — E785 Hyperlipidemia, unspecified: Secondary | ICD-10-CM

## 2011-12-11 NOTE — Telephone Encounter (Signed)
Order placed

## 2011-12-11 NOTE — Telephone Encounter (Signed)
Pt called back and would like to have labs done at [facility] location.

## 2011-12-11 NOTE — Telephone Encounter (Signed)
What labs does this patient need. 

## 2011-12-11 NOTE — Addendum Note (Signed)
Addended by: Maurice Small on: 12/11/2011 10:56 AM   Modules accepted: Orders

## 2011-12-15 ENCOUNTER — Other Ambulatory Visit: Payer: Medicare Other

## 2011-12-15 ENCOUNTER — Other Ambulatory Visit (INDEPENDENT_AMBULATORY_CARE_PROVIDER_SITE_OTHER): Payer: Medicare Other

## 2011-12-15 DIAGNOSIS — E785 Hyperlipidemia, unspecified: Secondary | ICD-10-CM

## 2011-12-15 DIAGNOSIS — T887XXA Unspecified adverse effect of drug or medicament, initial encounter: Secondary | ICD-10-CM

## 2011-12-15 LAB — LIPID PANEL
Cholesterol: 170 mg/dL (ref 0–200)
HDL: 31.7 mg/dL — ABNORMAL LOW (ref >39.00)
LDL Cholesterol: 110 mg/dL — ABNORMAL HIGH (ref 0–99)
Total CHOL/HDL Ratio: 5
Triglycerides: 141 mg/dL (ref 0.0–149.0)
VLDL: 28.2 mg/dL (ref 0.0–40.0)

## 2011-12-15 LAB — HEPATIC FUNCTION PANEL
ALT: 33 U/L (ref 0–53)
AST: 24 U/L (ref 0–37)
Albumin: 3.6 g/dL (ref 3.5–5.2)
Alkaline Phosphatase: 63 U/L (ref 39–117)
Bilirubin, Direct: 0.1 mg/dL (ref 0.0–0.3)
Total Bilirubin: 0.7 mg/dL (ref 0.3–1.2)
Total Protein: 6.6 g/dL (ref 6.0–8.3)

## 2011-12-28 ENCOUNTER — Telehealth: Payer: Self-pay | Admitting: Internal Medicine

## 2011-12-28 DIAGNOSIS — R7309 Other abnormal glucose: Secondary | ICD-10-CM

## 2011-12-28 NOTE — Telephone Encounter (Signed)
Pt is due for labs and would like to have them done at [facility]. Can you place the order please? After order placed, I will call pt and let them know.

## 2011-12-28 NOTE — Telephone Encounter (Signed)
Order placed for recommended A1c

## 2011-12-29 ENCOUNTER — Other Ambulatory Visit: Payer: Medicare Other

## 2011-12-29 ENCOUNTER — Other Ambulatory Visit (INDEPENDENT_AMBULATORY_CARE_PROVIDER_SITE_OTHER): Payer: Medicare Other

## 2011-12-29 DIAGNOSIS — R7309 Other abnormal glucose: Secondary | ICD-10-CM

## 2011-12-29 LAB — HEMOGLOBIN A1C: Hgb A1c MFr Bld: 9.5 % — ABNORMAL HIGH (ref 4.6–6.5)

## 2012-01-09 ENCOUNTER — Other Ambulatory Visit: Payer: Self-pay | Admitting: Internal Medicine

## 2012-01-09 IMAGING — CT CT ABD-PELV W/O CM
1 of 2 series · 15 of 32 positions shown, 19 images · non-contrast
Comparison: 10/29/2010 [HOSPITAL].

CLINICAL DATA: Hematuria.  Right flank pain.  Prior appendectomy.

CT ABDOMEN AND PELVIS WITHOUT CONTRAST
TECHNIQUE: Multidetector CT imaging of the abdomen and pelvis was
performed following the standard protocol without intravenous
contrast.

[Series 2: abd/pel w/o · axial · non-contrast · 0.82mm/px · z∈[-450,-5]mm · 15 of 97 slices shown, 19 images]
[im 4/97  soft-tissue]
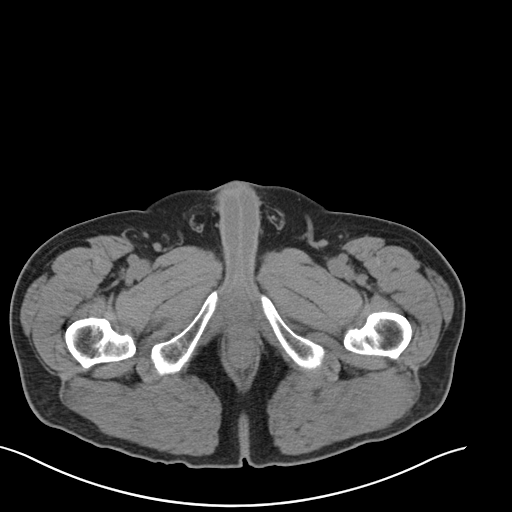
[im 4/97  bone]
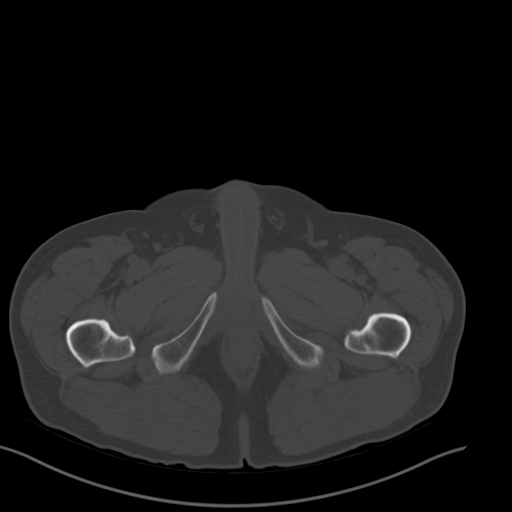
[im 12/97  soft-tissue]
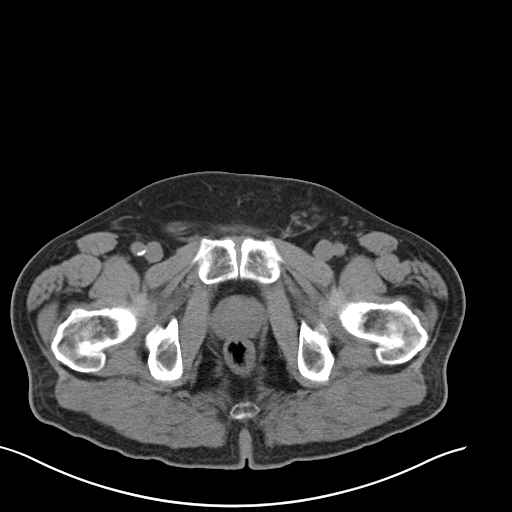
[im 19/97  soft-tissue]
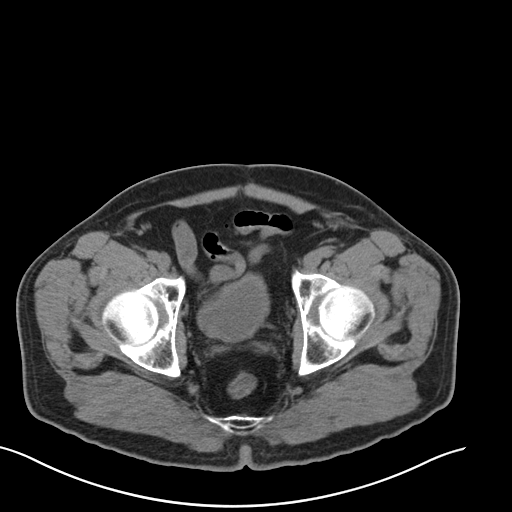
[im 26/97  soft-tissue]
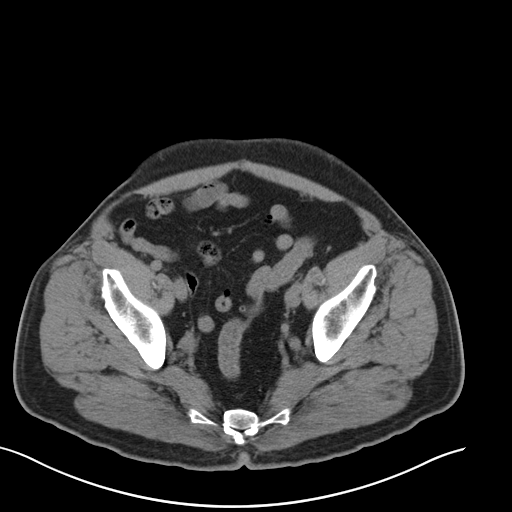
[im 34/97  soft-tissue]
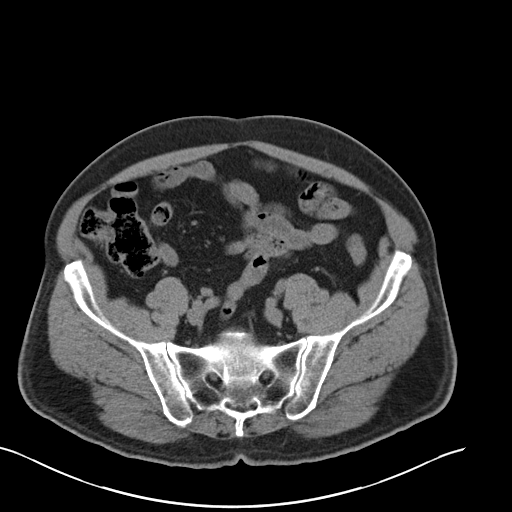
[im 41/97  soft-tissue]
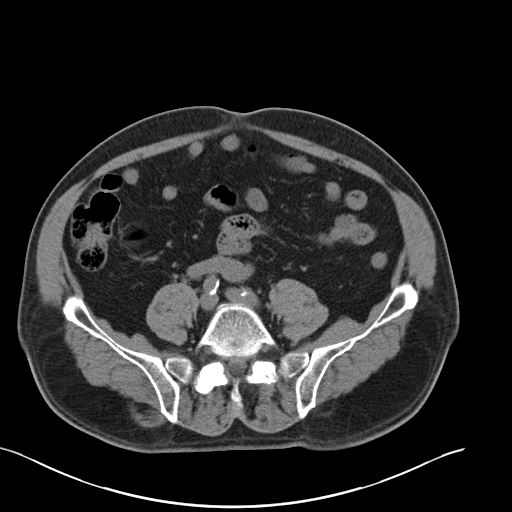
[im 49/97  soft-tissue]
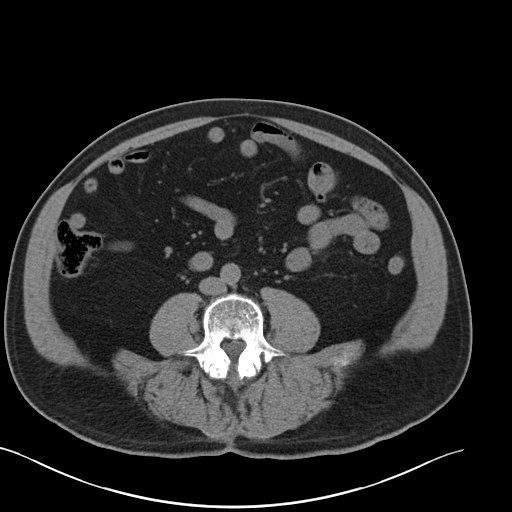
[im 56/97  soft-tissue]
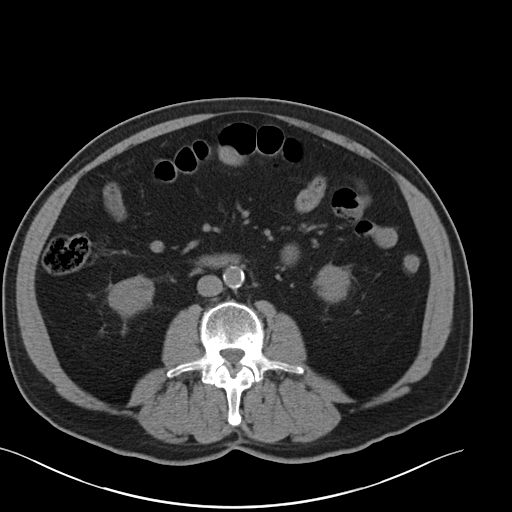
[im 63/97  soft-tissue]
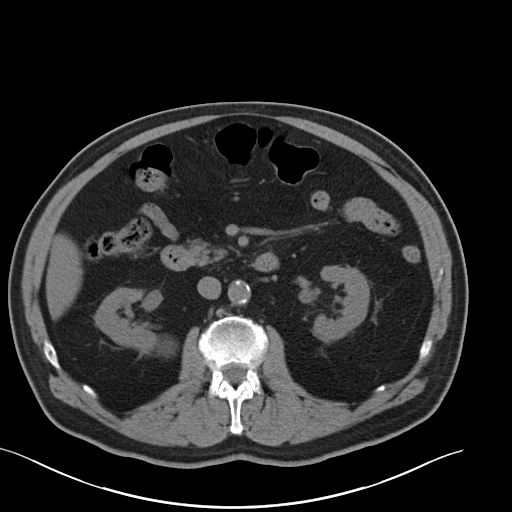
[im 63/97  bone]
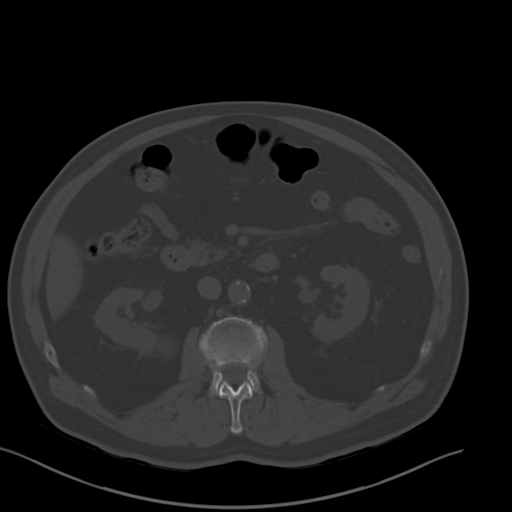
[im 71/97  soft-tissue]
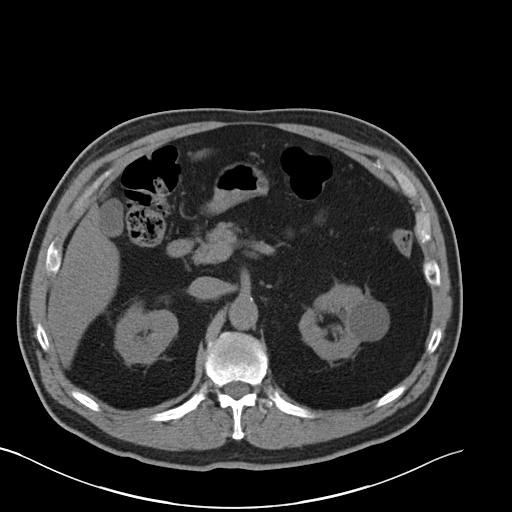
[im 78/97  soft-tissue]
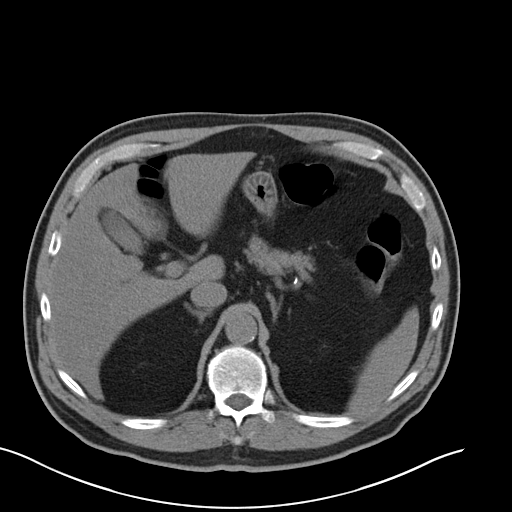
[im 82/97  lung]
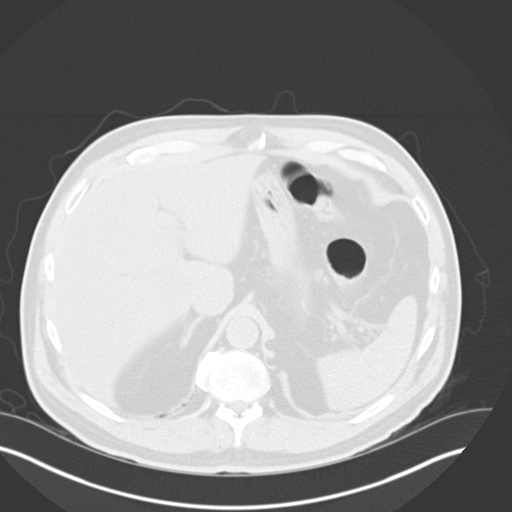
[im 85/97  soft-tissue]
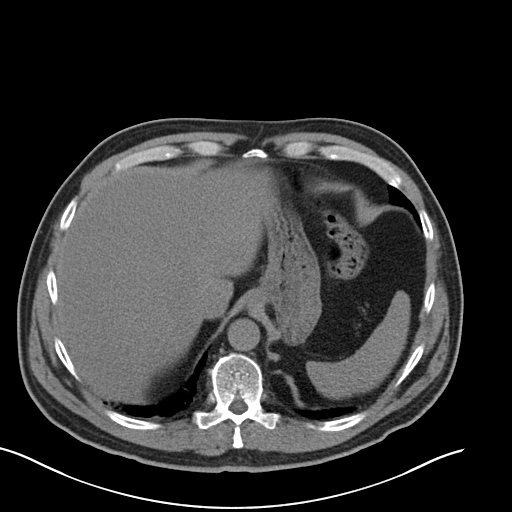
[im 85/97  lung]
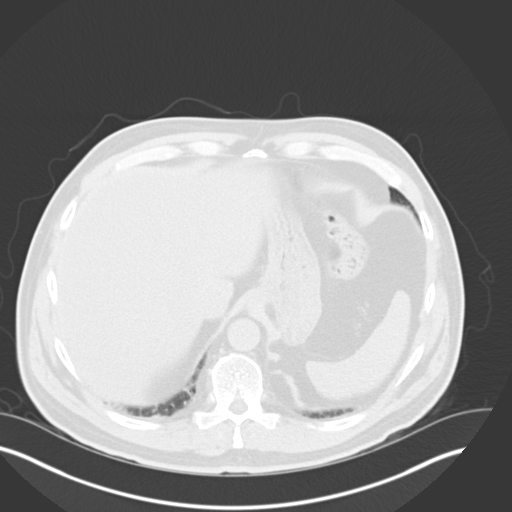
[im 89/97  lung]
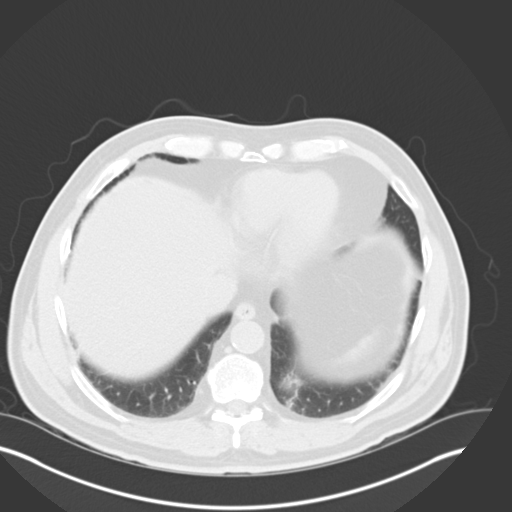
[im 93/97  soft-tissue]
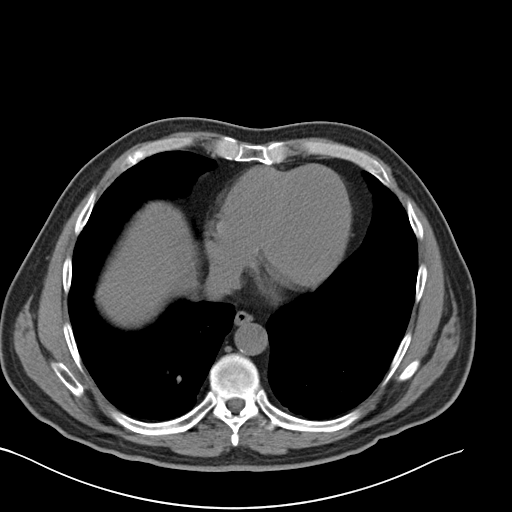
[im 93/97  lung]
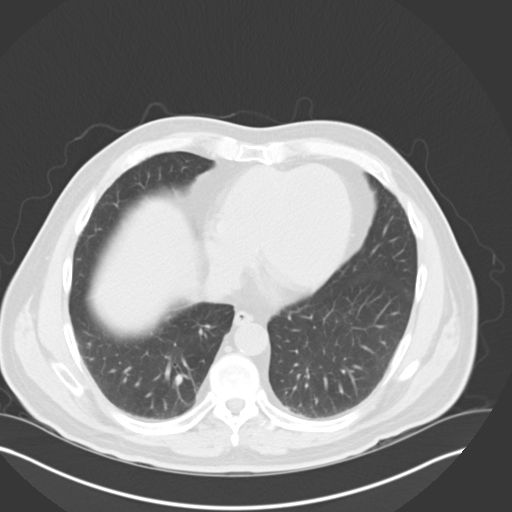

[15 of 32 positions shown; findings below may reference images not displayed]

FINDINGS: Bilateral renal cysts without evidence of renal
collecting system obstruction.

Within the decompressed noncontrast filled views of the urinary
bladder, possibility of thickening right posterior lateral aspect
of the bladder is raised and therefore raises possibility of tumor.

Coronary artery calcifications.  Heart size top normal.

Fatty liver.  Unenhanced imaging without focal hepatic, splenic,
the or adrenal or obvious pancreatic lesion.  No calcified
gallstone.

Minimal scarring lung bases.  No free intraperitoneal air or free
fluid.  No bowel inflammatory process.

Atherosclerotic type changes of the aorta and branch vessels.  No
abdominal aortic aneurysm.

Degenerative changes lumbar spine.  No bony destructive lesion.
IMPRESSION: Question bladder mass (right posterior lateral aspect).

Renal cysts without renal collecting system obstruction.

Fatty liver.

Atherosclerotic type changes coronary arteries, aorta and branch
vessels.

## 2012-02-06 ENCOUNTER — Emergency Department (HOSPITAL_COMMUNITY)
Admission: EM | Admit: 2012-02-06 | Discharge: 2012-02-07 | Disposition: A | Discharge status: Home / Self Care | Payer: Medicare Other | Attending: Emergency Medicine | Admitting: Emergency Medicine

## 2012-02-06 ENCOUNTER — Encounter (HOSPITAL_COMMUNITY): Payer: Self-pay | Admitting: *Deleted

## 2012-02-06 DIAGNOSIS — Z8739 Personal history of other diseases of the musculoskeletal system and connective tissue: Secondary | ICD-10-CM | POA: Insufficient documentation

## 2012-02-06 DIAGNOSIS — E119 Type 2 diabetes mellitus without complications: Secondary | ICD-10-CM | POA: Insufficient documentation

## 2012-02-06 DIAGNOSIS — Z7982 Long term (current) use of aspirin: Secondary | ICD-10-CM | POA: Insufficient documentation

## 2012-02-06 DIAGNOSIS — E785 Hyperlipidemia, unspecified: Secondary | ICD-10-CM | POA: Insufficient documentation

## 2012-02-06 DIAGNOSIS — Z79899 Other long term (current) drug therapy: Secondary | ICD-10-CM | POA: Insufficient documentation

## 2012-02-06 DIAGNOSIS — C679 Malignant neoplasm of bladder, unspecified: Secondary | ICD-10-CM | POA: Insufficient documentation

## 2012-02-06 DIAGNOSIS — Z87442 Personal history of urinary calculi: Secondary | ICD-10-CM | POA: Insufficient documentation

## 2012-02-06 DIAGNOSIS — C439 Malignant melanoma of skin, unspecified: Secondary | ICD-10-CM | POA: Insufficient documentation

## 2012-02-06 DIAGNOSIS — I10 Essential (primary) hypertension: Secondary | ICD-10-CM | POA: Insufficient documentation

## 2012-02-06 DIAGNOSIS — L03211 Cellulitis of face: Secondary | ICD-10-CM | POA: Insufficient documentation

## 2012-02-06 DIAGNOSIS — Z8719 Personal history of other diseases of the digestive system: Secondary | ICD-10-CM | POA: Insufficient documentation

## 2012-02-06 DIAGNOSIS — L0201 Cutaneous abscess of face: Secondary | ICD-10-CM | POA: Insufficient documentation

## 2012-02-06 DIAGNOSIS — Z862 Personal history of diseases of the blood and blood-forming organs and certain disorders involving the immune mechanism: Secondary | ICD-10-CM | POA: Insufficient documentation

## 2012-02-06 MED ORDER — CLINDAMYCIN HCL 300 MG PO CAPS
300.0000 mg | ORAL_CAPSULE | Freq: Once | ORAL | Status: AC
  Administered 2012-02-06: 300 mg via ORAL
  Filled 2012-02-06: qty 1

## 2012-02-06 MED ORDER — CLINDAMYCIN HCL 150 MG PO CAPS: 300.0000 mg | ORAL_CAPSULE | Freq: Three times a day (TID) | ORAL | Status: DC

## 2012-02-06 NOTE — ED Notes (Signed)
Pt states he had a small knot in mouth this am and then began swelling in facial area throughout day,  NAD,  Airway patent,  Pt is alert and oriented

## 2012-02-07 MED ORDER — HYDROCODONE-ACETAMINOPHEN 5-500 MG PO TABS: 1.0000 | ORAL_TABLET | Freq: Four times a day (QID) | ORAL | Status: DC | PRN

## 2012-02-07 NOTE — ED Provider Notes (Signed)
History     CSN: [id number]  Arrival date & time 02/06/12  2327   First MD Initiated Contact with Patient 02/06/12 2331      Chief Complaint  Patient presents with  . Facial Swelling    (Consider location/radiation/quality/duration/timing/severity/associated sxs/prior treatment) HPI History provided by patient. Onset yesterday. Location right inferior nare. Pain sharp in quality. No radiation. Progressively worsening today. No associated fever. Started as a bump and now feels like it is getting bigger with swelling that extends to right upper lip. Does take lisinopril. No lingual swelling. No history of ACE inhibitor angioedema. Has had a cold the last few days with congestion and runny nose. No rash. No shortness of breath. Moderate in severity. Past Medical History  Diagnosis Date  . Kidney stones   . Hypertension   . Diabetes mellitus   . Anemia   . Arthritis   . Skin cancer (melanoma) 1996    Left/ Back  . Bladder cancer 2012    scrapped bladder and inserted liquid chemo  . HERNIORRHAPHY, HX OF 01/07/2007    Qualifier: Diagnosis of  By: Yetta Barre CMA, Chemira    . Small bowel obstruction s/p open lysis of adhesions [id number] 08/23/2011  . Hyperlipidemia     Past Surgical History  Procedure Date  . 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 Hernia repair   . Knee arthroscopy     Left knee  . Colonoscopy 07/2002    neg  . Lumbar laminectomy 2004  . Shoulder surgery 05/2008  . Laparotomy 08/23/2011    Procedure: EXPLORATORY LAPAROTOMY;  Surgeon: Mariella Saa, MD;  Location: [facility];  Service: General;  Laterality: N/A;  Lysis of Adhesions/small bowel obstruction  . Abdominal adhesion surgery 08/23/2011    Procedure: EXPLORATORY LAPAROTOMY;  Surgeon: Mariella Saa, MD;  Location: [facility];  Service: General;  Laterality: N/A;  Lysis of Adhesions/small bowel obstruction    Family History  Problem Relation Age of Onset  . Diabetes Mother   . Heart attack Mother  51  . Heart disease Mother   . Heart attack Brother 54  . Lung cancer Brother   . Emphysema Brother   . Cancer Brother     lung  . Cancer Brother     Lymph node/neck  . Breast cancer Sister   . Cancer Sister     breast    History  Substance Use Topics  . Smoking status: Never Smoker   . Smokeless tobacco: Not on file  . Alcohol Use: No      Review of Systems  Constitutional: Negative for fever and chills.  HENT: Negative for neck pain and neck stiffness.   Eyes: Negative for pain.  Respiratory: Negative for shortness of breath and wheezing.   Cardiovascular: Negative for chest pain.  Gastrointestinal: Negative for nausea, vomiting and abdominal pain.  Genitourinary: Negative for dysuria.  Musculoskeletal: Negative for back pain.  Skin: Negative for rash.  Neurological: Negative for headaches.  All other systems reviewed and are negative.    Allergies  Review of patient's allergies indicates no known allergies.  Home Medications   Current Outpatient Rx  Name  Route  Sig  Dispense  Refill  . ASPIRIN 81 MG PO TABS   Oral   Take 81 mg by mouth daily.           Marland Kitchen BETAXOLOL HCL 0.25 % OP SUSP   Both Eyes   Place 1 drop into both eyes 2 (two)  times daily.           Marland Kitchen CARVEDILOL 25 MG PO TABS      TAKE 1 TABLET BY MOUTH TWICE A DAY   180 tablet   0   . FLAX SEED OIL 1000 MG PO CAPS   Oral   Take 1,000 mg by mouth once.           Marland Kitchen HYDROCODONE-ACETAMINOPHEN 5-325 MG PO TABS               . LISINOPRIL 20 MG PO TABS      TAKE 2 TABLETS BY MOUTH EVERY DAY   180 tablet   1   . NASCOBAL 500 MCG/0.1ML NA SOLN      USE 1 SPRAY IN ONE NOSTRIL ONCE DAILY   3.9 mL   1   . PRAVASTATIN SODIUM 40 MG PO TABS      TAKE 1 TABLET BY MOUTH EVERY DAY   90 tablet   0     **LABS DUE**   . SAW PALMETTO (SERENOA REPENS) 160 MG PO CAPS   Oral   Take 160 mg by mouth 2 (two) times daily.           Marland Kitchen VITAMIN D 1000 UNITS PO CAPS   Oral   Take 1,000  Units by mouth daily.           Marland Kitchen CLINDAMYCIN HCL 150 MG PO CAPS   Oral   Take 2 capsules (300 mg total) by mouth 3 (three) times daily.   21 capsule   0   . HYDROCODONE-ACETAMINOPHEN 5-500 MG PO TABS   Oral   Take 1-2 tablets by mouth every 6 (six) hours as needed for pain (no driving while medicated).   10 tablet   0     BP 162/87  Pulse 77  Temp 99.4 F (37.4 C) (Oral)  Resp 18  Ht 5\' 10"  (1.778 m)  Wt 214 lb (97.07 kg)  BMI 30.71 kg/m2  SpO2 98%  Physical Exam  Constitutional: He is oriented to person, place, and time. He appears well-developed and well-nourished.  HENT:  Head: Normocephalic and atraumatic.  Mouth/Throat: No oropharyngeal exudate.       Small palpable area of fluctuance just inferior to the right nare. is deep in the soft tissues. No pointing. No erythema or increased warmth to touch. There is associated swelling to the right upper lip. No angioedema to the tongue. Uvula midline. Posterior pharynx is clear. No dental tenderness.  Eyes: EOM are normal. Pupils are equal, round, and reactive to light.  Neck: Neck supple.  Cardiovascular: Normal rate, regular rhythm and intact distal pulses.   Pulmonary/Chest: Effort normal and breath sounds normal. No stridor. No respiratory distress. He has no wheezes.  Musculoskeletal: Normal range of motion. He exhibits no edema.  Lymphadenopathy:    He has no cervical adenopathy.  Neurological: He is alert and oriented to person, place, and time.  Skin: Skin is warm and dry.    ED Course  Procedures (including critical care time)  PO Clindamycin.  1. Facial abscess    Plan discharge home with 48 hour recheck and antibiotics. Pain medications as needed at nighttime. Reliable historian verbalizes understanding infection precautions and strict return precautions for any worsening condition.   MDM   Small right sided facial abscess without pointing.  Abscess is deep, small and decision made to start  antibiotics without attempted I&D at this time. Patient has scheduled followup with  his primary care physician Friday, also given referral to plastic surgery as needed. Vital signs and nursing notes reviewed. Presentation does not suggest ACE inhibitor angioedema.       Sunnie Nielsen, MD 02/07/12 [phone]

## 2012-02-09 ENCOUNTER — Encounter: Payer: Self-pay | Admitting: Internal Medicine

## 2012-02-09 ENCOUNTER — Ambulatory Visit (INDEPENDENT_AMBULATORY_CARE_PROVIDER_SITE_OTHER): Payer: Medicare Other | Admitting: Internal Medicine

## 2012-02-09 VITALS — BP 126/80 | HR 68 | Temp 98.2°F | Wt 211.0 lb

## 2012-02-09 DIAGNOSIS — IMO0001 Reserved for inherently not codable concepts without codable children: Secondary | ICD-10-CM

## 2012-02-09 DIAGNOSIS — T783XXA Angioneurotic edema, initial encounter: Secondary | ICD-10-CM

## 2012-02-09 DIAGNOSIS — L0390 Cellulitis, unspecified: Secondary | ICD-10-CM

## 2012-02-09 DIAGNOSIS — T465X5A Adverse effect of other antihypertensive drugs, initial encounter: Secondary | ICD-10-CM

## 2012-02-09 DIAGNOSIS — T464X5A Adverse effect of angiotensin-converting-enzyme inhibitors, initial encounter: Secondary | ICD-10-CM

## 2012-02-09 DIAGNOSIS — L0291 Cutaneous abscess, unspecified: Secondary | ICD-10-CM

## 2012-02-09 LAB — CBC WITH DIFFERENTIAL/PLATELET
Basophils Absolute: 0 10*3/uL (ref 0.0–0.1)
Basophils Relative: 0.1 % (ref 0.0–3.0)
Eosinophils Absolute: 0.2 10*3/uL (ref 0.0–0.7)
Eosinophils Relative: 1.7 % (ref 0.0–5.0)
HCT: 38.5 % — ABNORMAL LOW (ref 39.0–52.0)
Hemoglobin: 12.5 g/dL — ABNORMAL LOW (ref 13.0–17.0)
Lymphocytes Relative: 10.5 % — ABNORMAL LOW (ref 12.0–46.0)
Lymphs Abs: 1.3 10*3/uL (ref 0.7–4.0)
MCHC: 32.3 g/dL (ref 30.0–36.0)
MCV: 87.8 fl (ref 78.0–100.0)
Monocytes Absolute: 1 10*3/uL (ref 0.1–1.0)
Monocytes Relative: 8.1 % (ref 3.0–12.0)
Neutro Abs: 10.1 10*3/uL — ABNORMAL HIGH (ref 1.4–7.7)
Neutrophils Relative %: 79.6 % — ABNORMAL HIGH (ref 43.0–77.0)
Platelets: 201 10*3/uL (ref 150.0–400.0)
RBC: 4.39 Mil/uL (ref 4.22–5.81)
RDW: 13.5 % (ref 11.5–14.6)
WBC: 12.7 10*3/uL — ABNORMAL HIGH (ref 4.5–10.5)

## 2012-02-09 MED ORDER — GLIMEPIRIDE 2 MG PO TABS: 2.0000 mg | ORAL_TABLET | Freq: Every day | ORAL | Status: DC

## 2012-02-09 MED ORDER — TRAMADOL HCL 50 MG PO TABS: 50.0000 mg | ORAL_TABLET | Freq: Four times a day (QID) | ORAL | Status: DC | PRN

## 2012-02-09 MED ORDER — METFORMIN HCL 500 MG PO TABS: 500.0000 mg | ORAL_TABLET | Freq: Two times a day (BID) | ORAL | Status: DC

## 2012-02-09 NOTE — Patient Instructions (Addendum)
Please  schedule fasting Labs in 10 weeks : BMET,Lipids, hepatic panel, A1c, urine microalbumin, TSH. PLEASE BRING THESE INSTRUCTIONS TO FOLLOW UP  LAB APPOINTMENT.This will guarantee correct labs are drawn, eliminating need for repeat blood sampling ( needle sticks ! ). Diagnoses /Codes: 250.02, 272.4,995.20. Consume less than 40 (preferably ZERO) grams of sugar per day from foods & drinks with High Fructose Corn Syrup (HFCS) sugar as #1,2,3 or # 4 on label.Whole Foods, [employer] & [employer] do not carry products with HFCS. Follow a  low carb nutrition program such as [place] or [employer]  to prevent Diabetes progression . White carbohydrates (potatoes, rice, bread, and pasta) have a high spike of sugar and a high load of sugar. For example a  baked potato has a cup of sugar and a  french fry  2 teaspoons of sugar. Yams, wild  rice, whole grained bread &  wheat pasta have been much lower spike and load of  sugar. Portions should be the size of a deck of cards or your palm.  Blood Pressure Goal  Ideally is an AVERAGE < 135/85. This AVERAGE should be calculated from @ least 5-7 BP readings taken @ different times of day on different days of week. You should not respond to isolated BP readings , but rather the AVERAGE for that week  Review and correct the record as indicated. Please share record with all medical staff seen.   If you activate My Chart; the results can be released to you as soon as they populate from the lab. If you choose not to use this program; the labs have to be reviewed, copied & mailed   causing a delay in getting the results to you.

## 2012-02-09 NOTE — Progress Notes (Signed)
  Subjective:    Patient ID: Donald Carlson, male    DOB: [birth year], 66 y.o.   MRN: [id number]  HPI  History and ER records were reviewed. Symptoms began 11/18 as swelling of the right nasolabial fold area. This was in the context of recent head congestion.  In the emergency room his temperature was 99.4 blood pressure 162/87.  The emergency room physician astutely questioned the possibility of angioedema related to his ACE inhibitor but felt the clinical presentation suggested cellulitis/abscess. He was placed on clindamycin and followup recommended.  Since ER visit he denies fever, chills, or sweats. He's had no wheezing or shortness of breath. Has been no progression of the edema.    Review of Systems  By history his fasting blood sugars have averaged in the 130s. His A1c was 9.5% on 12/29/11. His wife states that he did ingest excess amounts of Halloween candy. His prior A1c had been 6.4%  On 11/04/10    Objective:   Physical Exam General appearance:good health ;well nourished; no acute distress or increased work of breathing is present.  No  lymphadenopathy about the head, neck, or axilla noted.   Eyes: No conjunctival inflammation or lid edema is present. EOMI ; vision normal  Ears:  External ear exam shows no significant lesions or deformities.  Otoscopic examination reveals clear canals, tympanic membranes are intact bilaterally without bulging, retraction, inflammation or discharge.  Nose:  External nasal examination shows no deformity or inflammation. Nasal mucosa are pink and moist without lesions or exudates. No septal dislocation or deviation.No obstruction to airflow.   Oral exam:  He has complete dentures; he  is not wearing his upper plate. There is dramatic swelling of the right nasolabial fold with mild erythema and decrease. Inside the right upper lip there are 2 large possible abscesses.  Neck:  No deformities, masses, or tenderness noted.   Supple with full range of  motion without pain.   Heart:  Normal rate and regular rhythm. S1 and S2 normal without gallop, murmur, click, rub or other extra sounds.   Lungs:Chest clear to auscultation; no wheezes, rhonchi,rales ,or rubs present.No increased work of breathing.    Extremities:  No cyanosis, edema, or clubbing  noted    Skin: Warm & dry          Assessment & Plan:  #1 localized erythema and edema right upper lip with possible buccal mucosal abscesses. He is on clindamycin. I am still concerned that this represents a primary angioedema reaction to lisinopril with possible secondary infection related to uncontrolled diabetes  #2 Uncontrolled diabetes manifested by an A1c of 9.5%. He is on no medications. He previously was on metformin but this was discontinued at the time of contrast studies for renal calculi evaluation.  Plan: Stat ENT consultation. Lisinopril will be discontinued. Aggressive diabetic management will be initiated.

## 2012-02-09 NOTE — Addendum Note (Signed)
Addended byPecola Lawless on: 02/09/2012 05:41 PM   Modules accepted: Orders

## 2012-03-01 ENCOUNTER — Telehealth: Payer: Self-pay | Admitting: Internal Medicine

## 2012-03-01 NOTE — Telephone Encounter (Signed)
Appointment Request From: [name] With Provider: Marga Melnick, MD [-Primary Care Physician-] Preferred Date Range: From 02/29/2012 To 03/19/2012 Preferred Times: Mon Morning Reason: To address the following health maintenance concerns. Colonoscopy Comments: I called Dr. Ardell Isaacs office but they said since I have seen any doctor for GI related issues they would need their records. Dr Johna Sheriff is the one who attended him during the bowel blockage so I guess that is what they need. They said at first he had never been there and then finally found the records where he had. Can you please get this scheduled for me since it is showing overdue on my chart. Please advise

## 2012-03-04 ENCOUNTER — Other Ambulatory Visit: Payer: Self-pay | Admitting: Internal Medicine

## 2012-03-04 NOTE — Telephone Encounter (Signed)
Spoke with patient, patient no linger taking Lisinopril.

## 2012-03-15 ENCOUNTER — Telehealth: Payer: Self-pay | Admitting: *Deleted

## 2012-03-15 NOTE — Telephone Encounter (Signed)
Please see notes; schedule F/U colonoscopy as indicated . Thanks, Hopp ----- Message ----- From: Mariella Saa, MD Sent: 03/03/2012 7:44 PM To: Pecola Lawless, MD Should be fine. Ben ----- Message ----- From: Pecola Lawless, MD Sent: 03/01/2012 9:49 AM To: Mariella Saa, MD, * Ben, do you feel it is safe for him to followup surveillance colonoscopy with [facility]? Apparently he's received a letter from them stating that his colonoscopy is overdue. Thanks for your input. Hopp Dr. Russella Dar I can't find that you have ever seen this patient. He had a procedure with Dr. Jarold Motto in 07/2002 that was normal. He is seeing in his chart that the health maintenance is overdue according to EP[employer]. Should I forward this to Dr. Jarold Motto, or do you know this patient? Imogene Burn with Dr Jarold Motto to schedule. Spoke with pt who is scheduled for a Direct Recall Colon on 04/05/12. As of today, there are no PV appts; I will see him for teaching on 03/29/12 if nothing opens up. Pt stated understanding.

## 2012-03-29 ENCOUNTER — Other Ambulatory Visit: Payer: Self-pay | Admitting: *Deleted

## 2012-03-29 ENCOUNTER — Telehealth: Payer: Self-pay | Admitting: Gastroenterology

## 2012-03-29 MED ORDER — PEG-KCL-NACL-NASULF-NA ASC-C 100 G PO SOLR: 1.0000 | Freq: Once | ORAL | Status: DC

## 2012-03-29 NOTE — Telephone Encounter (Signed)
Pt in for teaching for upcoming COLON. Pt wished to change the date and it was done; pt stated understanding. Consent signed and given to Eugene Garnet.

## 2012-03-29 NOTE — Progress Notes (Signed)
Pt here for COLON PV and wanted to change the day; done. Pt stated understanding.

## 2012-04-01 ENCOUNTER — Telehealth: Payer: Self-pay | Admitting: Internal Medicine

## 2012-04-01 MED ORDER — PRAVASTATIN SODIUM 40 MG PO TABS: ORAL_TABLET | ORAL | Status: DC

## 2012-04-01 NOTE — Telephone Encounter (Signed)
RX sent

## 2012-04-01 NOTE — Telephone Encounter (Signed)
Refill: pravastatin sodium 40 mg tab. Take 1 tablet every day. Qty 90. Last fill 12-04-11

## 2012-04-05 ENCOUNTER — Encounter: Payer: Medicare Other | Admitting: Gastroenterology

## 2012-04-08 ENCOUNTER — Encounter: Payer: Self-pay | Admitting: Gastroenterology

## 2012-04-08 ENCOUNTER — Ambulatory Visit (AMBULATORY_SURGERY_CENTER): Payer: Medicare Other | Admitting: Gastroenterology

## 2012-04-08 VITALS — BP 162/90 | HR 56 | Temp 97.5°F | Resp 16 | Ht 70.0 in | Wt 211.0 lb

## 2012-04-08 DIAGNOSIS — Z1211 Encounter for screening for malignant neoplasm of colon: Secondary | ICD-10-CM

## 2012-04-08 LAB — GLUCOSE, CAPILLARY
Glucose-Capillary: 103 mg/dL — ABNORMAL HIGH (ref 70–99)
Glucose-Capillary: 102 mg/dL — ABNORMAL HIGH (ref 70–99)

## 2012-04-08 MED ORDER — SODIUM CHLORIDE 0.9 % IV SOLN: 500.0000 mL | INTRAVENOUS | Status: DC

## 2012-04-08 NOTE — Progress Notes (Signed)
Patient did not experience any of the following events: a burn prior to discharge; a fall within the facility; wrong site/side/patient/procedure/implant event; or a hospital transfer or hospital admission upon discharge from the facility. (G8907) Patient did not have preoperative order for IV antibiotic SSI prophylaxis. (G8918)  

## 2012-04-08 NOTE — Op Note (Signed)
[facility] [address] [place], [address]   COLONOSCOPY PROCEDURE REPORT  PATIENT: Donald Carlson, Donald Carlson  MR#: [id number] BIRTHDATE: 11-Apr-1945 , 66  yrs. old GENDER: Male ENDOSCOPIST: Mardella Layman, MD, [facility] REFERRED BY: PROCEDURE DATE:  04/08/2012 PROCEDURE:   Colonoscopy, screening ASA CLASS:   Class II INDICATIONS:Average risk patient for colon cancer. MEDICATIONS: Propofol (Diprivan) 180 mg IV  DESCRIPTION OF PROCEDURE:   After the risks and benefits and of the procedure were explained, informed consent was obtained.  A digital rectal exam revealed no abnormalities of the rectum.    The LB [id number]  endoscope was introduced through the anus and advanced to the cecum, which was identified by both the appendix and ileocecal valve .  The quality of the prep was good, using MoviPrep .  The instrument was then slowly withdrawn as the colon was fully examined.     COLON FINDINGS: A normal appearing cecum, ileocecal valve, and appendiceal orifice were identified.  The ascending, hepatic flexure, transverse, splenic flexure, descending, sigmoid colon and rectum appeared unremarkable.  No polyps or cancers were seen. Retroflexed views revealed no abnormalities.     The scope was then withdrawn from the patient and the procedure completed.  COMPLICATIONS: There were no complications. ENDOSCOPIC IMPRESSION: Normal colon ...no polyps or cancer noted,,,  RECOMMENDATIONS: Continue current colorectal screening recommendations for "routine risk" patients with a repeat colonoscopy in 10 years.   REPEAT EXAM:  cc:Pecola Lawless, MD  _______________________________ eSigned:  Mardella Layman, MD, [facility] 04/08/2012 8:54 AM

## 2012-04-08 NOTE — Patient Instructions (Addendum)
YOU HAD AN ENDOSCOPIC PROCEDURE TODAY AT THE [facility]: Refer to the procedure report that was given to you for any specific questions about what was found during the examination.  If the procedure report does not answer your questions, please call your gastroenterologist to clarify.  If you requested that your care partner not be given the details of your procedure findings, then the procedure report has been included in a sealed envelope for you to review at your convenience later.  YOU SHOULD EXPECT: Some feelings of bloating in the abdomen. Passage of more gas than usual.  Walking can help get rid of the air that was put into your GI tract during the procedure and reduce the bloating. If you had a lower endoscopy (such as a colonoscopy or flexible sigmoidoscopy) you may notice spotting of blood in your stool or on the toilet paper. If you underwent a bowel prep for your procedure, then you may not have a normal bowel movement for a few days.  DIET: Your first meal following the procedure should be a light meal and then it is ok to progress to your normal diet.  A half-sandwich or bowl of soup is an example of a good first meal.  Heavy or fried foods are harder to digest and may make you feel nauseous or bloated.  Likewise meals heavy in dairy and vegetables can cause extra gas to form and this can also increase the bloating.  Drink plenty of fluids but you should avoid alcoholic beverages for 24 hours.  ACTIVITY: Your care partner should take you home directly after the procedure.  You should plan to take it easy, moving slowly for the rest of the day.  You can resume normal activity the day after the procedure however you should NOT DRIVE or use heavy machinery for 24 hours (because of the sedation medicines used during the test).    SYMPTOMS TO REPORT IMMEDIATELY: A gastroenterologist can be reached at any hour.  During normal business hours, 8:30 AM to 5:00 PM Monday through Friday,  call [phone].  After hours and on weekends, please call the GI answering service at [phone] who will take a message and have the physician on call contact you.   Following lower endoscopy (colonoscopy or flexible sigmoidoscopy):  Excessive amounts of blood in the stool  Significant tenderness or worsening of abdominal pains  Swelling of the abdomen that is new, acute  Fever of 100F or higher  FOLLOW UP: If any biopsies were taken you will be contacted by phone or by letter within the next 1-3 weeks.  Call your gastroenterologist if you have not heard about the biopsies in 3 weeks.  Our staff will call the home number listed on your records the next business day following your procedure to check on you and address any questions or concerns that you may have at that time regarding the information given to you following your procedure. This is a courtesy call and so if there is no answer at the home number and we have not heard from you through the emergency physician on call, we will assume that you have returned to your regular daily activities without incident.  SIGNATURES/CONFIDENTIALITY: You and/or your care partner have signed paperwork which will be entered into your electronic medical record.  These signatures attest to the fact that that the information above on your After Visit Summary has been reviewed and is understood.  Full responsibility of the confidentiality of this   discharge information lies with you and/or your care-partner.  Repeat colonoscopy in 10 years.  

## 2012-04-08 NOTE — Progress Notes (Signed)
[id number] a/ox3 pleased with MAC report to April RN

## 2012-04-09 ENCOUNTER — Telehealth: Payer: Self-pay

## 2012-04-09 NOTE — Telephone Encounter (Signed)
  Follow up Call-  Call back number 04/08/2012  Post procedure Call Back phone  # [phone]  Permission to leave phone message Yes     Patient questions:  Do you have a fever, pain , or abdominal swelling? no Pain Score  0 *  Have you tolerated food without any problems? yes  Have you been able to return to your normal activities? yes  Do you have any questions about your discharge instructions: Diet   no Medications  no Follow up visit  no  Do you have questions or concerns about your Care? no  Actions: * If pain score is 4 or above: No action needed, pain <4.

## 2012-04-16 ENCOUNTER — Telehealth: Payer: Self-pay | Admitting: Internal Medicine

## 2012-04-16 DIAGNOSIS — M109 Gout, unspecified: Secondary | ICD-10-CM

## 2012-04-16 MED ORDER — ALLOPURINOL 100 MG PO TABS: 100.0000 mg | ORAL_TABLET | Freq: Every day | ORAL | Status: DC

## 2012-04-16 MED ORDER — INDOMETHACIN 50 MG PO CAPS: 50.0000 mg | ORAL_CAPSULE | Freq: Two times a day (BID) | ORAL | Status: DC

## 2012-04-16 NOTE — Telephone Encounter (Signed)
RX was sent in today, patient's wife stats she is aware

## 2012-04-16 NOTE — Telephone Encounter (Signed)
Refills x 2 1-Allopurinol 100MG  tablet #90 last fill 6.13.12, Take 1 tablet every day   2 Indomethacin 50mg  capsule #20 last fill 5.2.12, tak one capsule by mouth twice daily

## 2012-04-16 NOTE — Telephone Encounter (Signed)
Refills for Indomethacin and Allopurinol sent to [facility] per wife's request

## 2012-04-16 NOTE — Telephone Encounter (Signed)
Caller: Louise/Spouse; Phone: [phone]; Reason for Call: Wife Sallye Ober calling, states that she called earlier and pharmacy also faxed over a request for a refill on his gout medication.   The pharmacy has not received orders.  She is worried that the road conditions will worsen.

## 2012-04-21 ENCOUNTER — Other Ambulatory Visit: Payer: Self-pay | Admitting: Internal Medicine

## 2012-04-24 ENCOUNTER — Telehealth: Payer: Self-pay | Admitting: Internal Medicine

## 2012-04-24 DIAGNOSIS — T887XXA Unspecified adverse effect of drug or medicament, initial encounter: Secondary | ICD-10-CM

## 2012-04-24 DIAGNOSIS — E785 Hyperlipidemia, unspecified: Secondary | ICD-10-CM

## 2012-04-24 DIAGNOSIS — IMO0001 Reserved for inherently not codable concepts without codable children: Secondary | ICD-10-CM

## 2012-04-24 NOTE — Telephone Encounter (Signed)
pt now wants to have labs drawn at [facility] per call from wife, can you put in orders for [facility] to collect please?

## 2012-04-24 NOTE — Telephone Encounter (Signed)
Orders placed.

## 2012-04-24 NOTE — Telephone Encounter (Signed)
Pt called [facility] orders in for [facility]

## 2012-04-26 ENCOUNTER — Other Ambulatory Visit (INDEPENDENT_AMBULATORY_CARE_PROVIDER_SITE_OTHER): Payer: Medicare Other

## 2012-04-26 ENCOUNTER — Other Ambulatory Visit: Payer: Medicare Other

## 2012-04-26 DIAGNOSIS — E785 Hyperlipidemia, unspecified: Secondary | ICD-10-CM

## 2012-04-26 DIAGNOSIS — T887XXA Unspecified adverse effect of drug or medicament, initial encounter: Secondary | ICD-10-CM

## 2012-04-26 DIAGNOSIS — IMO0001 Reserved for inherently not codable concepts without codable children: Secondary | ICD-10-CM

## 2012-04-26 LAB — LIPID PANEL
Cholesterol: 147 mg/dL (ref 0–200)
HDL: 31.7 mg/dL — ABNORMAL LOW (ref >39.00)
LDL Cholesterol: 101 mg/dL — ABNORMAL HIGH (ref 0–99)
Total CHOL/HDL Ratio: 5
Triglycerides: 72 mg/dL (ref 0.0–149.0)
VLDL: 14.4 mg/dL (ref 0.0–40.0)

## 2012-04-26 LAB — HEPATIC FUNCTION PANEL
ALT: 32 U/L (ref 0–53)
AST: 24 U/L (ref 0–37)
Albumin: 3.7 g/dL (ref 3.5–5.2)
Alkaline Phosphatase: 56 U/L (ref 39–117)
Bilirubin, Direct: 0.1 mg/dL (ref 0.0–0.3)
Total Bilirubin: 0.6 mg/dL (ref 0.3–1.2)
Total Protein: 7 g/dL (ref 6.0–8.3)

## 2012-04-26 LAB — BASIC METABOLIC PANEL
BUN: 19 mg/dL (ref 6–23)
CO2: 29 mEq/L (ref 19–32)
Calcium: 9.3 mg/dL (ref 8.4–10.5)
Chloride: 108 mEq/L (ref 96–112)
Creatinine, Ser: 1.1 mg/dL (ref 0.4–1.5)
GFR: 74.01 mL/min (ref >60.00)
Glucose, Bld: 118 mg/dL — ABNORMAL HIGH (ref 70–99)
Potassium: 4.5 mEq/L (ref 3.5–5.1)
Sodium: 142 mEq/L (ref 135–145)

## 2012-04-26 LAB — TSH: TSH: 1.53 u[IU]/mL (ref 0.35–5.50)

## 2012-04-26 LAB — MICROALBUMIN / CREATININE URINE RATIO
Creatinine,U: 158.2 mg/dL
Microalb Creat Ratio: 1.1 mg/g (ref 0.0–30.0)
Microalb, Ur: 1.8 mg/dL (ref 0.0–1.9)

## 2012-04-26 LAB — HEMOGLOBIN A1C: Hgb A1c MFr Bld: 6.4 % (ref 4.6–6.5)

## 2012-04-28 ENCOUNTER — Other Ambulatory Visit: Payer: Self-pay | Admitting: Internal Medicine

## 2012-05-03 ENCOUNTER — Ambulatory Visit: Payer: Medicare Other | Admitting: Internal Medicine

## 2012-05-04 ENCOUNTER — Other Ambulatory Visit: Payer: Self-pay

## 2012-05-09 ENCOUNTER — Encounter: Payer: Self-pay | Admitting: Lab

## 2012-05-10 ENCOUNTER — Ambulatory Visit (INDEPENDENT_AMBULATORY_CARE_PROVIDER_SITE_OTHER): Payer: Medicare Other | Admitting: Internal Medicine

## 2012-05-10 ENCOUNTER — Encounter: Payer: Self-pay | Admitting: Internal Medicine

## 2012-05-10 ENCOUNTER — Telehealth: Payer: Self-pay | Admitting: Internal Medicine

## 2012-05-10 VITALS — BP 152/94 | HR 74 | Wt 219.2 lb

## 2012-05-10 DIAGNOSIS — E1059 Type 1 diabetes mellitus with other circulatory complications: Secondary | ICD-10-CM

## 2012-05-10 DIAGNOSIS — E782 Mixed hyperlipidemia: Secondary | ICD-10-CM

## 2012-05-10 DIAGNOSIS — I10 Essential (primary) hypertension: Secondary | ICD-10-CM

## 2012-05-10 DIAGNOSIS — E119 Type 2 diabetes mellitus without complications: Secondary | ICD-10-CM

## 2012-05-10 MED ORDER — LISINOPRIL 20 MG PO TABS: 20.0000 mg | ORAL_TABLET | Freq: Every day | ORAL | Status: DC

## 2012-05-10 NOTE — Telephone Encounter (Signed)
Please schedule fasting Labs in 12 weeks after exercise & nutrition changes: BMET, A1c. PLEASE BRING THESE INSTRUCTIONS TO FOLLOW UP LAB APPOINTMENT.This will guarantee correct labs are drawn, eliminating need for repeat blood sampling ( needle sticks ! ). Diagnoses /Codes: 250.00, 401.9   Patient states that he will be going to the [facility]. Please order labs. Thanks!

## 2012-05-10 NOTE — Progress Notes (Signed)
  Subjective:    Patient ID: Donald Carlson, male    DOB: 1945-12-19, 67 y.o.   MRN: [id number]  HPI The patient is here for followup of diabetes hyperlipidemia and hypertension.  The most recent A1c 04/26/12 was 6.4% , which correlates to an average sugar of137  , and long-term risk of  28%. Fasting blood sugar range 86-129 average 108 .  Highest two-hour postprandial glucose is < 125 . No hypoglycemia reported Ophthalmologic examination  today. No active podiatry assessment on record. No diet . No exercise .  The most recent lipids 2/7  reveal LDL 101,HDL31.7,  and triglycerides 72   . There is medical compliance with the statin. Blood pressure range  average  .   There is medical  compliance with antihypertensive medications BP average 170/95; range 138/66-203/100  No  medication adverse effects suggested        Review of Systems Constitutional: Weight change of 8#; excess fatigue Eyes: No  blurred vision double vision  loss of vision Cardiovascular: no chest pain, palpitations, racing, irregular rhythm ,syncope, claudication , edema  Respiratory: No exertional dyspnea ,paroxysmal nocturnal dyspnea Musculoskeletal: No myalgias or muscle cramping  Dermatologic: No non healing  Lesions, change in color or temperature of skin Neurologic: No  limb weakness , numbness or tingling, burning Endocrine: No change in hair ,skin, nails. No excessive thirst excessive hunger, or excessive urination ,      Objective:   Physical Exam Gen.: Healthy  & well-nourished, appropriate and alert, weight Eyes: No lid/conjunctival changes, no icterus Neck: Normal thyroid . Respiratory: No increased work of breathing or abnormal breath sounds Cardiac : regular rhythm, no extra heart sounds, gallop, murmur Abdomen: No organomegaly ,masses, bruits or aortic enlargement Lymph: No lymphadenopathy of the neck or axilla Skin: No rashes, lesions, ulcers or ischemic changes Muscle skeletal: no  nail changes; joints normal Vasc:All pulses intact, no bruits present Neuro: Normal deep tendon reflexes, alert & oriented, sensation over feet normal Psych: judgment and insight, mood and affect normal        Assessment & Plan:

## 2012-05-10 NOTE — Patient Instructions (Addendum)
Minimal Blood Pressure Goal= AVERAGE < 140/90;  Ideal is an AVERAGE < 135/85. This AVERAGE should be calculated from @ least 5-7 BP readings taken @ different times of day on different days of week. You should not respond to isolated BP readings , but rather the AVERAGE for that week .Please bring your  blood pressure cuff to office visits to verify that it is reliable.It  can also be checked against the blood pressure device at the pharmacy. Finger or wrist cuffs are not dependable; an arm cuff is.   Restart lisinopril 1 daily. Increase to 2 daily if blood pressure not at goal after 10-14 days.Report any significant cough or swelling of the lips or tongue with the new blood pressure pill. These are not expected but there are rare occurrences. Stop the medicine immediately should either occur.  Please  schedule fasting Labs in 12 weeks after exercise & nutrition changes: BMET, A1c. PLEASE BRING THESE INSTRUCTIONS TO FOLLOW UP  LAB APPOINTMENT.This will guarantee correct labs are drawn, eliminating need for repeat blood sampling ( needle sticks ! ). Diagnoses /Codes: 250.00, 401.9

## 2012-05-10 NOTE — Assessment & Plan Note (Signed)
Focus on lifestyle; A1c 12 weeks

## 2012-05-10 NOTE — Assessment & Plan Note (Signed)
Restart ACE-I; BP goals discussed

## 2012-05-10 NOTE — Assessment & Plan Note (Signed)
No change in statin; CVE & good nutrition

## 2012-05-10 NOTE — Telephone Encounter (Signed)
Order placed

## 2012-05-23 ENCOUNTER — Encounter: Payer: Self-pay | Admitting: Internal Medicine

## 2012-05-23 ENCOUNTER — Other Ambulatory Visit: Payer: Self-pay | Admitting: Internal Medicine

## 2012-05-27 ENCOUNTER — Other Ambulatory Visit: Payer: Self-pay | Admitting: *Deleted

## 2012-05-27 DIAGNOSIS — E119 Type 2 diabetes mellitus without complications: Secondary | ICD-10-CM

## 2012-05-27 MED ORDER — GLIMEPIRIDE 2 MG PO TABS: ORAL_TABLET | ORAL | Status: DC

## 2012-05-27 NOTE — Telephone Encounter (Signed)
Refill for amaryl sent to [facility] on [address]

## 2012-05-29 ENCOUNTER — Other Ambulatory Visit: Payer: Self-pay | Admitting: *Deleted

## 2012-06-02 ENCOUNTER — Other Ambulatory Visit: Payer: Self-pay | Admitting: Internal Medicine

## 2012-06-13 ENCOUNTER — Encounter: Payer: Self-pay | Admitting: Internal Medicine

## 2012-06-14 ENCOUNTER — Other Ambulatory Visit: Payer: Self-pay

## 2012-06-14 MED ORDER — VARDENAFIL HCL 20 MG PO TABS: ORAL_TABLET | ORAL | Status: DC

## 2012-06-14 NOTE — Telephone Encounter (Signed)
Hopp please advise if Levitra can be added to patient's list. Please give dose, instruction, and dispense number

## 2012-06-14 NOTE — Telephone Encounter (Signed)
Please advise on dosage: 2.5, 5, 10, or 20 mg ?

## 2012-06-18 ENCOUNTER — Inpatient Hospital Stay (HOSPITAL_COMMUNITY): Payer: Worker's Compensation

## 2012-06-18 ENCOUNTER — Inpatient Hospital Stay (HOSPITAL_COMMUNITY)
Admission: EM | Admit: 2012-06-18 | Discharge: 2012-06-21 | DRG: 482 | Disposition: A | Discharge status: Home Health | Payer: Worker's Compensation | Attending: Internal Medicine | Admitting: Internal Medicine

## 2012-06-18 ENCOUNTER — Emergency Department (HOSPITAL_COMMUNITY): Payer: Worker's Compensation

## 2012-06-18 ENCOUNTER — Encounter (HOSPITAL_COMMUNITY): Payer: Self-pay | Admitting: Emergency Medicine

## 2012-06-18 DIAGNOSIS — E785 Hyperlipidemia, unspecified: Secondary | ICD-10-CM | POA: Diagnosis present

## 2012-06-18 DIAGNOSIS — S72009A Fracture of unspecified part of neck of unspecified femur, initial encounter for closed fracture: Secondary | ICD-10-CM

## 2012-06-18 DIAGNOSIS — E1165 Type 2 diabetes mellitus with hyperglycemia: Secondary | ICD-10-CM | POA: Diagnosis present

## 2012-06-18 DIAGNOSIS — S72002S Fracture of unspecified part of neck of left femur, sequela: Secondary | ICD-10-CM

## 2012-06-18 DIAGNOSIS — I152 Hypertension secondary to endocrine disorders: Secondary | ICD-10-CM | POA: Diagnosis present

## 2012-06-18 DIAGNOSIS — M109 Gout, unspecified: Secondary | ICD-10-CM

## 2012-06-18 DIAGNOSIS — R0989 Other specified symptoms and signs involving the circulatory and respiratory systems: Secondary | ICD-10-CM

## 2012-06-18 DIAGNOSIS — Z8679 Personal history of other diseases of the circulatory system: Secondary | ICD-10-CM

## 2012-06-18 DIAGNOSIS — H4089 Other specified glaucoma: Secondary | ICD-10-CM

## 2012-06-18 DIAGNOSIS — S72002A Fracture of unspecified part of neck of left femur, initial encounter for closed fracture: Secondary | ICD-10-CM

## 2012-06-18 DIAGNOSIS — E119 Type 2 diabetes mellitus without complications: Secondary | ICD-10-CM | POA: Diagnosis present

## 2012-06-18 DIAGNOSIS — R748 Abnormal levels of other serum enzymes: Secondary | ICD-10-CM

## 2012-06-18 DIAGNOSIS — E1140 Type 2 diabetes mellitus with diabetic neuropathy, unspecified: Secondary | ICD-10-CM | POA: Diagnosis present

## 2012-06-18 DIAGNOSIS — I10 Essential (primary) hypertension: Secondary | ICD-10-CM | POA: Diagnosis present

## 2012-06-18 DIAGNOSIS — W11XXXA Fall on and from ladder, initial encounter: Secondary | ICD-10-CM | POA: Diagnosis present

## 2012-06-18 DIAGNOSIS — S72033A Displaced midcervical fracture of unspecified femur, initial encounter for closed fracture: Principal | ICD-10-CM | POA: Diagnosis present

## 2012-06-18 DIAGNOSIS — E782 Mixed hyperlipidemia: Secondary | ICD-10-CM | POA: Diagnosis present

## 2012-06-18 DIAGNOSIS — S72002D Fracture of unspecified part of neck of left femur, subsequent encounter for closed fracture with routine healing: Secondary | ICD-10-CM

## 2012-06-18 DIAGNOSIS — E1169 Type 2 diabetes mellitus with other specified complication: Secondary | ICD-10-CM | POA: Diagnosis present

## 2012-06-18 DIAGNOSIS — K56609 Unspecified intestinal obstruction, unspecified as to partial versus complete obstruction: Secondary | ICD-10-CM

## 2012-06-18 LAB — CBC
HCT: 38.2 % — ABNORMAL LOW (ref 39.0–52.0)
Hemoglobin: 12.6 g/dL — ABNORMAL LOW (ref 13.0–17.0)
MCH: 28.1 pg (ref 26.0–34.0)
MCHC: 33 g/dL (ref 30.0–36.0)
MCV: 85.3 fL (ref 78.0–100.0)
Platelets: 184 10*3/uL (ref 150–400)
RBC: 4.48 MIL/uL (ref 4.22–5.81)
RDW: 13.6 % (ref 11.5–15.5)
WBC: 11.2 10*3/uL — ABNORMAL HIGH (ref 4.0–10.5)

## 2012-06-18 LAB — GLUCOSE, CAPILLARY
Glucose-Capillary: 130 mg/dL — ABNORMAL HIGH (ref 70–99)
Glucose-Capillary: 137 mg/dL — ABNORMAL HIGH (ref 70–99)
Glucose-Capillary: 69 mg/dL — ABNORMAL LOW (ref 70–99)

## 2012-06-18 LAB — PROTIME-INR
INR: 1.06 (ref 0.00–1.49)
Prothrombin Time: 13.7 seconds (ref 11.6–15.2)

## 2012-06-18 LAB — TYPE AND SCREEN
ABO/RH(D): B POS
Antibody Screen: NEGATIVE

## 2012-06-18 LAB — BASIC METABOLIC PANEL
BUN: 20 mg/dL (ref 6–23)
CO2: 26 mEq/L (ref 19–32)
Calcium: 9.6 mg/dL (ref 8.4–10.5)
Chloride: 105 mEq/L (ref 96–112)
Creatinine, Ser: 1.12 mg/dL (ref 0.50–1.35)
GFR calc Af Amer: 77 mL/min — ABNORMAL LOW (ref >90)
GFR calc non Af Amer: 66 mL/min — ABNORMAL LOW (ref >90)
Glucose, Bld: 76 mg/dL (ref 70–99)
Potassium: 4.3 mEq/L (ref 3.5–5.1)
Sodium: 140 mEq/L (ref 135–145)

## 2012-06-18 LAB — APTT: aPTT: 35 seconds (ref 24–37)

## 2012-06-18 MED ORDER — CARVEDILOL 25 MG PO TABS
25.0000 mg | ORAL_TABLET | Freq: Two times a day (BID) | ORAL | Status: DC
  Administered 2012-06-19 – 2012-06-21 (×5): 25 mg via ORAL
  Filled 2012-06-18 (×7): qty 1

## 2012-06-18 MED ORDER — ENOXAPARIN SODIUM 60 MG/0.6ML ~~LOC~~ SOLN
50.0000 mg | SUBCUTANEOUS | Status: DC
  Administered 2012-06-18: 50 mg via SUBCUTANEOUS
  Administered 2012-06-19: 20:00:00 via SUBCUTANEOUS
  Filled 2012-06-18 (×3): qty 0.6

## 2012-06-18 MED ORDER — OXYCODONE-ACETAMINOPHEN 5-325 MG PO TABS
2.0000 | ORAL_TABLET | Freq: Once | ORAL | Status: AC
  Administered 2012-06-18: 2 via ORAL
  Filled 2012-06-18: qty 2

## 2012-06-18 MED ORDER — ENOXAPARIN SODIUM 40 MG/0.4ML ~~LOC~~ SOLN: 40.0000 mg | SUBCUTANEOUS | Status: DC

## 2012-06-18 MED ORDER — SODIUM CHLORIDE 0.9 % IV SOLN
INTRAVENOUS | Status: DC
  Administered 2012-06-18: 20:00:00 via INTRAVENOUS

## 2012-06-18 MED ORDER — MORPHINE SULFATE 2 MG/ML IJ SOLN: 2.0000 mg | INTRAMUSCULAR | Status: DC | PRN

## 2012-06-18 MED ORDER — ALLOPURINOL 100 MG PO TABS
100.0000 mg | ORAL_TABLET | Freq: Every day | ORAL | Status: DC
  Administered 2012-06-19: 100 mg via ORAL
  Filled 2012-06-18 (×3): qty 1

## 2012-06-18 MED ORDER — DOCUSATE SODIUM 100 MG PO CAPS
100.0000 mg | ORAL_CAPSULE | Freq: Two times a day (BID) | ORAL | Status: DC
  Administered 2012-06-18 – 2012-06-21 (×6): 100 mg via ORAL

## 2012-06-18 MED ORDER — SIMVASTATIN 20 MG PO TABS
20.0000 mg | ORAL_TABLET | Freq: Every day | ORAL | Status: DC
  Administered 2012-06-19 – 2012-06-20 (×2): 20 mg via ORAL
  Filled 2012-06-18 (×3): qty 1

## 2012-06-18 MED ORDER — BETAXOLOL HCL 0.25 % OP SUSP
1.0000 [drp] | Freq: Two times a day (BID) | OPHTHALMIC | Status: DC
  Administered 2012-06-18 – 2012-06-21 (×6): 1 [drp] via OPHTHALMIC
  Filled 2012-06-18: qty 10

## 2012-06-18 MED ORDER — MORPHINE SULFATE 2 MG/ML IJ SOLN: 0.5000 mg | INTRAMUSCULAR | Status: DC | PRN

## 2012-06-18 MED ORDER — HYDROCODONE-ACETAMINOPHEN 5-325 MG PO TABS
1.0000 | ORAL_TABLET | ORAL | Status: DC | PRN
  Administered 2012-06-18 – 2012-06-19 (×4): 2 via ORAL
  Administered 2012-06-19: 1 via ORAL
  Administered 2012-06-19 – 2012-06-21 (×8): 2 via ORAL
  Filled 2012-06-18 (×13): qty 2

## 2012-06-18 MED ORDER — METHOCARBAMOL 100 MG/ML IJ SOLN
500.0000 mg | Freq: Four times a day (QID) | INTRAVENOUS | Status: DC | PRN
  Filled 2012-06-18: qty 5

## 2012-06-18 MED ORDER — HYDROCODONE-ACETAMINOPHEN 5-325 MG PO TABS: 1.0000 | ORAL_TABLET | Freq: Four times a day (QID) | ORAL | Status: DC | PRN

## 2012-06-18 MED ORDER — LISINOPRIL 20 MG PO TABS
20.0000 mg | ORAL_TABLET | Freq: Every day | ORAL | Status: DC
  Administered 2012-06-19 – 2012-06-21 (×3): 20 mg via ORAL
  Filled 2012-06-18 (×3): qty 1

## 2012-06-18 MED ORDER — HYDRALAZINE HCL 20 MG/ML IJ SOLN
10.0000 mg | INTRAMUSCULAR | Status: DC | PRN
  Administered 2012-06-19: 22:00:00 via INTRAVENOUS
  Administered 2012-06-19 – 2012-06-20 (×2): 10 mg via INTRAVENOUS
  Filled 2012-06-18 (×3): qty 1

## 2012-06-18 MED ORDER — INSULIN ASPART 100 UNIT/ML ~~LOC~~ SOLN
0.0000 [IU] | SUBCUTANEOUS | Status: DC
  Administered 2012-06-18: 2 [IU] via SUBCUTANEOUS

## 2012-06-18 NOTE — ED Notes (Signed)
[id number]<AV> Expected date:<BR> Expected time:<BR> Means of arrival:<BR> Comments:<BR> Hold for triage

## 2012-06-18 NOTE — Significant Event (Signed)
D: 67 yo WM admitted under [facility] for consult and pending ortho surgery to repair fracture of left hip.  Pt with h/o htn, dm, kidney stones, anemia, skin ca removed in 1996, bladder ca 2012 s/p scrapping and liquid chemo, sbo with lysis of adhesions 08/2011.  Pt fell from ladder today at approx 4pm and came to ed with diagnosis of left hip fracture in need of surgical repair.  Per pt orders, CBG taken at 1951 and noted to be 69. A: Assessed pt. Pt in NAD.  No c/o lightheadedness, n/v, sob, ha, blurred vision. Pt is not diaphoretic.  Pt is A/Ox4. On call for [facility] notified of results and pt's condition. Pt's family at bedside with meal for pt (cheeseburger, fries and a drink). Discussed with on call md protocol vs just letting pt finally eat a meal (pt states he has not eaten since 1pm). Plan to let pt eat his dinner and then reassess blood sugar in 1hr.  R: Pt at 100% of his meal. At 2055 pt's CBG 130.  MD aware.  Will recheck per orders at 0000.  Will continue to monitor pt status.   Kathreen Devoid, RN

## 2012-06-18 NOTE — Consult Note (Signed)
Marga Melnick, MD Chief Complaint: S/p fall with left hip pain. History: 67 year old male with history of hypertension, diabetes and hyperlipidemia presented to the ED after falling from a ladder on a concrete surface while at work this morning and and on his left hip. He denies sustaining any other injury or loss of consciousness. Fall was purely mechanical. Denies any dizziness, headache, blurry vision, chest pain, palpitations, shortness of breath, abdominal pain, nausea, vomiting, fever, chills, bowel or urinary symptoms.  In the ED vitals were stable. She had severe pain on his left hip for which an x-ray of the hip was done which showed a probable nondisplaced subcapital left fracture. Hospitalist called for admission. Ortho Consult called from ED  Past Medical History  Diagnosis Date  . Kidney stones   . Hypertension   . Diabetes mellitus   . Anemia   . Arthritis   . Skin cancer (melanoma) 1996    Left/ Back  . Bladder cancer 2012    scrapped bladder and inserted liquid chemo  . HERNIORRHAPHY, HX OF 01/07/2007    Qualifier: Diagnosis of  By: Yetta Barre CMA, Chemira    . Small bowel obstruction s/p open lysis of adhesions [id number] 08/23/2011  . Hyperlipidemia     No Known Allergies  No current facility-administered medications on file prior to encounter.   Current Outpatient Prescriptions on File Prior to Encounter  Medication Sig Dispense Refill  . allopurinol (ZYLOPRIM) 100 MG tablet Take 1 tablet (100 mg total) by mouth daily.  30 tablet  6  . aspirin 81 MG tablet Take 81 mg by mouth daily.        . betaxolol (BETOPTIC-S) 0.25 % ophthalmic suspension Place 1 drop into both eyes 2 (two) times daily.        . Cholecalciferol (VITAMIN D) 1000 UNITS capsule Take 1,000 Units by mouth daily.        . Flaxseed, Linseed, (FLAX SEED OIL) 1000 MG CAPS Take 1,000 mg by mouth once.        Marland Kitchen lisinopril (PRINIVIL,ZESTRIL) 20 MG tablet Take 1 tablet (20 mg total) by mouth daily.  90 tablet  3   . saw palmetto 160 MG capsule Take 160 mg by mouth 2 (two) times daily.          Physical Exam: A+O X3 NVI - 2+ DP/PT pulses EHL/TA/GA intact Compartments soft/nt L hip/groin pain  No knee/.ankle pain No SOB/CP Abd soft/NT  Filed Vitals:   06/18/12 2058  BP: 170/75  Pulse: 90  Temp: 98 F (36.7 C)  Resp:     Image: Dg Hip Complete Left  06/18/2012  *RADIOLOGY REPORT*  Clinical Data: Fall with left hip pain.  LEFT HIP - COMPLETE 2+ VIEW  Comparison: None.  Findings: There is foreshortening and abnormal rotation of the left hip relative to the right.  It is suspected that there is a probable subtle subcapital fracture.  This appears nondisplaced and not significantly impacted.  The rest of the bony pelvis is intact.  IMPRESSION: Probable subtle nondisplaced subcapital left hip fracture.   Original Report Authenticated By: Irish Lack, M.D.    Dg Chest Portable 1 View  06/18/2012  *RADIOLOGY REPORT*  Clinical Data: Preop, left hip injury  PORTABLE CHEST - 1 VIEW  Comparison: 01/03/2011  Findings: Chronic interstitial markings.  No focal consolidation. No pleural effusion or pneumothorax.  The heart is normal in size.  IMPRESSION: No evidence of acute cardiopulmonary disease.   Original Report Authenticated By: Charline Bills,  M.D.     A/P:  Patient s/p fall from ladder with left hip pain and inability to weight bear. Xrays demonstrate left femoral neck fracture - non-displaced Case discussed with Dr Shelle Iron - will plan on definitive fracture management tomorrow Patient and wife's questions were addressed Consent for CRPP vs Hemiarthroplasty  Admit per hip fracture protocal

## 2012-06-18 NOTE — ED Provider Notes (Signed)
History     CSN: [id number]  Arrival date & time 06/18/12  1630   First MD Initiated Contact with Patient 06/18/12 1636      Chief Complaint  Patient presents with  . Fall  . Hip Pain    (Consider location/radiation/quality/duration/timing/severity/associated sxs/prior treatment) HPI Comments: 67 y/o male presents to the ED complaining of left hip pain s/p fall off a 14 inch ladder at work 1 hour prior to arrival. [place] he landed directly onto his left hip. Pain currently 9/10, worse with any movement. No alleviating factors have been tried at this time. Takes a daily 81 mg ASA. No prior injury to this hip. Denies numbness or tingling in his extremity. Denies hitting his head or LOC.  Patient is a 67 y.o. male presenting with fall and hip pain. The history is provided by the patient.  Fall Pertinent negatives include no numbness.  Hip Pain Pertinent negatives include no numbness.    Past Medical History  Diagnosis Date  . Kidney stones   . Hypertension   . Diabetes mellitus   . Anemia   . Arthritis   . Skin cancer (melanoma) 1996    Left/ Back  . Bladder cancer 2012    scrapped bladder and inserted liquid chemo  . HERNIORRHAPHY, HX OF 01/07/2007    Qualifier: Diagnosis of  By: Yetta Barre CMA, Chemira    . Small bowel obstruction s/p open lysis of adhesions [id number] 08/23/2011  . Hyperlipidemia     Past Surgical History  Procedure Laterality Date  . 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 Hernia repair    . Knee arthroscopy      Left knee  . Colonoscopy  07/2002    neg  . Lumbar laminectomy  2004  . Shoulder surgery  05/2008  . Laparotomy  08/23/2011    Procedure: EXPLORATORY LAPAROTOMY;  Surgeon: Mariella Saa, MD;  Location: [facility];  Service: General;  Laterality: N/A;  Lysis of Adhesions/small bowel obstruction  . Abdominal adhesion surgery  08/23/2011    Procedure: EXPLORATORY LAPAROTOMY;  Surgeon: Mariella Saa, MD;  Location: [facility];  Service:  General;  Laterality: N/A;  Lysis of Adhesions/small bowel obstruction    Family History  Problem Relation Age of Onset  . Diabetes Mother   . Heart attack Mother 51  . Heart disease Mother   . Heart attack Brother 54  . Lung cancer Brother   . Emphysema Brother   . Cancer Brother     lung  . Cancer Brother     Lymph node/neck  . Breast cancer Sister   . Cancer Sister     breast    History  Substance Use Topics  . Smoking status: Never Smoker   . Smokeless tobacco: Never Used  . Alcohol Use: No      Review of Systems  Musculoskeletal:       Positive for L hip pain.  Neurological: Negative for numbness.  All other systems reviewed and are negative.    Allergies  Review of patient's allergies indicates no known allergies.  Home Medications   Current Outpatient Rx  Name  Route  Sig  Dispense  Refill  . allopurinol (ZYLOPRIM) 100 MG tablet   Oral   Take 1 tablet (100 mg total) by mouth daily.   30 tablet   6   . aspirin 81 MG tablet   Oral   Take 81 mg by mouth daily.           Marland Kitchen  betaxolol (BETOPTIC-S) 0.25 % ophthalmic suspension   Both Eyes   Place 1 drop into both eyes 2 (two) times daily.           . carvedilol (COREG) 25 MG tablet      TAKE 1 TABLET BY MOUTH TWICE A DAY   180 tablet   1   . Cholecalciferol (VITAMIN D) 1000 UNITS capsule   Oral   Take 1,000 Units by mouth daily.           . Flaxseed, Linseed, (FLAX SEED OIL) 1000 MG CAPS   Oral   Take 1,000 mg by mouth once.           Marland Kitchen glimepiride (AMARYL) 2 MG tablet      TAKE 1 TABLET BY MOUTH DAILY BEFORE BREAKFAST.   30 tablet   5   . HYDROcodone-acetaminophen (VICODIN) 5-500 MG per tablet   Oral   Take 1-2 tablets by mouth every 6 (six) hours as needed for pain (no driving while medicated).   10 tablet   0   . indomethacin (INDOCIN) 50 MG capsule   Oral   Take 1 capsule (50 mg total) by mouth 2 (two) times daily with a meal.   30 capsule   6   . lisinopril  (PRINIVIL,ZESTRIL) 20 MG tablet   Oral   Take 1 tablet (20 mg total) by mouth daily.   90 tablet   3   . metFORMIN (GLUCOPHAGE) 500 MG tablet      TAKE 1 TABLET TWICE DAY WITH A MEAL   60 tablet   5   . NASCOBAL 500 MCG/0.1ML SOLN      USE 1 SPRAY IN ONE NOSTRIL ONCE DAILY   3.9 mL   1   . pravastatin (PRAVACHOL) 40 MG tablet      TAKE 1 TABLET BY MOUTH EVERY DAY   90 tablet   2     **LABS DUE**   . saw palmetto 160 MG capsule   Oral   Take 160 mg by mouth 2 (two) times daily.           . traMADol (ULTRAM) 50 MG tablet   Oral   Take 1 tablet (50 mg total) by mouth every 6 (six) hours as needed for pain.   30 tablet   0   . vardenafil (LEVITRA) 20 MG tablet      1 by mouth daily as needed DX: 607.84   6 tablet   5     BP 201/91  Pulse 69  Temp(Src) 98.4 F (36.9 C) (Oral)  Resp 16  SpO2 97%  Physical Exam  Nursing note and vitals reviewed. Constitutional: He is oriented to person, place, and time. He appears well-developed and well-nourished. No distress.  HENT:  Head: Normocephalic and atraumatic.  Mouth/Throat: Oropharynx is clear and moist.  Eyes: Conjunctivae are normal.  Neck: Normal range of motion. Neck supple.  Cardiovascular: Normal rate, regular rhythm, normal heart sounds and intact distal pulses.   Pulses:      Dorsalis pedis pulses are 2+ on the left side.       Posterior tibial pulses are 2+ on the left side.  Pulmonary/Chest: Effort normal and breath sounds normal. No respiratory distress.  Abdominal: Soft. Bowel sounds are normal. There is no tenderness.  Musculoskeletal: He exhibits no edema.  Left leg externally rotated and shortened. TTP of lateral aspect of proximal femur. Decreased ROM in all directions of L hip limited by  pain. Sensation intact.  Neurological: He is alert and oriented to person, place, and time. No sensory deficit.  Skin: Skin is warm, dry and intact. No bruising and no ecchymosis noted.  Psychiatric: He has  a normal mood and affect. His behavior is normal.    ED Course  Procedures (including critical care time)  Labs Reviewed - No data to display Dg Hip Complete Left  06/18/2012  *RADIOLOGY REPORT*  Clinical Data: Fall with left hip pain.  LEFT HIP - COMPLETE 2+ VIEW  Comparison: None.  Findings: There is foreshortening and abnormal rotation of the left hip relative to the right.  It is suspected that there is a probable subtle subcapital fracture.  This appears nondisplaced and not significantly impacted.  The rest of the bony pelvis is intact.  IMPRESSION: Probable subtle nondisplaced subcapital left hip fracture.   Original Report Authenticated By: Irish Lack, M.D.      1. Hip fracture, left, closed, initial encounter       MDM  67 y/o male with L hip fracture as stated above. I spoke with Dr. Shon Baton who states patient will be operated on tomorrow. Patient admitted by Dr. Gonzella Lex, triad hospitalist team 2. Hip fracture admission orders placed. Case discussed with Dr. Freida Busman who agrees with plan of care.        Trevor Mace, PA-C 06/18/12 1807

## 2012-06-18 NOTE — ED Notes (Addendum)
Pt c/o of left hip pain from fall off ladder today. Pt states he was about 14 inches off the ground and fell directly on left hip. Able to wiggle toes but moving foot up and down hurts hip, unable to move hip because of pain.

## 2012-06-18 NOTE — H&P (Addendum)
[facility] History and Physical  Donald Carlson ZOX:[id number] DOB: 05/02/1945 DOA: 06/18/2012  Referring physician: ED PCP: Marga Melnick, MD   Chief Complaint:  Follow with left hip fracture  HPI:  67 year old male with history of hypertension, diabetes and hyperlipidemia presented to the ED after falling from a ladder on a concrete surface while at work this morning and and on his left hip. He denies sustaining any other injury or loss of consciousness. Fall was purely mechanical. Denies any dizziness, headache, blurry vision, chest pain, palpitations, shortness of breath, abdominal pain, nausea, vomiting, fever, chills, bowel or urinary symptoms. In the ED vitals were stable. She had severe pain on his left hip for which an x-ray of the hip was done which showed a probable nondisplaced subcapital left fracture. Hospitalist called for admission. Ortho  Consult called from ED  Review of Systems:  Constitutional: Denies fever, chills, diaphoresis, appetite change and fatigue.  HEENT: Denies photophobia, eye pain, redness, hearing loss, ear pain, congestion, sore throat, rhinorrhea, sneezing, mouth sores, trouble swallowing, neck pain, neck stiffness and tinnitus.   Respiratory: Denies SOB, DOE, cough, chest tightness,  and wheezing.   Cardiovascular: Denies chest pain, palpitations and leg swelling.  Gastrointestinal: Denies nausea, vomiting, abdominal pain, diarrhea, constipation, blood in stool and abdominal distention.  Genitourinary: Denies dysuria, urgency, frequency, hematuria, flank pain and difficulty urinating.  Musculoskeletal: Severe pain over left hip radiating down to the leg with painful range of motion Denies myalgias, back pain, joint swelling, arthralgias  Skin: Denies pallor, rash and wound.  Neurological: Denies dizziness, seizures, syncope, weakness, light-headedness, numbness and headaches.  Hematological: Denies adenopathy. Easy bruising, personal or family  bleeding history  Psychiatric/Behavioral: Denies suicidal ideation, mood changes, confusion, nervousness, sleep disturbance and agitation   Past Medical History  Diagnosis Date  . Kidney stones   . Hypertension   . Diabetes mellitus   . Anemia   . Arthritis   . Skin cancer (melanoma) 1996    Left/ Back  . Bladder cancer 2012    scrapped bladder and inserted liquid chemo  . HERNIORRHAPHY, HX OF 01/07/2007    Qualifier: Diagnosis of  By: Yetta Barre CMA, Chemira    . Small bowel obstruction s/p open lysis of adhesions [id number] 08/23/2011  . Hyperlipidemia    Past Surgical History  Procedure Laterality Date  . 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 Hernia repair    . Knee arthroscopy      Left knee  . Colonoscopy  07/2002    neg  . Lumbar laminectomy  2004  . Shoulder surgery  05/2008  . Laparotomy  08/23/2011    Procedure: EXPLORATORY LAPAROTOMY;  Surgeon: Mariella Saa, MD;  Location: [facility];  Service: General;  Laterality: N/A;  Lysis of Adhesions/small bowel obstruction  . Abdominal adhesion surgery  08/23/2011    Procedure: EXPLORATORY LAPAROTOMY;  Surgeon: Mariella Saa, MD;  Location: [facility];  Service: General;  Laterality: N/A;  Lysis of Adhesions/small bowel obstruction   Social History:  reports that he has never smoked. He has never used smokeless tobacco. He reports that he does not drink alcohol or use illicit drugs.  No Known Allergies  Family History  Problem Relation Age of Onset  . Diabetes Mother   . Heart attack Mother 31  . Heart disease Mother   . Heart attack Brother 54  . Lung cancer Brother   . Emphysema Brother   . Cancer Brother  lung  . Cancer Brother     Lymph node/neck  . Breast cancer Sister   . Cancer Sister     breast    Prior to Admission medications   Medication Sig Start Date End Date Taking? Authorizing Provider  allopurinol (ZYLOPRIM) 100 MG tablet Take 1 tablet (100 mg total) by mouth daily. 04/16/12  Yes Pecola Lawless, MD  aspirin 81 MG tablet Take 81 mg by mouth daily.     Yes Historical Provider, MD  betaxolol (BETOPTIC-S) 0.25 % ophthalmic suspension Place 1 drop into both eyes 2 (two) times daily.     Yes Historical Provider, MD  carvedilol (COREG) 25 MG tablet Take 25 mg by mouth 2 (two) times daily with a meal.   Yes Historical Provider, MD  Cholecalciferol (VITAMIN D) 1000 UNITS capsule Take 1,000 Units by mouth daily.     Yes Historical Provider, MD  Cyanocobalamin (NASCOBAL) 500 MCG/0.1ML SOLN Place 1 mcg into the nose once a week.   Yes Historical Provider, MD  Flaxseed, Linseed, (FLAX SEED OIL) 1000 MG CAPS Take 1,000 mg by mouth once.     Yes Historical Provider, MD  glimepiride (AMARYL) 2 MG tablet Take 2 mg by mouth daily before breakfast.   Yes Historical Provider, MD  lisinopril (PRINIVIL,ZESTRIL) 20 MG tablet Take 1 tablet (20 mg total) by mouth daily. 05/10/12  Yes Pecola Lawless, MD  metFORMIN (GLUCOPHAGE) 500 MG tablet Take 500 mg by mouth 2 (two) times daily with a meal.   Yes Historical Provider, MD  pravastatin (PRAVACHOL) 40 MG tablet Take 40 mg by mouth daily.   Yes Historical Provider, MD  saw palmetto 160 MG capsule Take 160 mg by mouth 2 (two) times daily.     Yes Historical Provider, MD    Physical Exam:  Filed Vitals:   06/18/12 1631  BP: 201/91  Pulse: 69  Temp: 98.4 F (36.9 C)  TempSrc: Oral  Resp: 16  SpO2: 97%    Constitutional: Vital signs reviewed.  Patient is a well-developed and well-nourished in no acute distress and cooperative with exam. Alert and oriented x3.  Head: Normocephalic and atraumatic Ear: TM normal bilaterally Mouth: no erythema or exudates, MMM Eyes: PERRL, EOMI, conjunctivae normal, No scleral icterus.  Neck: Supple, Trachea midline normal ROM, No JVD, mass, thyromegaly, or carotid bruit present.  Cardiovascular: RRR, S1 normal, S2 normal, no MRG, pulses symmetric and intact bilaterally Pulmonary/Chest: CTAB, no wheezes, rales, or  rhonchi Abdominal: Soft. Non-tender, non-distended, bowel sounds are normal, no masses, organomegaly, or guarding present.  GU: no CVA tenderness Musculoskeletal: Left hip externally rotated with limited range of motion and tenderness to palpation, no erythema , able to move his toes and has good sensations  Ext: no edema and no cyanosis, pulses palpable bilaterally (DP and PT) Hematology: no cervical, inginal, or axillary adenopathy.  Neurological: A&O x3, Strenght is normal and symmetric bilaterally, cranial nerve II-XII are grossly intact, no focal motor deficit, sensory intact to light touch bilaterally.  Skin: Warm, dry and intact. No rash, cyanosis, or clubbing.  Psychiatric: Normal mood and affect. speech and behavior is normal. Judgment and thought content normal. Cognition and memory are normal.   Labs on Admission:  Basic Metabolic Panel: No results found for this basename: NA, K, CL, CO2, GLUCOSE, BUN, CREATININE, CALCIUM, MG, PHOS,  in the last 168 hours Liver Function Tests: No results found for this basename: AST, ALT, ALKPHOS, BILITOT, PROT, ALBUMIN,  in the last 168  hours No results found for this basename: LIPASE, AMYLASE,  in the last 168 hours No results found for this basename: AMMONIA,  in the last 168 hours CBC: No results found for this basename: WBC, NEUTROABS, HGB, HCT, MCV, PLT,  in the last 168 hours Cardiac Enzymes: No results found for this basename: CKTOTAL, CKMB, CKMBINDEX, TROPONINI,  in the last 168 hours BNP: No components found with this basename: POCBNP,  CBG: No results found for this basename: GLUCAP,  in the last 168 hours  Radiological Exams on Admission: Dg Hip Complete Left  06/18/2012  *RADIOLOGY REPORT*  Clinical Data: Fall with left hip pain.  LEFT HIP - COMPLETE 2+ VIEW  Comparison: None.  Findings: There is foreshortening and abnormal rotation of the left hip relative to the right.  It is suspected that there is a probable subtle subcapital  fracture.  This appears nondisplaced and not significantly impacted.  The rest of the bony pelvis is intact.  IMPRESSION: Probable subtle nondisplaced subcapital left hip fracture.   Original Report Authenticated By: Irish Lack, M.D.     EKG: Maisie Fus has rhythm at 64, no ST-T changes  Assessment/Plan Principal Problem:   Closed left hip fracture Admit to MedSurg. -N.p.o. except for medications and ice chips. - strict Bedrest -Pain control with when necessary morphine and Vicodin - Added Robaxin for muscle spasms -Orthopedics consult called from ED.   Active Problems: Diabetes mellitus type 2 On oral hypoglycemics. Will hold medications and place him on sliding scale insulin  Hyperlipidemia Continue statin. Aspirin  Hypertension BP elevated in the ED likely due to pain. Will place him on when necessary IV hydralazine On Coreg at home which I will continue.   EKG normal. His remaining medical problems seem stable and does not need further cardiac workup preoperatively. He He is  on a beta blocker which I will continue.  Code Status: Full code Family Communication: Wife at bedside Disposition Plan: Currently inpatient  Eddie North [facility] Pager [phone]  If 7PM-7AM, please contact night-coverage [email or web address] Password [email or web address] 06/18/2012, 6:01 PM     Total time spent on admission: 70 minutes

## 2012-06-19 ENCOUNTER — Encounter (HOSPITAL_COMMUNITY): Payer: Self-pay | Admitting: Anesthesiology

## 2012-06-19 ENCOUNTER — Inpatient Hospital Stay (HOSPITAL_COMMUNITY): Payer: Worker's Compensation | Admitting: Anesthesiology

## 2012-06-19 ENCOUNTER — Encounter (HOSPITAL_COMMUNITY)
Admission: EM | Disposition: A | Discharge status: Home Health | Payer: Self-pay | Source: Home / Self Care | Attending: Internal Medicine

## 2012-06-19 ENCOUNTER — Inpatient Hospital Stay (HOSPITAL_COMMUNITY): Payer: Worker's Compensation

## 2012-06-19 DIAGNOSIS — E119 Type 2 diabetes mellitus without complications: Secondary | ICD-10-CM

## 2012-06-19 DIAGNOSIS — E782 Mixed hyperlipidemia: Secondary | ICD-10-CM

## 2012-06-19 HISTORY — PX: HIP PINNING,CANNULATED: SHX1758

## 2012-06-19 LAB — COMPREHENSIVE METABOLIC PANEL
ALT: 54 U/L — ABNORMAL HIGH (ref 0–53)
AST: 50 U/L — ABNORMAL HIGH (ref 0–37)
Albumin: 3.2 g/dL — ABNORMAL LOW (ref 3.5–5.2)
Alkaline Phosphatase: 63 U/L (ref 39–117)
BUN: 14 mg/dL (ref 6–23)
CO2: 26 mEq/L (ref 19–32)
Calcium: 8.8 mg/dL (ref 8.4–10.5)
Chloride: 102 mEq/L (ref 96–112)
Creatinine, Ser: 0.89 mg/dL (ref 0.50–1.35)
GFR calc Af Amer: >90 mL/min (ref >90)
GFR calc non Af Amer: 87 mL/min — ABNORMAL LOW (ref >90)
Glucose, Bld: 90 mg/dL (ref 70–99)
Potassium: 4.1 mEq/L (ref 3.5–5.1)
Sodium: 135 mEq/L (ref 135–145)
Total Bilirubin: 0.6 mg/dL (ref 0.3–1.2)
Total Protein: 6.2 g/dL (ref 6.0–8.3)

## 2012-06-19 LAB — CBC
HCT: 37.1 % — ABNORMAL LOW (ref 39.0–52.0)
HCT: 36.8 % — ABNORMAL LOW (ref 39.0–52.0)
Hemoglobin: 12.4 g/dL — ABNORMAL LOW (ref 13.0–17.0)
Hemoglobin: 12.4 g/dL — ABNORMAL LOW (ref 13.0–17.0)
MCH: 28.4 pg (ref 26.0–34.0)
MCH: 28.8 pg (ref 26.0–34.0)
MCHC: 33.4 g/dL (ref 30.0–36.0)
MCHC: 33.7 g/dL (ref 30.0–36.0)
MCV: 85.1 fL (ref 78.0–100.0)
MCV: 85.6 fL (ref 78.0–100.0)
Platelets: 157 K/uL (ref 150–400)
Platelets: 146 10*3/uL — ABNORMAL LOW (ref 150–400)
RBC: 4.36 MIL/uL (ref 4.22–5.81)
RBC: 4.3 MIL/uL (ref 4.22–5.81)
RDW: 13.7 % (ref 11.5–15.5)
RDW: 13.6 % (ref 11.5–15.5)
WBC: 7.3 K/uL (ref 4.0–10.5)
WBC: 6.5 10*3/uL (ref 4.0–10.5)

## 2012-06-19 LAB — PROTIME-INR
INR: 1.04 (ref 0.00–1.49)
Prothrombin Time: 13.5 s (ref 11.6–15.2)

## 2012-06-19 LAB — GLUCOSE, CAPILLARY
Glucose-Capillary: 71 mg/dL (ref 70–99)
Glucose-Capillary: 86 mg/dL (ref 70–99)
Glucose-Capillary: 85 mg/dL (ref 70–99)
Glucose-Capillary: 103 mg/dL — ABNORMAL HIGH (ref 70–99)
Glucose-Capillary: 105 mg/dL — ABNORMAL HIGH (ref 70–99)
Glucose-Capillary: 95 mg/dL (ref 70–99)

## 2012-06-19 LAB — URINALYSIS, ROUTINE W REFLEX MICROSCOPIC
Bilirubin Urine: NEGATIVE
Glucose, UA: NEGATIVE mg/dL
Hgb urine dipstick: NEGATIVE
Ketones, ur: NEGATIVE mg/dL
Leukocytes, UA: NEGATIVE
Nitrite: NEGATIVE
Protein, ur: NEGATIVE mg/dL
Specific Gravity, Urine: 1.025 (ref 1.005–1.030)
Urobilinogen, UA: 0.2 mg/dL (ref 0.0–1.0)
pH: 6.5 (ref 5.0–8.0)

## 2012-06-19 LAB — BASIC METABOLIC PANEL WITH GFR
BUN: 17 mg/dL (ref 6–23)
CO2: 28 meq/L (ref 19–32)
Calcium: 9.1 mg/dL (ref 8.4–10.5)
Chloride: 103 meq/L (ref 96–112)
Creatinine, Ser: 1.01 mg/dL (ref 0.50–1.35)
GFR calc Af Amer: 87 mL/min — ABNORMAL LOW
GFR calc non Af Amer: 75 mL/min — ABNORMAL LOW
Glucose, Bld: 86 mg/dL (ref 70–99)
Potassium: 3.9 meq/L (ref 3.5–5.1)
Sodium: 137 meq/L (ref 135–145)

## 2012-06-19 SURGERY — FIXATION, FEMUR, NECK, PERCUTANEOUS, USING SCREW
Anesthesia: General | Site: Hip | Laterality: Left | Wound class: Clean

## 2012-06-19 MED ORDER — ENOXAPARIN SODIUM 40 MG/0.4ML ~~LOC~~ SOLN
40.0000 mg | SUBCUTANEOUS | Status: DC
  Administered 2012-06-20 – 2012-06-21 (×2): 40 mg via SUBCUTANEOUS
  Filled 2012-06-19 (×4): qty 0.4

## 2012-06-19 MED ORDER — FENTANYL CITRATE 0.05 MG/ML IJ SOLN
INTRAMUSCULAR | Status: DC | PRN
  Administered 2012-06-19 (×2): 50 ug via INTRAVENOUS

## 2012-06-19 MED ORDER — LACTATED RINGERS IV SOLN: INTRAVENOUS | Status: DC

## 2012-06-19 MED ORDER — ACETAMINOPHEN 650 MG RE SUPP: 650.0000 mg | Freq: Four times a day (QID) | RECTAL | Status: DC | PRN

## 2012-06-19 MED ORDER — SUCCINYLCHOLINE CHLORIDE 20 MG/ML IJ SOLN
INTRAMUSCULAR | Status: DC | PRN
  Administered 2012-06-19: 130 mg via INTRAVENOUS

## 2012-06-19 MED ORDER — ONDANSETRON HCL 4 MG PO TABS
4.0000 mg | ORAL_TABLET | Freq: Four times a day (QID) | ORAL | Status: DC | PRN
  Administered 2012-06-20: 4 mg via ORAL
  Filled 2012-06-19: qty 1

## 2012-06-19 MED ORDER — METOCLOPRAMIDE HCL 5 MG/ML IJ SOLN: 5.0000 mg | Freq: Three times a day (TID) | INTRAMUSCULAR | Status: DC | PRN

## 2012-06-19 MED ORDER — CISATRACURIUM BESYLATE (PF) 10 MG/5ML IV SOLN
INTRAVENOUS | Status: DC | PRN
  Administered 2012-06-19: 10 mg via INTRAVENOUS

## 2012-06-19 MED ORDER — INSULIN ASPART 100 UNIT/ML ~~LOC~~ SOLN: 0.0000 [IU] | Freq: Three times a day (TID) | SUBCUTANEOUS | Status: DC

## 2012-06-19 MED ORDER — ONDANSETRON HCL 4 MG/2ML IJ SOLN
INTRAMUSCULAR | Status: DC | PRN
  Administered 2012-06-19 (×2): 2 mg via INTRAVENOUS

## 2012-06-19 MED ORDER — HYDROCODONE-ACETAMINOPHEN 7.5-325 MG PO TABS: 1.0000 | ORAL_TABLET | ORAL | Status: DC | PRN

## 2012-06-19 MED ORDER — ACETAMINOPHEN 325 MG PO TABS: 650.0000 mg | ORAL_TABLET | Freq: Four times a day (QID) | ORAL | Status: DC | PRN

## 2012-06-19 MED ORDER — CEFAZOLIN SODIUM-DEXTROSE 2-3 GM-% IV SOLR
INTRAVENOUS | Status: AC
  Filled 2012-06-19: qty 50

## 2012-06-19 MED ORDER — CLINDAMYCIN PHOSPHATE 900 MG/50ML IV SOLN
900.0000 mg | Freq: Three times a day (TID) | INTRAVENOUS | Status: AC
  Administered 2012-06-19 – 2012-06-20 (×3): 900 mg via INTRAVENOUS
  Filled 2012-06-19 (×3): qty 50

## 2012-06-19 MED ORDER — CLINDAMYCIN PHOSPHATE 600 MG/50ML IV SOLN: 600.0000 mg | Freq: Three times a day (TID) | INTRAVENOUS | Status: DC

## 2012-06-19 MED ORDER — CEFAZOLIN SODIUM-DEXTROSE 2-3 GM-% IV SOLR: 2.0000 g | Freq: Once | INTRAVENOUS | Status: DC

## 2012-06-19 MED ORDER — PHENOL 1.4 % MT LIQD
1.0000 | OROMUCOSAL | Status: DC | PRN
  Filled 2012-06-19: qty 177

## 2012-06-19 MED ORDER — ASCRIPTIN 325 MG PO TABS: 325.0000 | ORAL_TABLET | Freq: Two times a day (BID) | ORAL | Status: DC

## 2012-06-19 MED ORDER — LACTATED RINGERS IV SOLN
INTRAVENOUS | Status: DC | PRN
  Administered 2012-06-19 (×2): via INTRAVENOUS

## 2012-06-19 MED ORDER — ONDANSETRON HCL 4 MG/2ML IJ SOLN: 4.0000 mg | Freq: Four times a day (QID) | INTRAMUSCULAR | Status: DC | PRN

## 2012-06-19 MED ORDER — NEOSTIGMINE METHYLSULFATE 1 MG/ML IJ SOLN
INTRAMUSCULAR | Status: DC | PRN
  Administered 2012-06-19: 4 mg via INTRAVENOUS

## 2012-06-19 MED ORDER — ACETAMINOPHEN 10 MG/ML IV SOLN
INTRAVENOUS | Status: DC | PRN
  Administered 2012-06-19: 1000 mg via INTRAVENOUS

## 2012-06-19 MED ORDER — HYDROMORPHONE HCL PF 1 MG/ML IJ SOLN
INTRAMUSCULAR | Status: AC
  Filled 2012-06-19: qty 1

## 2012-06-19 MED ORDER — CLINDAMYCIN PHOSPHATE 900 MG/50ML IV SOLN
INTRAVENOUS | Status: AC
  Filled 2012-06-19: qty 50

## 2012-06-19 MED ORDER — HYDROMORPHONE HCL PF 1 MG/ML IJ SOLN
0.2500 mg | INTRAMUSCULAR | Status: DC | PRN
  Administered 2012-06-19: 0.5 mg via INTRAVENOUS

## 2012-06-19 MED ORDER — HYDROCODONE-ACETAMINOPHEN 5-325 MG PO TABS: 1.0000 | ORAL_TABLET | Freq: Four times a day (QID) | ORAL | Status: DC | PRN

## 2012-06-19 MED ORDER — EPHEDRINE SULFATE 50 MG/ML IJ SOLN
INTRAMUSCULAR | Status: DC | PRN
  Administered 2012-06-19 (×3): 10 mg via INTRAVENOUS

## 2012-06-19 MED ORDER — GLYCOPYRROLATE 0.2 MG/ML IJ SOLN
INTRAMUSCULAR | Status: DC | PRN
  Administered 2012-06-19: 0.2 mg via INTRAVENOUS

## 2012-06-19 MED ORDER — PANTOPRAZOLE SODIUM 40 MG IV SOLR
40.0000 mg | Freq: Every day | INTRAVENOUS | Status: DC
  Administered 2012-06-19: 40 mg via INTRAVENOUS
  Filled 2012-06-19 (×2): qty 40

## 2012-06-19 MED ORDER — POTASSIUM CHLORIDE IN NACL 20-0.9 MEQ/L-% IV SOLN
INTRAVENOUS | Status: AC
  Administered 2012-06-19 – 2012-06-20 (×2): via INTRAVENOUS
  Filled 2012-06-19 (×3): qty 1000

## 2012-06-19 MED ORDER — CEFAZOLIN SODIUM-DEXTROSE 2-3 GM-% IV SOLR
2.0000 g | Freq: Four times a day (QID) | INTRAVENOUS | Status: AC
  Administered 2012-06-19 – 2012-06-20 (×2): 2 g via INTRAVENOUS
  Filled 2012-06-19 (×3): qty 50

## 2012-06-19 MED ORDER — MENTHOL 3 MG MT LOZG
1.0000 | LOZENGE | OROMUCOSAL | Status: DC | PRN
  Filled 2012-06-19: qty 9

## 2012-06-19 MED ORDER — DEXTROSE 5 % IV SOLN: 3.0000 g | INTRAVENOUS | Status: DC

## 2012-06-19 MED ORDER — 0.9 % SODIUM CHLORIDE (POUR BTL) OPTIME
TOPICAL | Status: DC | PRN
  Administered 2012-06-19: 1000 mL

## 2012-06-19 MED ORDER — MORPHINE SULFATE 2 MG/ML IJ SOLN: 0.5000 mg | INTRAMUSCULAR | Status: DC | PRN

## 2012-06-19 MED ORDER — MIDAZOLAM HCL 5 MG/5ML IJ SOLN
INTRAMUSCULAR | Status: DC | PRN
  Administered 2012-06-19 (×2): 1 mg via INTRAVENOUS

## 2012-06-19 MED ORDER — PROPOFOL 10 MG/ML IV EMUL
INTRAVENOUS | Status: DC | PRN
  Administered 2012-06-19: 200 mg via INTRAVENOUS

## 2012-06-19 MED ORDER — ACETAMINOPHEN 10 MG/ML IV SOLN
INTRAVENOUS | Status: AC
  Filled 2012-06-19: qty 100

## 2012-06-19 MED ORDER — CEFAZOLIN SODIUM-DEXTROSE 2-3 GM-% IV SOLR
INTRAVENOUS | Status: DC | PRN
  Administered 2012-06-19: 2 g via INTRAVENOUS

## 2012-06-19 MED ORDER — METOCLOPRAMIDE HCL 10 MG PO TABS: 5.0000 mg | ORAL_TABLET | Freq: Three times a day (TID) | ORAL | Status: DC | PRN

## 2012-06-19 MED ORDER — LACTATED RINGERS IV SOLN
INTRAVENOUS | Status: DC
  Administered 2012-06-19: 11:00:00 via INTRAVENOUS

## 2012-06-19 MED ORDER — LIDOCAINE HCL (CARDIAC) 20 MG/ML IV SOLN
INTRAVENOUS | Status: DC | PRN
  Administered 2012-06-19: 30 mg via INTRAVENOUS

## 2012-06-19 SURGICAL SUPPLY — 42 items
BAG SPEC THK2 15X12 ZIP CLS (MISCELLANEOUS) ×1
BAG ZIPLOCK 12X15 (MISCELLANEOUS) ×2 IMPLANT
BANDAGE GAUZE ELAST BULKY 4 IN (GAUZE/BANDAGES/DRESSINGS) ×2 IMPLANT
BIT DRILL 5 ACE CANN QC (BIT) ×1 IMPLANT
CHLORAPREP W/TINT 26ML (MISCELLANEOUS) IMPLANT
CLOTH BEACON ORANGE TIMEOUT ST (SAFETY) ×2 IMPLANT
DRSG AQUACEL AG ADV 3.5X 6 (GAUZE/BANDAGES/DRESSINGS) ×1 IMPLANT
DRSG EMULSION OIL 3X16 NADH (GAUZE/BANDAGES/DRESSINGS) ×1 IMPLANT
DRSG PAD ABDOMINAL 8X10 ST (GAUZE/BANDAGES/DRESSINGS) ×1 IMPLANT
ELECT REM PT RETURN 9FT ADLT (ELECTROSURGICAL) ×2
ELECTRODE REM PT RTRN 9FT ADLT (ELECTROSURGICAL) ×1 IMPLANT
GLOVE BIOGEL PI IND STRL 7.5 (GLOVE) ×1 IMPLANT
GLOVE BIOGEL PI IND STRL 8 (GLOVE) ×1 IMPLANT
GLOVE BIOGEL PI INDICATOR 7.5 (GLOVE) ×1
GLOVE BIOGEL PI INDICATOR 8 (GLOVE) ×1
GLOVE SURG SS PI 7.5 STRL IVOR (GLOVE) ×2 IMPLANT
GLOVE SURG SS PI 8.0 STRL IVOR (GLOVE) ×4 IMPLANT
GOWN PREVENTION PLUS LG XLONG (DISPOSABLE) ×2 IMPLANT
GOWN PREVENTION PLUS XLARGE (GOWN DISPOSABLE) ×1 IMPLANT
GOWN STRL REIN XL XLG (GOWN DISPOSABLE) ×4 IMPLANT
KIT BASIN OR (CUSTOM PROCEDURE TRAY) ×2 IMPLANT
MANIFOLD NEPTUNE II (INSTRUMENTS) ×2 IMPLANT
PACK GENERAL/GYN (CUSTOM PROCEDURE TRAY) ×2 IMPLANT
PAD CAST 4YDX4 CTTN HI CHSV (CAST SUPPLIES) ×1 IMPLANT
PADDING CAST COTTON 4X4 STRL (CAST SUPPLIES) ×2
PIN THREADED GUIDE ACE (PIN) ×3 IMPLANT
POSITIONER SURGICAL ARM (MISCELLANEOUS) ×2 IMPLANT
SCREW CANN 22X6.5X100 (Screw) IMPLANT
SCREW CANN 6.5 100MM (Screw) ×2 IMPLANT
SCREW CANN 6.5 90MM (Screw) ×2 IMPLANT
SCREW CANN 6.5 95MM (Screw) ×4 IMPLANT
SCREW CANN LG 6.5 FLT 90X22 (Screw) IMPLANT
SCREW CANN LG 6.5 FLT 95X22 (Screw) IMPLANT
SPONGE GAUZE 4X4 12PLY (GAUZE/BANDAGES/DRESSINGS) ×1 IMPLANT
SPONGE LAP 18X18 X RAY DECT (DISPOSABLE) IMPLANT
STAPLER VISISTAT 35W (STAPLE) ×2 IMPLANT
SUT VIC AB 1 CT1 27 (SUTURE) ×4
SUT VIC AB 1 CT1 27XBRD ANTBC (SUTURE) ×2 IMPLANT
SUT VIC AB 2-0 CT1 27 (SUTURE) ×4
SUT VIC AB 2-0 CT1 27XBRD (SUTURE) ×2 IMPLANT
TRAY FOLEY CATH 14FRSI W/METER (CATHETERS) ×1 IMPLANT
WATER STERILE IRR 1500ML POUR (IV SOLUTION) ×2 IMPLANT

## 2012-06-19 NOTE — H&P (View-Only) (Signed)
William Hopper, MD Chief Complaint: S/p fall with left hip pain. History: 67-year-old male with history of hypertension, diabetes and hyperlipidemia presented to the ED after falling from a ladder on a concrete surface while at work this morning and and on his left hip. He denies sustaining any other injury or loss of consciousness. Fall was purely mechanical. Denies any dizziness, headache, blurry vision, chest pain, palpitations, shortness of breath, abdominal pain, nausea, vomiting, fever, chills, bowel or urinary symptoms.  In the ED vitals were stable. She had severe pain on his left hip for which an x-ray of the hip was done which showed a probable nondisplaced subcapital left fracture. Hospitalist called for admission. Ortho Consult called from ED  Past Medical History  Diagnosis Date  . Kidney stones   . Hypertension   . Diabetes mellitus   . Anemia   . Arthritis   . Skin cancer (melanoma) 1996    Left/ Back  . Bladder cancer 2012    scrapped bladder and inserted liquid chemo  . HERNIORRHAPHY, HX OF 01/07/2007    Qualifier: Diagnosis of  By: Jones CMA, Chemira    . Small bowel obstruction s/p open lysis of adhesions Jun2013 08/23/2011  . Hyperlipidemia     No Known Allergies  No current facility-administered medications on file prior to encounter.   Current Outpatient Prescriptions on File Prior to Encounter  Medication Sig Dispense Refill  . allopurinol (ZYLOPRIM) 100 MG tablet Take 1 tablet (100 mg total) by mouth daily.  30 tablet  6  . aspirin 81 MG tablet Take 81 mg by mouth daily.        . betaxolol (BETOPTIC-S) 0.25 % ophthalmic suspension Place 1 drop into both eyes 2 (two) times daily.        . Cholecalciferol (VITAMIN D) 1000 UNITS capsule Take 1,000 Units by mouth daily.        . Flaxseed, Linseed, (FLAX SEED OIL) 1000 MG CAPS Take 1,000 mg by mouth once.        . lisinopril (PRINIVIL,ZESTRIL) 20 MG tablet Take 1 tablet (20 mg total) by mouth daily.  90 tablet  3   . saw palmetto 160 MG capsule Take 160 mg by mouth 2 (two) times daily.          Physical Exam: A+O X3 NVI - 2+ DP/PT pulses EHL/TA/GA intact Compartments soft/nt L hip/groin pain  No knee/.ankle pain No SOB/CP Abd soft/NT  Filed Vitals:   06/18/12 2058  BP: 170/75  Pulse: 90  Temp: 98 F (36.7 C)  Resp:     Image: Dg Hip Complete Left  06/18/2012  *RADIOLOGY REPORT*  Clinical Data: Fall with left hip pain.  LEFT HIP - COMPLETE 2+ VIEW  Comparison: None.  Findings: There is foreshortening and abnormal rotation of the left hip relative to the right.  It is suspected that there is a probable subtle subcapital fracture.  This appears nondisplaced and not significantly impacted.  The rest of the bony pelvis is intact.  IMPRESSION: Probable subtle nondisplaced subcapital left hip fracture.   Original Report Authenticated By: Glenn Yamagata, M.D.    Dg Chest Portable 1 View  06/18/2012  *RADIOLOGY REPORT*  Clinical Data: Preop, left hip injury  PORTABLE CHEST - 1 VIEW  Comparison: 01/03/2011  Findings: Chronic interstitial markings.  No focal consolidation. No pleural effusion or pneumothorax.  The heart is normal in size.  IMPRESSION: No evidence of acute cardiopulmonary disease.   Original Report Authenticated By: Sriyesh Krishnan,   M.D.     A/P:  Patient s/p fall from ladder with left hip pain and inability to weight bear. Xrays demonstrate left femoral neck fracture - non-displaced Case discussed with Dr Beane - will plan on definitive fracture management tomorrow Patient and wife's questions were addressed Consent for CRPP vs Hemiarthroplasty  Admit per hip fracture protocal  

## 2012-06-19 NOTE — Interval H&P Note (Signed)
History and Physical Interval Note:  06/19/2012 11:15 AM  Donald Carlson  has presented today for surgery, with the diagnosis of fractured left hip  The various methods of treatment have been discussed with the patient and family. After consideration of risks, benefits and other options for treatment, the patient has consented to  Procedure(s): CANNULATED HIP PINNING (Left) as a surgical intervention .  The patient's history has been reviewed, patient examined, no change in status, stable for surgery.  I have reviewed the patient's chart and labs.  Questions were answered to the patient's satisfaction.     [name]   

## 2012-06-19 NOTE — Anesthesia Postprocedure Evaluation (Signed)
  Anesthesia Post-op Note  Patient: Donald Carlson  Procedure(s) Performed: Procedure(s) (LRB): CANNULATED HIP PINNING (Left)  Patient Location: PACU  Anesthesia Type: General  Level of Consciousness: awake and alert   Airway and Oxygen Therapy: Patient Spontanous Breathing  Post-op Pain: mild  Post-op Assessment: Post-op Vital signs reviewed, Patient's Cardiovascular Status Stable, Respiratory Function Stable, Patent Airway and No signs of Nausea or vomiting  Last Vitals:  Filed Vitals:   06/19/12 1345  BP: 145/74  Pulse: 72  Temp:   Resp: 17    Post-op Vital Signs: stable   Complications: No apparent anesthesia complications

## 2012-06-19 NOTE — Progress Notes (Signed)
Patient ID: Donald Carlson, male   DOB: 06/08/1945, 66 y.o.   MRN: [id number] Discussed ORIF with patient including risks and benefits.Plan this AM. 

## 2012-06-19 NOTE — Progress Notes (Signed)
[facility] PROGRESS NOTE  Donald Carlson XBJ:[id number] DOB: 1945/04/26 DOA: 06/18/2012 PCP: Marga Melnick, MD  Assessment/Plan: Left subcapital hip fracture -Surgery planned for 06/19/12 -Pain control -Appreciate orthopedic consult -PT OT after surgery -Patient is medically stable for surgery Diabetes mellitus type 2 -Hemoglobin A1c 6.4 on 04/26/2012 -Continue NovoLog sliding scale while in the hospital -Hold metformin and Amaryl Hypertension -Continue carvedilol and lisinopril -Elevated blood pressure partly due to pain Hyperlipidemia -Continue statin    Family Communication:   wife at beside Disposition Plan:   Home when medically stable      Procedures/Studies: Dg Hip Complete Left  06/18/2012  *RADIOLOGY REPORT*  Clinical Data: Fall with left hip pain.  LEFT HIP - COMPLETE 2+ VIEW  Comparison: None.  Findings: There is foreshortening and abnormal rotation of the left hip relative to the right.  It is suspected that there is a probable subtle subcapital fracture.  This appears nondisplaced and not significantly impacted.  The rest of the bony pelvis is intact.  IMPRESSION: Probable subtle nondisplaced subcapital left hip fracture.   Original Report Authenticated By: Irish Lack, M.D.    Dg Chest Portable 1 View  06/18/2012  *RADIOLOGY REPORT*  Clinical Data: Preop, left hip injury  PORTABLE CHEST - 1 VIEW  Comparison: 01/03/2011  Findings: Chronic interstitial markings.  No focal consolidation. No pleural effusion or pneumothorax.  The heart is normal in size.  IMPRESSION: No evidence of acute cardiopulmonary disease.   Original Report Authenticated By: Charline Bills, M.D.          Subjective: Patient denies fevers, chills, chest pain, shortness breath, nausea, vomiting, diarrhea. No dysuria or hematuria. No dizziness. No headache.  Objective: Filed Vitals:   06/19/12 0048 06/19/12 0215 06/19/12 0401 06/19/12 0445  BP:    174/96  Pulse:    69  Temp:     98.5 F (36.9 C)  TempSrc:      Resp: 16  16 16   Height:      Weight:      SpO2: 93% 94%  96%    Intake/Output Summary (Last 24 hours) at 06/19/12 0810 Last data filed at 06/19/12 0446  Gross per 24 hour  Intake    850 ml  Output    550 ml  Net    300 ml   Weight change:  Exam:   General:  Pt is alert, follows commands appropriately, not in acute distress  HEENT: No icterus, No thrush,  [place]/AT  Cardiovascular: RRR, S1/S2, no rubs, no gallops  Respiratory: CTA bilaterally, no wheezing, no crackles, no rhonchi  Abdomen: Soft/+BS, non tender, non distended, no guarding  Extremities: No edema, No lymphangitis, No petechiae, No rashes, no synovitis  Data Reviewed: Basic Metabolic Panel:  Recent Labs Lab 06/18/12 1805 06/19/12 0350  NA 140 137  K 4.3 3.9  CL 105 103  CO2 26 28  GLUCOSE 76 86  BUN 20 17  CREATININE 1.12 1.01  CALCIUM 9.6 9.1   Liver Function Tests: No results found for this basename: AST, ALT, ALKPHOS, BILITOT, PROT, ALBUMIN,  in the last 168 hours No results found for this basename: LIPASE, AMYLASE,  in the last 168 hours No results found for this basename: AMMONIA,  in the last 168 hours CBC:  Recent Labs Lab 06/18/12 1805 06/19/12 0350  WBC 11.2* 7.3  HGB 12.6* 12.4*  HCT 38.2* 37.1*  MCV 85.3 85.1  PLT 184 157   Cardiac Enzymes: No results found for this basename: CKTOTAL,  CKMB, CKMBINDEX, TROPONINI,  in the last 168 hours BNP: No components found with this basename: POCBNP,  CBG:  Recent Labs Lab 06/18/12 1951 06/18/12 2055 06/18/12 2354 06/19/12 0412  GLUCAP 69* 130* 137* 71    No results found for this or any previous visit (from the past 240 hour(s)).   Scheduled Meds: . allopurinol  100 mg Oral Daily  . betaxolol  1 drop Both Eyes BID  . carvedilol  25 mg Oral BID WC  . docusate sodium  100 mg Oral BID  . enoxaparin (LOVENOX) injection  50 mg Subcutaneous Q24H  . insulin aspart  0-15 Units Subcutaneous Q4H  .  lisinopril  20 mg Oral Daily  . pantoprazole (PROTONIX) IV  40 mg Intravenous Daily  . simvastatin  20 mg Oral q1800   Continuous Infusions: . sodium chloride 75 mL/hr at 06/18/12 1949     Mackensie Pilson, DO  [facility] Pager [phone]  If 7PM-7AM, please contact night-coverage [email or web address] Password [email or web address] 06/19/2012, 8:10 AM   LOS: 1 day

## 2012-06-19 NOTE — ED Provider Notes (Signed)
Medical screening examination/treatment/procedure(s) were performed by non-physician practitioner and as supervising physician I was immediately available for consultation/collaboration.  Kalista Laguardia T Yari Szeliga, MD 06/19/12 1710 

## 2012-06-19 NOTE — Brief Op Note (Signed)
06/18/2012 - 06/19/2012  1:17 PM  PATIENT:  Donald Carlson  67 y.o. male  PRE-OPERATIVE DIAGNOSIS:  fractured left hip  POST-OPERATIVE DIAGNOSIS:  fractured left hip  PROCEDURE:  Procedure(s): CANNULATED HIP PINNING (Left)  SURGEON:  Surgeon(s) and Role:    * Javier Docker, MD - Primary  PHYSICIAN ASSISTANT:   ASSISTANTS: Bissell   ANESTHESIA:   general  EBL:  Total I/O In: 1000 [I.V.:1000] Out: -   BLOOD ADMINISTERED:none  DRAINS: none   LOCAL MEDICATIONS USED:  MARCAINE     SPECIMEN:  No Specimen  DISPOSITION OF SPECIMEN:  N/A  COUNTS:  YES  TOURNIQUET:  * No tourniquets in log *  DICTATION: .Other Dictation: Dictation Number [id number]  PLAN OF CARE: Admit to inpatient   PATIENT DISPOSITION:  PACU - hemodynamically stable.   Delay start of Pharmacological VTE agent (>24hrs) due to surgical blood loss or risk of bleeding: no

## 2012-06-19 NOTE — Interval H&P Note (Signed)
History and Physical Interval Note:  06/19/2012 11:15 AM  Edi L Lira  has presented today for surgery, with the diagnosis of fractured left hip  The various methods of treatment have been discussed with the patient and family. After consideration of risks, benefits and other options for treatment, the patient has consented to  Procedure(s): CANNULATED HIP PINNING (Left) as a surgical intervention .  The patient's history has been reviewed, patient examined, no change in status, stable for surgery.  I have reviewed the patient's chart and labs.  Questions were answered to the patient's satisfaction.     Hara Milholland C   

## 2012-06-19 NOTE — Anesthesia Preprocedure Evaluation (Addendum)
Anesthesia Evaluation  Patient identified by MRN, date of birth, ID band Patient awake    Reviewed: Allergy & Precautions, H&P , NPO status , Patient's Chart, lab work & pertinent test results, reviewed documented beta blocker date and time   Airway Mallampati: II TM Distance: >3 FB Neck ROM: full    Dental  (+) Edentulous Upper, Edentulous Lower and Dental Advisory Given   Pulmonary neg pulmonary ROS,  breath sounds clear to auscultation  Pulmonary exam normal       Cardiovascular hypertension, Pt. on home beta blockers Rhythm:regular Rate:Normal     Neuro/Psych negative neurological ROS  negative psych ROS   GI/Hepatic negative GI ROS, Neg liver ROS,   Endo/Other  diabetes, Well Controlled, Type 2, Oral Hypoglycemic Agents  Renal/GU negative Renal ROS  negative genitourinary   Musculoskeletal   Abdominal   Peds  Hematology negative hematology ROS (+)   Anesthesia Other Findings   Reproductive/Obstetrics negative OB ROS                          Anesthesia Physical Anesthesia Plan  ASA: III  Anesthesia Plan: General   Post-op Pain Management:    Induction: Intravenous  Airway Management Planned: Oral ETT  Additional Equipment:   Intra-op Plan:   Post-operative Plan: Extubation in OR  Informed Consent: I have reviewed the patients History and Physical, chart, labs and discussed the procedure including the risks, benefits and alternatives for the proposed anesthesia with the patient or authorized representative who has indicated his/her understanding and acceptance.   Dental Advisory Given  Plan Discussed with: CRNA and Surgeon  Anesthesia Plan Comments:         Anesthesia Quick Evaluation

## 2012-06-19 NOTE — Transfer of Care (Signed)
Immediate Anesthesia Transfer of Care Note  Patient: Donald Carlson  Procedure(s) Performed: Procedure(s): CANNULATED HIP PINNING (Left)  Patient Location: PACU  Anesthesia Type:General  Level of Consciousness: awake and alert   Airway & Oxygen Therapy: Patient Spontanous Breathing and Patient connected to face mask oxygen  Post-op Assessment: Report given to PACU RN and Post -op Vital signs reviewed and stable  Post vital signs: Reviewed and stable  Complications: No apparent anesthesia complications

## 2012-06-19 NOTE — Interval H&P Note (Signed)
History and Physical Interval Note:  06/19/2012 11:15 AM  Donald Carlson  has presented today for surgery, with the diagnosis of fractured left hip  The various methods of treatment have been discussed with the patient and family. After consideration of risks, benefits and other options for treatment, the patient has consented to  Procedure(s): CANNULATED HIP PINNING (Left) as a surgical intervention .  The patient's history has been reviewed, patient examined, no change in status, stable for surgery.  I have reviewed the patient's chart and labs.  Questions were answered to the patient's satisfaction.     Rayden Scheper C

## 2012-06-19 NOTE — H&P (View-Only) (Signed)
Patient ID: Donald Carlson, male   DOB: [birth year], 67 y.o.   MRN: [id number] Discussed ORIF with patient including risks and benefits.Plan this AM.

## 2012-06-19 NOTE — Progress Notes (Signed)
UR completed 

## 2012-06-20 ENCOUNTER — Encounter (HOSPITAL_COMMUNITY): Payer: Self-pay | Admitting: Specialist

## 2012-06-20 DIAGNOSIS — S72009D Fracture of unspecified part of neck of unspecified femur, subsequent encounter for closed fracture with routine healing: Secondary | ICD-10-CM

## 2012-06-20 LAB — CBC
HCT: 37.5 % — ABNORMAL LOW (ref 39.0–52.0)
Hemoglobin: 12.5 g/dL — ABNORMAL LOW (ref 13.0–17.0)
MCH: 28.5 pg (ref 26.0–34.0)
MCHC: 33.3 g/dL (ref 30.0–36.0)
MCV: 85.6 fL (ref 78.0–100.0)
Platelets: 141 10*3/uL — ABNORMAL LOW (ref 150–400)
RBC: 4.38 MIL/uL (ref 4.22–5.81)
RDW: 13.7 % (ref 11.5–15.5)
WBC: 9.1 10*3/uL (ref 4.0–10.5)

## 2012-06-20 LAB — BASIC METABOLIC PANEL
BUN: 12 mg/dL (ref 6–23)
CO2: 27 mEq/L (ref 19–32)
Calcium: 8.7 mg/dL (ref 8.4–10.5)
Chloride: 100 mEq/L (ref 96–112)
Creatinine, Ser: 0.9 mg/dL (ref 0.50–1.35)
GFR calc Af Amer: >90 mL/min (ref >90)
GFR calc non Af Amer: 86 mL/min — ABNORMAL LOW (ref >90)
Glucose, Bld: 108 mg/dL — ABNORMAL HIGH (ref 70–99)
Potassium: 4 mEq/L (ref 3.5–5.1)
Sodium: 134 mEq/L — ABNORMAL LOW (ref 135–145)

## 2012-06-20 LAB — GLUCOSE, CAPILLARY
Glucose-Capillary: 81 mg/dL (ref 70–99)
Glucose-Capillary: 91 mg/dL (ref 70–99)
Glucose-Capillary: 100 mg/dL — ABNORMAL HIGH (ref 70–99)
Glucose-Capillary: 85 mg/dL (ref 70–99)
Glucose-Capillary: 100 mg/dL — ABNORMAL HIGH (ref 70–99)

## 2012-06-20 MED ORDER — PANTOPRAZOLE SODIUM 40 MG PO TBEC
40.0000 mg | DELAYED_RELEASE_TABLET | Freq: Every day | ORAL | Status: DC
  Administered 2012-06-20 – 2012-06-21 (×2): 40 mg via ORAL
  Filled 2012-06-20 (×2): qty 1

## 2012-06-20 MED ORDER — METHOCARBAMOL 500 MG PO TABS
500.0000 mg | ORAL_TABLET | Freq: Four times a day (QID) | ORAL | Status: DC | PRN
  Administered 2012-06-20 – 2012-06-21 (×4): 500 mg via ORAL
  Filled 2012-06-20 (×4): qty 1

## 2012-06-20 MED ORDER — POLYETHYLENE GLYCOL 3350 17 G PO PACK
17.0000 g | PACK | Freq: Every day | ORAL | Status: DC
  Administered 2012-06-20 – 2012-06-21 (×2): 17 g via ORAL

## 2012-06-20 NOTE — Op Note (Signed)
Donald Carlson, Donald Carlson NO.:  [id number]  MEDICAL RECORD NO.:  [id number]  LOCATION:  1611                         FACILITY:  [facility]  PHYSICIAN:  Jene Every, M.D.    DATE OF BIRTH:  Aug 03, 1945  DATE OF PROCEDURE: DATE OF DISCHARGE:                              OPERATIVE REPORT   PREOPERATIVE DIAGNOSIS:  Femoral neck fracture, nondisplaced, left.  POSTOPERATIVE DIAGNOSIS:  Femoral neck fracture, nondisplaced, left.  PROCEDURE PERFORMED:  Open reduction and internal fixation, left.  ANESTHESIA:  General.  SURGEON:  Jene Every, M.D.  ASSISTANT:  Lanna Poche, PA for positioning and soft tissue retraction, placement of pins.  HISTORY:  This 67 year old fell yesterday, nondisplaced fracture, seen by Dr. Shon Baton, referred here for definitive operation, indicated for cannulated screws.  Risk and benefits discussed including bleeding, infection, DVT, PE, nonunion, malunion, need for revision, total hip, etc.  TECHNIQUE:  Placed in supine position.  After induction of adequate general anesthesia, 2 g Kefzol and 900 clindamycin due to history of exposure to MRSA and no MRSA admission test.  He was placed on the fracture table while leg in gentle flexion and external rotation.  Foley to gravity.  Perineal post well padded.  The left leg, no retraction, appropriate positioning.  The left hip was prepped and draped in usual sterile fashion.  Incision was made just over the trochanter and was then inferior to that.  Subcutaneous tissue was dissected.  Cautery was utilized to achieve hemostasis.  The fascia was identified by lateral skin incision, identified the trochanter, and under x-ray in the AP and lateral plane, we placed 3 pins parallel in an inverted T-fashion above the cortex of the femur.  This dictated in a certain arrangement.  Three guide pins were placed.  We advanced the cannulated screws under power and then hand tightened to finish with  excellent purchase.  We used a 90- 95 mm length screws after appropriate measurement.  Excellent purchase was noted in the AP and lateral plane.  Excellent placement.  Copiously irrigated the wound.  Cautery was utilized to achieve hemostasis. Closed the deep fascia with 1 Vicryl, subcu with 2-0 skin with small stapler.  Wound dressed sterilely.  He was removed from the fracture table, good pulses, appropriate rotation, transported to recovery in satisfactory condition.  The patient tolerated the procedure well.  No complications.  Minimal blood loss.     Jene Every, M.D.     [name]  D:  06/19/2012  T:  06/20/2012  Job:  [id number]

## 2012-06-20 NOTE — Progress Notes (Addendum)
[facility] PROGRESS NOTE  Donald Carlson YNW:[id number] DOB: 08-Apr-1945 DOA: 06/18/2012 PCP: Marga Melnick, MD  Assessment/Plan: Left subcapital hip fracture  -Pain control  -Appreciate orthopedic  - continue PT  -S/P ORIF 06/20/12 Diabetes mellitus type 2  -Hemoglobin A1c 6.4 on 04/26/2012  -Continue NovoLog sliding scale while in the hospital  -Hold metformin and Amaryl  -CBGs controlled Hypertension  -Continue carvedilol and lisinopril  -Elevated blood pressure partly due to pain  -BPs have been labile Hyperlipidemia  -Continue statin     Family Communication:   daughter at beside Disposition Plan:   Home when okay with ortho   Procedures:  L hip ORIF--06/19/12       Procedures/Studies: Dg Hip Complete Left  06/18/2012  *RADIOLOGY REPORT*  Clinical Data: Fall with left hip pain.  LEFT HIP - COMPLETE 2+ VIEW  Comparison: None.  Findings: There is foreshortening and abnormal rotation of the left hip relative to the right.  It is suspected that there is a probable subtle subcapital fracture.  This appears nondisplaced and not significantly impacted.  The rest of the bony pelvis is intact.  IMPRESSION: Probable subtle nondisplaced subcapital left hip fracture.   Original Report Authenticated By: Irish Lack, M.D.    Dg Hip Operative Left  06/19/2012  *RADIOLOGY REPORT*  Clinical Data: Left hip fracture  OPERATIVE LEFT HIP  Comparison: 06/18/2012  Findings: .  C-arm films document placement of two cannulated screws across the patient's left hip fracture.  Satisfactory position and alignment.  IMPRESSION: As above.   Original Report Authenticated By: Davonna Belling, M.D.    Dg Chest Portable 1 View  06/18/2012  *RADIOLOGY REPORT*  Clinical Data: Preop, left hip injury  PORTABLE CHEST - 1 VIEW  Comparison: 01/03/2011  Findings: Chronic interstitial markings.  No focal consolidation. No pleural effusion or pneumothorax.  The heart is normal in size.  IMPRESSION: No  evidence of acute cardiopulmonary disease.   Original Report Authenticated By: Charline Bills, M.D.          Subjective: Pain is controlled. Denies any fevers, chills, chest pain, shortness breath, nausea, vomiting, diarrhea, abdominal pain. No dysuria or hematuria.  Objective: Filed Vitals:   06/20/12 0758 06/20/12 0800 06/20/12 1017 06/20/12 1402  BP: 174/88 174/88 151/77 126/67  Pulse:   84 74  Temp:   98.9 F (37.2 C) 98.3 F (36.8 C)  TempSrc:   Oral   Resp:   16 17  Height:      Weight:      SpO2:   93% 93%    Intake/Output Summary (Last 24 hours) at 06/20/12 1757 Last data filed at 06/20/12 1300  Gross per 24 hour  Intake 1904.58 ml  Output   2660 ml  Net -755.42 ml   Weight change:  Exam:   General:  Pt is alert, follows commands appropriately, not in acute distress  HEENT: No icterus, No thrush,  [place]/AT  Cardiovascular: RRR, S1/S2, no rubs, no gallops  Respiratory: CTA bilaterally, no wheezing, no crackles, no rhonchi  Abdomen: Soft/+BS, non tender, non distended, no guarding  Extremities: No edema, No lymphangitis, No petechiae, No rashes, no synovitis  Data Reviewed: Basic Metabolic Panel:  Recent Labs Lab 06/18/12 1805 06/19/12 0350 06/19/12 1526 06/20/12 0355  NA 140 137 135 134*  K 4.3 3.9 4.1 4.0  CL 105 103 102 100  CO2 26 28 26 27   GLUCOSE 76 86 90 108*  BUN 20 17 14 12   CREATININE 1.12 1.01 0.89  0.90  CALCIUM 9.6 9.1 8.8 8.7   Liver Function Tests:  Recent Labs Lab 06/19/12 1526  AST 50*  ALT 54*  ALKPHOS 63  BILITOT 0.6  PROT 6.2  ALBUMIN 3.2*   No results found for this basename: LIPASE, AMYLASE,  in the last 168 hours No results found for this basename: AMMONIA,  in the last 168 hours CBC:  Recent Labs Lab 06/18/12 1805 06/19/12 0350 06/19/12 1526 06/20/12 0355  WBC 11.2* 7.3 6.5 9.1  HGB 12.6* 12.4* 12.4* 12.5*  HCT 38.2* 37.1* 36.8* 37.5*  MCV 85.3 85.1 85.6 85.6  PLT 184 157 146* 141*   Cardiac  Enzymes: No results found for this basename: CKTOTAL, CKMB, CKMBINDEX, TROPONINI,  in the last 168 hours BNP: No components found with this basename: POCBNP,  CBG:  Recent Labs Lab 06/19/12 2007 06/19/12 2152 06/20/12 0705 06/20/12 1112 06/20/12 1652  GLUCAP 105* 95 91 100* 85    No results found for this or any previous visit (from the past 240 hour(s)).   Scheduled Meds: . allopurinol  100 mg Oral Daily  . betaxolol  1 drop Both Eyes BID  . carvedilol  25 mg Oral BID WC  . docusate sodium  100 mg Oral BID  . enoxaparin (LOVENOX) injection  40 mg Subcutaneous Q24H  . insulin aspart  0-15 Units Subcutaneous TID WC  . lisinopril  20 mg Oral Daily  . pantoprazole  40 mg Oral Daily  . simvastatin  20 mg Oral q1800   Continuous Infusions: . sodium chloride 75 mL/hr at 06/18/12 1949  . lactated ringers       Jamille Fisher, DO  [facility] Pager [phone]  If 7PM-7AM, please contact night-coverage [email or web address] Password [email or web address] 06/20/2012, 5:57 PM   LOS: 2 days

## 2012-06-20 NOTE — Evaluation (Signed)
Physical Therapy Evaluation Patient Details Name: Donald Carlson MRN: [id number] DOB: Sep 30, 1945 Today's Date: 06/20/2012 Time: 4098-1191 PT Time Calculation (min): 33 min  PT Assessment / Plan / Recommendation Clinical Impression  Pt s/p L hip fx with ORIF repair presents with functional mobility limited by decreased L LE strength/ROM, post op pain and TTWB status.    PT Assessment  Patient needs continued PT services    Follow Up Recommendations  Home health PT    Does the patient have the potential to tolerate intense rehabilitation      Barriers to Discharge None      Equipment Recommendations       Recommendations for Other Services OT consult   Frequency 7X/week    Precautions / Restrictions Precautions Precautions: Fall Restrictions Weight Bearing Restrictions: Yes LLE Weight Bearing: Touchdown weight bearing   Pertinent Vitals/Pain 3/10; premed, ice pack provided      Mobility  Bed Mobility Bed Mobility: Supine to Sit Supine to Sit: 4: Min assist;3: Mod assist Details for Bed Mobility Assistance: cues for sequence and use of R LE and UEs to self assist Transfers Transfers: Sit to Stand;Stand to Sit Sit to Stand: 4: Min assist Stand to Sit: 4: Min assist Details for Transfer Assistance: cues for LE management and use of UEs to self assist Ambulation/Gait Ambulation/Gait Assistance: 4: Min assist Ambulation Distance (Feet): 52 Feet Assistive device: Rolling walker Ambulation/Gait Assistance Details: CUes for sequence, posture, postion from RW and TTWB Gait Pattern: Step-to pattern;Decreased step length - right;Decreased step length - left General Gait Details: Pt demonstrating good compliance with TTWB    Exercises Total Joint Exercises Ankle Circles/Pumps: AROM;15 reps;Supine;Both Quad Sets: AROM;10 reps;Supine;Both Heel Slides: AAROM;15 reps;Left;Supine Hip ABduction/ADduction: AAROM;Left;10 reps;Supine   PT Diagnosis: Difficulty walking  PT  Problem List: Decreased strength;Decreased range of motion;Decreased activity tolerance;Decreased knowledge of use of DME;Pain;Decreased mobility PT Treatment Interventions: DME instruction;Gait training;Stair training;Functional mobility training;Therapeutic activities;Therapeutic exercise;Patient/family education   PT Goals Acute Rehab PT Goals PT Goal Formulation: With patient Time For Goal Achievement: 06/27/12 Potential to Achieve Goals: Good Pt will go Supine/Side to Sit: with supervision PT Goal: Supine/Side to Sit - Progress: Goal set today Pt will go Sit to Supine/Side: with supervision PT Goal: Sit to Supine/Side - Progress: Goal set today Pt will go Sit to Stand: with supervision PT Goal: Sit to Stand - Progress: Goal set today Pt will go Stand to Sit: with supervision PT Goal: Stand to Sit - Progress: Goal set today Pt will Ambulate: 51 - 150 feet;with supervision;with rolling walker PT Goal: Ambulate - Progress: Goal set today Pt will Go Up / Down Stairs: 1-2 stairs;with min assist;with least restrictive assistive device PT Goal: Up/Down Stairs - Progress: Goal set today  Visit Information  Last PT Received On: 06/20/12 Assistance Needed: +1    Subjective Data  Subjective: The Dr might let me go home this afternoon but I am not sure I will be ready Patient Stated Goal: Home   Prior Functioning  Home Living Lives With: Spouse Available Help at Discharge: Family Type of Home: House Home Access: Stairs to enter Entrance Stairs-Number of Steps: 2 Entrance Stairs-Rails: None Home Layout: One level Prior Function Level of Independence: Independent Able to Take Stairs?: Yes Driving: Yes Vocation: Full time employment Communication Communication: No difficulties    Cognition  Cognition Overall Cognitive Status: Appears within functional limits for tasks assessed/performed Arousal/Alertness: Awake/alert Orientation Level: Appears intact for tasks  assessed Behavior During Session: [facility] for  tasks performed    Extremity/Trunk Assessment Right Upper Extremity Assessment RUE ROM/Strength/Tone: [facility] for tasks assessed Left Upper Extremity Assessment LUE ROM/Strength/Tone: WFL for tasks assessed Right Lower Extremity Assessment RLE ROM/Strength/Tone: [facility] for tasks assessed Left Lower Extremity Assessment LLE ROM/Strength/Tone: Deficits LLE ROM/Strength/Tone Deficits: Hip strength 2/5 wtih AAROM to 80 hip flex and 20 abd   Balance    End of Session PT - End of Session Equipment Utilized During Treatment: Gait belt Activity Tolerance: Patient tolerated treatment well Patient left: in chair;with call bell/phone within reach Nurse Communication: Mobility status  GP     Donald Carlson 06/20/2012, 12:16 PM

## 2012-06-20 NOTE — Discharge Summary (Addendum)
Physician Discharge Summary  ARIS EVEN OZH:[id number] DOB: 11/13/1945 DOA: 06/18/2012  PCP: Marga Melnick, MD  Admit date: 06/18/2012 Discharge date: 06/21/2012  Recommendations for Outpatient Follow-up:  1. Pt will need to follow up with PCP in 2 weeks post discharge 2. Please obtain BMP to evaluate electrolytes and kidney function 3. Please also check CBC to evaluate Hg and Hct levels 4. Follow up Dr. Donnal Debar. Shelle Iron to arrange follow up  Discharge Diagnoses:  Principal Problem:   Closed left hip fracture Active Problems:   DIABETES MELLITUS, TYPE II   HYPERLIPIDEMIA   HYPERTENSION, ESSENTIAL NOS Left subcapital hip fracture  -Pain controlled -Dr. Shelle Iron was consulted and performed surgery -Patient participated with physical therapy during his inpatient stay -Physical therapy recommended continued home health physical therapy which was set up prior to patient's discharge -S/P ORIF 06/19/12  -Up with therapy TTWB LLE per ortho recommendations -DVT prophylaxis per ortho Diabetes mellitus type 2  -Hemoglobin A1c 6.4 on 04/26/2012  -Continue NovoLog sliding scale while in the hospital  -Hold metformin and Amaryl  -CBGs controlled during his hospitalization -The patient will restart his metformin and Amaryl after discharge -restart ASA 81 mg daily after finished with Ascriptin Hypertension  -Continue carvedilol and lisinopril  -Elevated blood pressure partly due to pain  -BPs have been labile  Hyperlipidemia  -Continue statin   Discharge Condition: stable  Disposition:  Follow-up Information   Follow up with BEANE,JEFFREY C, MD In 2 weeks.   Contact information:   [address] [place] [address] [phone]       Diet:heart healthy Wt Readings from Last 3 Encounters:  06/18/12 98.884 kg (218 lb)  06/18/12 98.884 kg (218 lb)  05/10/12 99.428 kg (219 lb 3.2 oz)    History of present illness:  67 year old male with history of hypertension,  diabetes and hyperlipidemia presented to the ED after falling from a ladder on a concrete surface while at work this morning and and on his left hip. He denies sustaining any other injury or loss of consciousness. Fall was purely mechanical. Denies any dizziness, headache, blurry vision, chest pain, palpitations, shortness of breath, abdominal pain, nausea, vomiting, fever, chills, bowel or urinary symptoms.  In the ED vitals were stable. She had severe pain on his left hip for which an x-ray of the hip was done which showed a probable nondisplaced subcapital left fracture. Hospitalist called for admission. Ortho Consult called from ED     Consultants: Ortho--Dr. Shelle Iron  Discharge Exam: Filed Vitals:   06/21/12 0950  BP: 167/82  Pulse:   Temp: 98.5 F (36.9 C)  Resp:    Filed Vitals:   06/20/12 2044 06/21/12 0345 06/21/12 0604 06/21/12 0950  BP: 154/73  181/85 167/82  Pulse: 81  75   Temp: 99.2 F (37.3 C)  98.5 F (36.9 C) 98.5 F (36.9 C)  TempSrc: Oral  Oral Oral  Resp: 16 16 16    Height:      Weight:      SpO2: 95%  96%    General: A&O x 3, NAD, pleasant, cooperative Cardiovascular: RRR, no rub, no gallop, no S3 Respiratory: CTAB, no wheeze, no rhonchi Abdomen:soft, nontender, nondistended, positive bowel sounds Extremities: No edema, No lymphangitis, no petechiae  Discharge Instructions      Discharge Orders   Future Orders Complete By Expires     Diet - low sodium heart healthy  As directed     Discharge instructions  As directed  Comments:      Do not take aspirin 81 mg while you are taking Ascriptin Restart aspirin 81 mg daily the day after you are finished taking Ascriptin    Increase activity slowly  As directed     Touch down weight bearing  As directed         Medication List    STOP taking these medications       indomethacin 50 MG capsule  Commonly known as:  INDOCIN      TAKE these medications       allopurinol 100 MG tablet  Commonly  known as:  ZYLOPRIM  Take 1 tablet (100 mg total) by mouth daily.     ASCRIPTIN 325 MG Tabs  Take 325 capsules by mouth 2 (two) times daily after a meal.     aspirin 81 MG tablet  Take 81 mg by mouth daily.     betaxolol 0.25 % ophthalmic suspension  Commonly known as:  BETOPTIC-S  Place 1 drop into both eyes 2 (two) times daily.     carvedilol 25 MG tablet  Commonly known as:  COREG  Take 25 mg by mouth 2 (two) times daily with a meal.     Flax Seed Oil 1000 MG Caps  Take 1,000 mg by mouth once.     glimepiride 2 MG tablet  Commonly known as:  AMARYL  Take 2 mg by mouth daily before breakfast.     HYDROcodone-acetaminophen 7.5-325 MG per tablet  Commonly known as:  NORCO  Take 1-2 tablets by mouth every 4 (four) hours as needed for pain.     lisinopril 20 MG tablet  Commonly known as:  PRINIVIL,ZESTRIL  Take 1 tablet (20 mg total) by mouth daily.     metFORMIN 500 MG tablet  Commonly known as:  GLUCOPHAGE  Take 500 mg by mouth 2 (two) times daily with a meal.     NASCOBAL 500 MCG/0.1ML Soln  Generic drug:  Cyanocobalamin  Place 1 mcg into the nose once a week.     pravastatin 40 MG tablet  Commonly known as:  PRAVACHOL  Take 40 mg by mouth daily.     saw palmetto 160 MG capsule  Take 160 mg by mouth 2 (two) times daily.     Vitamin D 1000 UNITS capsule  Take 1,000 Units by mouth daily.         The results of significant diagnostics from this hospitalization (including imaging, microbiology, ancillary and laboratory) are listed below for reference.    Significant Diagnostic Studies: Dg Hip Complete Left  06/18/2012  *RADIOLOGY REPORT*  Clinical Data: Fall with left hip pain.  LEFT HIP - COMPLETE 2+ VIEW  Comparison: None.  Findings: There is foreshortening and abnormal rotation of the left hip relative to the right.  It is suspected that there is a probable subtle subcapital fracture.  This appears nondisplaced and not significantly impacted.  The rest of the  bony pelvis is intact.  IMPRESSION: Probable subtle nondisplaced subcapital left hip fracture.   Original Report Authenticated By: Irish Lack, M.D.    Dg Hip Operative Left  06/19/2012  *RADIOLOGY REPORT*  Clinical Data: Left hip fracture  OPERATIVE LEFT HIP  Comparison: 06/18/2012  Findings: .  C-arm films document placement of two cannulated screws across the patient's left hip fracture.  Satisfactory position and alignment.  IMPRESSION: As above.   Original Report Authenticated By: Davonna Belling, M.D.    Dg Chest Portable 1 View  06/18/2012  *RADIOLOGY REPORT*  Clinical Data: Preop, left hip injury  PORTABLE CHEST - 1 VIEW  Comparison: 01/03/2011  Findings: Chronic interstitial markings.  No focal consolidation. No pleural effusion or pneumothorax.  The heart is normal in size.  IMPRESSION: No evidence of acute cardiopulmonary disease.   Original Report Authenticated By: Charline Bills, M.D.      Microbiology: No results found for this or any previous visit (from the past 240 hour(s)).   Labs: Basic Metabolic Panel:  Recent Labs Lab 06/18/12 1805 06/19/12 0350 06/19/12 1526 06/20/12 0355 06/21/12 0411  NA 140 137 135 134* 135  K 4.3 3.9 4.1 4.0 4.1  CL 105 103 102 100 100  CO2 26 28 26 27 28   GLUCOSE 76 86 90 108* 87  BUN 20 17 14 12 13   CREATININE 1.12 1.01 0.89 0.90 0.96  CALCIUM 9.6 9.1 8.8 8.7 9.0   Liver Function Tests:  Recent Labs Lab 06/19/12 1526  AST 50*  ALT 54*  ALKPHOS 63  BILITOT 0.6  PROT 6.2  ALBUMIN 3.2*   No results found for this basename: LIPASE, AMYLASE,  in the last 168 hours No results found for this basename: AMMONIA,  in the last 168 hours CBC:  Recent Labs Lab 06/18/12 1805 06/19/12 0350 06/19/12 1526 06/20/12 0355 06/21/12 0411  WBC 11.2* 7.3 6.5 9.1 8.3  HGB 12.6* 12.4* 12.4* 12.5* 12.3*  HCT 38.2* 37.1* 36.8* 37.5* 36.8*  MCV 85.3 85.1 85.6 85.6 85.8  PLT 184 157 146* 141* 156   Cardiac Enzymes: No results found for this  basename: CKTOTAL, CKMB, CKMBINDEX, TROPONINI,  in the last 168 hours BNP: No components found with this basename: POCBNP,  CBG:  Recent Labs Lab 06/20/12 0705 06/20/12 1112 06/20/12 1652 06/20/12 2219 06/21/12 0737  GLUCAP 91 100* 85 100* 89    Time coordinating discharge:  Greater than 30 minutes  Signed:  Veola Cafaro, DO [facility] Pager: [phone] 06/21/2012, 12:00 PM

## 2012-06-20 NOTE — Progress Notes (Signed)
Physical Therapy Treatment Patient Details Name: Donald Carlson MRN: [id number] DOB: [birth year] Today's Date: 06/20/2012 Time: 4098-1191 PT Time Calculation (min): 14 min  PT Assessment / Plan / Recommendation Comments on Treatment Session       Follow Up Recommendations  Home health PT     Does the patient have the potential to tolerate intense rehabilitation     Barriers to Discharge        Equipment Recommendations  None recommended by PT    Recommendations for Other Services OT consult  Frequency 7X/week   Plan Discharge plan remains appropriate    Precautions / Restrictions Precautions Precautions: Fall Restrictions Weight Bearing Restrictions: Yes LLE Weight Bearing: Touchdown weight bearing   Pertinent Vitals/Pain     Mobility  Transfers Transfers: Sit to Stand;Stand to Sit Sit to Stand: 4: Min assist Stand to Sit: 4: Min assist Details for Transfer Assistance: cues for LE management and use of UEs to self assist Ambulation/Gait Ambulation/Gait Assistance: 4: Min assist Ambulation Distance (Feet): 111 Feet Assistive device: Rolling walker Ambulation/Gait Assistance Details: cues for posture and position from RW Gait Pattern: Step-to pattern;Decreased step length - right;Decreased step length - left General Gait Details: Pt demonstrating good compliance with TTWB    Exercises     PT Diagnosis:    PT Problem List:   PT Treatment Interventions:     PT Goals Acute Rehab PT Goals PT Goal Formulation: With patient Time For Goal Achievement: 06/27/12 Potential to Achieve Goals: Good Pt will go Supine/Side to Sit: with supervision PT Goal: Supine/Side to Sit - Progress: Goal set today Pt will go Sit to Supine/Side: with supervision PT Goal: Sit to Supine/Side - Progress: Goal set today Pt will go Sit to Stand: with supervision PT Goal: Sit to Stand - Progress: Goal set today Pt will go Stand to Sit: with supervision PT Goal: Stand to Sit - Progress:  Goal set today Pt will Ambulate: 51 - 150 feet;with supervision;with rolling walker PT Goal: Ambulate - Progress: Goal set today Pt will Go Up / Down Stairs: 1-2 stairs;with min assist;with least restrictive assistive device PT Goal: Up/Down Stairs - Progress: Goal set today  Visit Information  Last PT Received On: 06/20/12 Assistance Needed: +1    Subjective Data  Patient Stated Goal: Home   Cognition  Cognition Overall Cognitive Status: Appears within functional limits for tasks assessed/performed Arousal/Alertness: Awake/alert Orientation Level: Appears intact for tasks assessed Behavior During Session: [facility] for tasks performed    Balance     End of Session PT - End of Session Equipment Utilized During Treatment: Gait belt Activity Tolerance: Patient tolerated treatment well Patient left: in chair;with call bell/phone within reach Nurse Communication: Mobility status   GP     Donald Carlson 06/20/2012, 3:55 PM

## 2012-06-20 NOTE — Progress Notes (Signed)
Pharmacy: Protonix IV to PO   This patient is receiving IV Protonix. Based on criteria approved by the Pharmacy and Therapeutics Committee, this medication is being converted to the equivalent oral dose form. These criteria include:   . The patient is eating (either orally or per tube) and/or has been taking other orally administered medications for at least 24 hours.  . This patient has no evidence of active gastrointestinal bleeding or impaired GI absorption (gastrectomy, short bowel, patient on TNA or NPO).   If you have questions about this conversion, please contact the pharmacy department.  Clydene Fake, [facility] 06/20/2012 9:51 AM

## 2012-06-21 LAB — BASIC METABOLIC PANEL
BUN: 13 mg/dL (ref 6–23)
CO2: 28 mEq/L (ref 19–32)
Calcium: 9 mg/dL (ref 8.4–10.5)
Chloride: 100 mEq/L (ref 96–112)
Creatinine, Ser: 0.96 mg/dL (ref 0.50–1.35)
GFR calc Af Amer: >90 mL/min (ref >90)
GFR calc non Af Amer: 84 mL/min — ABNORMAL LOW (ref >90)
Glucose, Bld: 87 mg/dL (ref 70–99)
Potassium: 4.1 mEq/L (ref 3.5–5.1)
Sodium: 135 mEq/L (ref 135–145)

## 2012-06-21 LAB — CBC
HCT: 36.8 % — ABNORMAL LOW (ref 39.0–52.0)
Hemoglobin: 12.3 g/dL — ABNORMAL LOW (ref 13.0–17.0)
MCH: 28.7 pg (ref 26.0–34.0)
MCHC: 33.4 g/dL (ref 30.0–36.0)
MCV: 85.8 fL (ref 78.0–100.0)
Platelets: 156 10*3/uL (ref 150–400)
RBC: 4.29 MIL/uL (ref 4.22–5.81)
RDW: 13.8 % (ref 11.5–15.5)
WBC: 8.3 10*3/uL (ref 4.0–10.5)

## 2012-06-21 LAB — GLUCOSE, CAPILLARY: Glucose-Capillary: 89 mg/dL (ref 70–99)

## 2012-06-21 LAB — VITAMIN D 1,25 DIHYDROXY
Vitamin D 1, 25 (OH)2 Total: 43 pg/mL (ref 18–72)
Vitamin D2 1, 25 (OH)2: <8 pg/mL
Vitamin D3 1, 25 (OH)2: 43 pg/mL

## 2012-06-21 NOTE — Progress Notes (Signed)
Subjective: 2 Days Post-Op Procedure(s) (LRB): CANNULATED HIP PINNING (Left) Patient reports pain as mild.  Notes mild pain L lateral hip, incisional. C/o constipation. No other c/o. PT going well.  Objective: Vital signs in last 24 hours: Temp:  [98.3 F (36.8 C)-99.2 F (37.3 C)] 98.5 F (36.9 C) (04/04 0604) Pulse Rate:  [74-84] 75 (04/04 0604) Resp:  [16-17] 16 (04/04 0604) BP: (126-181)/(67-88) 181/85 mmHg (04/04 0604) SpO2:  [93 %-96 %] 96 % (04/04 0604)  Intake/Output from previous day: 04/03 0701 - 04/04 0700 In: 1675 [P.O.:960; I.V.:715] Out: 1950 [Urine:1950] Intake/Output this shift:     Recent Labs  06/18/12 1805 06/19/12 0350 06/19/12 1526 06/20/12 0355 06/21/12 0411  HGB 12.6* 12.4* 12.4* 12.5* 12.3*    Recent Labs  06/20/12 0355 06/21/12 0411  WBC 9.1 8.3  RBC 4.38 4.29  HCT 37.5* 36.8*  PLT 141* 156    Recent Labs  06/20/12 0355 06/21/12 0411  NA 134* 135  K 4.0 4.1  CL 100 100  CO2 27 28  BUN 12 13  CREATININE 0.90 0.96  GLUCOSE 108* 87  CALCIUM 8.7 9.0    Recent Labs  06/18/12 1805 06/19/12 1526  INR 1.06 1.04    Neurologically intact Neurovascular intact Sensation intact distally Intact pulses distally Dorsiflexion/Plantar flexion intact Incision: dressing C/D/I and no drainage No cellulitis present Compartment soft No calf pain or sign of DVT  Assessment/Plan: 2 Days Post-Op Procedure(s) (LRB): CANNULATED HIP PINNING (Left) Advance diet Up with therapy TTWB LLE Bowel protocol D/C per admitting service when medically stable Will discuss with Dr. Elissa Lovett, Dayna Barker. 06/21/2012, 7:33 AM

## 2012-06-21 NOTE — Care Management Note (Signed)
    Page 1 of 2   06/21/2012     4:08:05 PM   CARE MANAGEMENT NOTE 06/21/2012  Patient:  Donald Carlson, Donald Carlson   Account Number:  [id number]  Date Initiated:  06/20/2012  Documentation initiated by:  Colleen Can  Subjective/Objective Assessment:   dx left femoral neck fracture hip; ORIF     Action/Plan:   CM spoke with patient. Plans are for patient to return to his home in [place] where family members will care for him. He will need 3n1   Anticipated DC Date:  06/23/2012   Anticipated DC Plan:  HOME W HOME HEALTH SERVICES  In-house referral  Clinical Social Worker      DC Planning Services  CM consult      PAC Choice  DURABLE MEDICAL EQUIPMENT  HOME HEALTH   Choice offered to / List presented to:  C-1 Patient   DME arranged  3-N-1      DME agency  [facility]     HH arranged  HH-2 PT      [facility] agency  [facility]   Status of service:  Completed, signed off Medicare Important Message given?  NA - LOS <3 / Initial given by admissions (If response is "NO", the following Medicare IM given date fields will be blank) Date Medicare IM given:   Date Additional Medicare IM given:    Discharge Disposition:  HOME W HOME HEALTH SERVICES  Per UR Regulation:  Reviewed for med. necessity/level of care/duration of stay  If discussed at Long Length of Stay Meetings, dates discussed:    Comments:  06/21/2012 Colleen Can BSN RN CCM [phone] [facility] services with start date of day after pt is discharged. DME has been delivered to patient's room.

## 2012-06-21 NOTE — Progress Notes (Signed)
Physical Therapy Treatment Patient Details Name: ANTAVION BARTOSZEK MRN: [id number] DOB: 02-19-1946 Today's Date: 06/21/2012 Time: 0911-0955 PT Time Calculation (min): 44 min  PT Assessment / Plan / Recommendation Comments on Treatment Session       Follow Up Recommendations  Home health PT     Does the patient have the potential to tolerate intense rehabilitation     Barriers to Discharge        Equipment Recommendations  None recommended by PT    Recommendations for Other Services OT consult  Frequency 7X/week   Plan Discharge plan remains appropriate    Precautions / Restrictions Precautions Precautions: Fall Restrictions Weight Bearing Restrictions: Yes LLE Weight Bearing: Touchdown weight bearing   Pertinent Vitals/Pain 6/10; premed, RN aware and providing muscle relaxor, ice pack provided    Mobility  Bed Mobility Bed Mobility: Supine to Sit;Sit to Supine Supine to Sit: 4: Min guard Sit to Supine: 4: Min guard Details for Bed Mobility Assistance: cues for sequence and use of R LE and UEs to self assist Transfers Transfers: Sit to Stand;Stand to Sit Sit to Stand: 5: Supervision;With upper extremity assist;From bed;From chair/3-in-1 Stand to Sit: 5: Supervision;To bed;To chair/3-in-1;With armrests;With upper extremity assist Details for Transfer Assistance: cues to extend leg Ambulation/Gait Ambulation/Gait Assistance: 5: Supervision Ambulation Distance (Feet): 75 Feet (2 x 20') Assistive device: Rolling walker Ambulation/Gait Assistance Details: min cues for posture and position from RW Gait Pattern: Step-to pattern General Gait Details: Pt demonstrating good compliance with TTWB Stairs: Yes Stairs Assistance: 4: Min assist Stairs Assistance Details (indicate cue type and reason): cues for sequence and foot/crutch/RW placement Stair Management Technique: No rails;Backwards;Forwards;With crutches;With walker;Step to pattern Number of Stairs: 8 (4 fwd with  crutches; 4 bkwd with RW)    Exercises Total Joint Exercises Ankle Circles/Pumps: AROM;15 reps;Supine;Both Quad Sets: AROM;10 reps;Supine;Both Heel Slides: AAROM;Left;Supine;20 reps Hip ABduction/ADduction: AAROM;Left;Supine;20 reps   PT Diagnosis:    PT Problem List:   PT Treatment Interventions:     PT Goals Acute Rehab PT Goals PT Goal Formulation: With patient Time For Goal Achievement: 06/27/12 Potential to Achieve Goals: Good Pt will go Supine/Side to Sit: with supervision PT Goal: Supine/Side to Sit - Progress: Progressing toward goal Pt will go Sit to Supine/Side: with supervision PT Goal: Sit to Supine/Side - Progress: Progressing toward goal Pt will go Sit to Stand: with supervision PT Goal: Sit to Stand - Progress: Progressing toward goal Pt will go Stand to Sit: with supervision PT Goal: Stand to Sit - Progress: Progressing toward goal Pt will Ambulate: 51 - 150 feet;with supervision;with rolling walker PT Goal: Ambulate - Progress: Progressing toward goal Pt will Go Up / Down Stairs: 1-2 stairs;with min assist;with least restrictive assistive device PT Goal: Up/Down Stairs - Progress: Progressing toward goal  Visit Information  Last PT Received On: 06/21/12 Assistance Needed: +1    Subjective Data  Subjective: I think I am going home today Patient Stated Goal: Home   Cognition  Cognition Overall Cognitive Status: Appears within functional limits for tasks assessed/performed Arousal/Alertness: Awake/alert Orientation Level: Appears intact for tasks assessed Behavior During Session: [facility] for tasks performed    Balance     End of Session PT - End of Session Equipment Utilized During Treatment: Gait belt Activity Tolerance: Patient tolerated treatment well Patient left: in chair;with call bell/phone within reach Nurse Communication: Mobility status   GP     Haylen Bellotti 06/21/2012, 10:19 AM

## 2012-06-21 NOTE — Progress Notes (Signed)
CSW reviewed chart per hip fx protocol. PT recommends HHPT following hospital d/c. RNCM will assist with d/c planning to home. No CSW needs at this time. CSW is available to assist with SNF placement if plan changes and placement is needed.  Jmaie Selena Swaminathan LCSW [phone]

## 2012-06-21 NOTE — Evaluation (Signed)
Occupational Therapy Evaluation Patient Details Name: Donald Carlson MRN: [id number] DOB: 1945-07-22 Today's Date: 06/21/2012 Time: 1308-6578 OT Time Calculation (min): 29 min  OT Assessment / Plan / Recommendation Clinical Impression  This 67 year old man was admitted for L hip fx managed by pinning.  He is TDWB.  All education was completed and pt does not need any further OT at this time.      OT Assessment  Patient does not need any further OT services    Follow Up Recommendations  No OT follow up    Barriers to Discharge      Equipment Recommendations  3 in 1 bedside comode    Recommendations for Other Services    Frequency       Precautions / Restrictions Precautions Precautions: Fall Restrictions LLE Weight Bearing: Touchdown weight bearing   Pertinent Vitals/Pain 3/10 L hip; meds brought, repositioned and ice reapplied    ADL  Grooming: Performed;Supervision/safety Where Assessed - Grooming: Supported standing Upper Body Bathing: Performed;Set up Where Assessed - Upper Body Bathing: Unsupported sitting Lower Body Bathing: Performed;Minimal assistance Where Assessed - Lower Body Bathing: Supported sit to stand Upper Body Dressing: Performed;Set up Where Assessed - Upper Body Dressing: Supported sitting Lower Body Dressing: Performed;Maximal assistance Where Assessed - Lower Body Dressing: Supported sit to stand Toilet Transfer: Performed;Minimal Dentist Method: Sit to Barista: Comfort height toilet;Grab bars Toileting - Architect and Hygiene: Supervision/safety Where Assessed - Engineer, mining and Hygiene: Sit to stand from 3-in-1 or toilet Equipment Used: Rolling walker Transfers/Ambulation Related to ADLs: min guard ambulating to bathroom.  Pt is good with TDWB ADL Comments: Pt will have wife help with adls.  Not interested in AE.  Would benefit from 3:1 over commode.  Educated on tub  bench:  Pt will likely sponge bathe    OT Diagnosis:    OT Problem List:   OT Treatment Interventions:     OT Goals    Visit Information  Last OT Received On: 06/21/12 Assistance Needed: +1    Subjective Data  Subjective: How far am I allowed to reach (during adls) Patient Stated Goal: home today   Prior Functioning     Home Living Lives With: Spouse Available Help at Discharge: Family Type of Home: House Home Access: Stairs to enter Secretary/administrator of Steps: 2 Entrance Stairs-Rails: None Home Layout: One level Bathroom Shower/Tub: Engineer, manufacturing systems: Handicapped height (17 1/2 inch) Home Adaptive Equipment:  (walkers) Prior Function Level of Independence: Independent Able to Take Stairs?: Yes Driving: Yes Vocation: Full time employment Communication Communication: No difficulties         Vision/Perception     Cognition  Cognition Overall Cognitive Status: Appears within functional limits for tasks assessed/performed Arousal/Alertness: Awake/alert Orientation Level: Appears intact for tasks assessed Behavior During Session: [facility] for tasks performed    Extremity/Trunk Assessment Right Upper Extremity Assessment RUE ROM/Strength/Tone: [facility] for tasks assessed Left Upper Extremity Assessment LUE ROM/Strength/Tone: WFL for tasks assessed     Mobility Bed Mobility Supine to Sit: 4: Min assist;With rails Transfers Sit to Stand: 4: Min guard;From bed;With upper extremity assist Details for Transfer Assistance: cues to extend leg     Exercise     Balance     End of Session OT - End of Session Activity Tolerance: Patient tolerated treatment well Patient left: in chair;with call bell/phone within reach  GO     [facility] 06/21/2012, 9:12 AM Marica Otter, OTR/L [phone]  06/21/2012 

## 2012-06-29 ENCOUNTER — Other Ambulatory Visit: Payer: Self-pay | Admitting: Internal Medicine

## 2012-08-02 ENCOUNTER — Other Ambulatory Visit (INDEPENDENT_AMBULATORY_CARE_PROVIDER_SITE_OTHER): Payer: Medicare Other

## 2012-08-02 DIAGNOSIS — E1059 Type 1 diabetes mellitus with other circulatory complications: Secondary | ICD-10-CM

## 2012-08-02 DIAGNOSIS — I10 Essential (primary) hypertension: Secondary | ICD-10-CM

## 2012-08-02 LAB — BASIC METABOLIC PANEL
BUN: 17 mg/dL (ref 6–23)
CO2: 30 mEq/L (ref 19–32)
Calcium: 9.3 mg/dL (ref 8.4–10.5)
Chloride: 105 mEq/L (ref 96–112)
Creatinine, Ser: 0.9 mg/dL (ref 0.4–1.5)
GFR: 84.94 mL/min (ref >60.00)
Glucose, Bld: 82 mg/dL (ref 70–99)
Potassium: 4.1 mEq/L (ref 3.5–5.1)
Sodium: 141 mEq/L (ref 135–145)

## 2012-08-02 LAB — HEMOGLOBIN A1C: Hgb A1c MFr Bld: 5.9 % (ref 4.6–6.5)

## 2012-10-20 ENCOUNTER — Other Ambulatory Visit: Payer: Self-pay | Admitting: Internal Medicine

## 2012-10-23 ENCOUNTER — Other Ambulatory Visit: Payer: Self-pay

## 2012-12-02 ENCOUNTER — Other Ambulatory Visit: Payer: Self-pay | Admitting: Internal Medicine

## 2012-12-02 ENCOUNTER — Other Ambulatory Visit: Payer: Self-pay | Admitting: *Deleted

## 2012-12-02 NOTE — Telephone Encounter (Signed)
Refill for test strips sent to [facility] on [address]

## 2012-12-07 ENCOUNTER — Other Ambulatory Visit: Payer: Self-pay | Admitting: Internal Medicine

## 2012-12-11 NOTE — Telephone Encounter (Signed)
Med filled. Letter mailed to pt.  

## 2012-12-12 ENCOUNTER — Telehealth: Payer: Self-pay | Admitting: General Practice

## 2012-12-12 MED ORDER — GLIMEPIRIDE 2 MG PO TABS: 2.0000 mg | ORAL_TABLET | Freq: Every day | ORAL | Status: DC

## 2012-12-12 NOTE — Telephone Encounter (Signed)
Med filled. Pt needs OV and labs.

## 2012-12-15 ENCOUNTER — Other Ambulatory Visit: Payer: Self-pay | Admitting: Internal Medicine

## 2012-12-16 ENCOUNTER — Other Ambulatory Visit: Payer: Self-pay | Admitting: *Deleted

## 2012-12-16 DIAGNOSIS — E785 Hyperlipidemia, unspecified: Secondary | ICD-10-CM

## 2012-12-16 MED ORDER — PRAVASTATIN SODIUM 40 MG PO TABS: 40.0000 mg | ORAL_TABLET | Freq: Every day | ORAL | Status: DC

## 2012-12-16 NOTE — Telephone Encounter (Signed)
Pt needs follow up appt. Filled for only 30 days.

## 2012-12-16 NOTE — Telephone Encounter (Signed)
Med refilled. OV and labs needed

## 2013-01-07 ENCOUNTER — Ambulatory Visit (INDEPENDENT_AMBULATORY_CARE_PROVIDER_SITE_OTHER): Payer: Medicare Other | Admitting: Internal Medicine

## 2013-01-07 ENCOUNTER — Encounter: Payer: Self-pay | Admitting: Internal Medicine

## 2013-01-07 VITALS — BP 182/81 | HR 64 | Temp 97.9°F | Resp 18 | Wt 211.0 lb

## 2013-01-07 DIAGNOSIS — R7402 Elevation of levels of lactic acid dehydrogenase (LDH): Secondary | ICD-10-CM

## 2013-01-07 DIAGNOSIS — E782 Mixed hyperlipidemia: Secondary | ICD-10-CM

## 2013-01-07 DIAGNOSIS — S72002S Fracture of unspecified part of neck of left femur, sequela: Secondary | ICD-10-CM

## 2013-01-07 DIAGNOSIS — S72009S Fracture of unspecified part of neck of unspecified femur, sequela: Secondary | ICD-10-CM

## 2013-01-07 DIAGNOSIS — R7401 Elevation of levels of liver transaminase levels: Secondary | ICD-10-CM

## 2013-01-07 DIAGNOSIS — E119 Type 2 diabetes mellitus without complications: Secondary | ICD-10-CM

## 2013-01-07 DIAGNOSIS — I10 Essential (primary) hypertension: Secondary | ICD-10-CM

## 2013-01-07 MED ORDER — AMLODIPINE BESYLATE 5 MG PO TABS: 5.0000 mg | ORAL_TABLET | Freq: Every day | ORAL | Status: DC

## 2013-01-07 NOTE — Progress Notes (Signed)
  Subjective:    Patient ID: Donald Carlson, male    DOB: [birth year], 67 y.o.   MRN: [id number]  HPI Preoperative clearance is necessary because of a diagnosis of hypertension, diabetes, and dyslipidemia. Tentatively total hip replacement is scheduled this month. He fractured L hip falling from a ladder 06/18/12; initially 3 screws were placed 4/2  and a bone stimulator applied. At this time scheduling of  total hip replacement is pending this evaluation.   Review of Systems HYPERTENSION: Disease Monitoring: creat 0.9, GFR 84.9 Blood pressure range -141/82- 160/80  Chest pain, palpitations-   no    Dyspnea- occasionally @ day's end Medications compliance-yes  Lightheadedness,Syncope-  no   Edema- no  DIABETES: Disease Monitoring: 08/02/12 A1c 5.9% Blood Sugar - FBS 132 average Polyuria/phagia/dipsia-   no    Visual problems- no Medications compliance- yes Hypoglycemic symptoms- no  HYPERLIPIDEMIA: Disease Monitoring: 06/19/12 LDL 101 ;AST 50; ALT 54   See symptoms for Hypertension Medications compliance- yes Abd pain, bowel changes- no Muscle aches- no       Objective:   Physical Exam Gen.: Healthy and well-nourished in appearance. Alert, appropriate and cooperative throughout exam. Head: Normocephalic without obvious abnormalities  Eyes: No corneal or conjunctival inflammation noted. Pupils equal round reactive to light and accommodation.  Extraocular motion intact.  Nose: External nasal exam reveals no deformity or inflammation. Nasal mucosa are pink and moist. No lesions or exudates noted. Septum to R Mouth: Oral mucosa and oropharynx reveal no lesions or exudates. Dentures in good repair. Neck: No deformities, masses, or tenderness noted.  Thyroid normal. Lungs: Normal respiratory effort; chest expands symmetrically. Lungs are clear to auscultation without rales, wheezes, or increased work of breathing. Heart: Normal rate and rhythm. Normal S1 and S2. No gallop, click, or  rub. S 4w/o murmur. Abdomen: Bowel sounds normal; abdomen soft and nontender. No masses, organomegaly or hernias noted.                                 Musculoskeletal/extremities:  No clubbing, cyanosis, edema, or significant extremity  deformity noted. Range of motion normal .Tone & strength  Normal. Joints  reveal isolated   DJD DIP changes. Nail health good. Able to lie down & sit up with minimal  help. Vascular: Carotid, radial artery, dorsalis pedis and  posterior tibial pulses are full and equal. No bruits present. Neurologic: Alert and oriented x3. Using cane        Skin: Intact without suspicious lesions or rashes. Lymph: No cervical, axillary lymphadenopathy present. Psych: Mood and affect are normal. Normally interactive                                                                                        Assessment & Plan:  See Current Assessment & Plan in Problem List under specific Diagnosis

## 2013-01-07 NOTE — Assessment & Plan Note (Signed)
Surgical clearance pending return of labs

## 2013-01-07 NOTE — Assessment & Plan Note (Addendum)
Recheck BP 165/95, in pain from hip & thumbs BMET Add Amlodipine 5 mg to Coreg 25 mg & Lisinopril 20 mg bid  BP goals discussed

## 2013-01-07 NOTE — Assessment & Plan Note (Signed)
Check AST , ALT

## 2013-01-07 NOTE — Patient Instructions (Signed)

## 2013-01-07 NOTE — Assessment & Plan Note (Signed)
A1c was 5.9% in May of this year. Depending on  value; Glimiperide will be discontinued. Metformin will be held perioperatively. Insulin perioperatively

## 2013-01-08 ENCOUNTER — Encounter: Payer: Self-pay | Admitting: Internal Medicine

## 2013-01-08 ENCOUNTER — Other Ambulatory Visit: Payer: Self-pay | Admitting: Internal Medicine

## 2013-01-08 DIAGNOSIS — E119 Type 2 diabetes mellitus without complications: Secondary | ICD-10-CM

## 2013-01-08 LAB — ALT: ALT: 34 U/L (ref 0–53)

## 2013-01-08 LAB — BASIC METABOLIC PANEL
BUN: 16 mg/dL (ref 6–23)
CO2: 28 mEq/L (ref 19–32)
Calcium: 9.9 mg/dL (ref 8.4–10.5)
Chloride: 106 mEq/L (ref 96–112)
Creatinine, Ser: 1 mg/dL (ref 0.4–1.5)
GFR: 77.2 mL/min (ref >60.00)
Glucose, Bld: 65 mg/dL — ABNORMAL LOW (ref 70–99)
Potassium: 5 mEq/L (ref 3.5–5.1)
Sodium: 142 mEq/L (ref 135–145)

## 2013-01-08 LAB — AST: AST: 30 U/L (ref 0–37)

## 2013-01-08 LAB — HEMOGLOBIN A1C: Hgb A1c MFr Bld: 6.2 % (ref 4.6–6.5)

## 2013-01-08 LAB — MICROALBUMIN / CREATININE URINE RATIO
Creatinine,U: 454.6 mg/dL
Microalb Creat Ratio: 0.6 mg/g (ref 0.0–30.0)
Microalb, Ur: 2.7 mg/dL — ABNORMAL HIGH (ref 0.0–1.9)

## 2013-01-08 MED ORDER — METFORMIN HCL 500 MG PO TABS: ORAL_TABLET | ORAL | Status: DC

## 2013-01-10 ENCOUNTER — Telehealth: Payer: Self-pay | Admitting: Internal Medicine

## 2013-01-10 NOTE — Telephone Encounter (Signed)
Papers faxed to [facility] at [facility] on 01/10/13. [phone]

## 2013-01-10 NOTE — Telephone Encounter (Signed)
I sent note to Dr Shelle Iron through My Chart with clearance & same message to Mr Donald Carlson. Please FAX office visit & lab results to Dr Ermelinda Das office . The note attached to labs clears him for surgery

## 2013-01-10 NOTE — Telephone Encounter (Signed)
Thank You.

## 2013-01-10 NOTE — Telephone Encounter (Signed)
Patient needs his surgery clearance form faxed so that his procedure can be scheduled. He was seen on 10/21 for clearance and was told that form would be faxed Wednesday, 10/22 when labs came back. Please advise.

## 2013-01-11 ENCOUNTER — Encounter: Payer: Self-pay | Admitting: Internal Medicine

## 2013-01-12 ENCOUNTER — Other Ambulatory Visit: Payer: Self-pay | Admitting: Internal Medicine

## 2013-01-13 ENCOUNTER — Other Ambulatory Visit: Payer: Self-pay | Admitting: *Deleted

## 2013-01-13 DIAGNOSIS — E785 Hyperlipidemia, unspecified: Secondary | ICD-10-CM

## 2013-01-13 MED ORDER — PRAVASTATIN SODIUM 40 MG PO TABS: 40.0000 mg | ORAL_TABLET | Freq: Every day | ORAL | Status: DC

## 2013-01-13 NOTE — Telephone Encounter (Signed)
Pravastatin refill sent to pharmacy

## 2013-01-15 ENCOUNTER — Other Ambulatory Visit: Payer: Self-pay | Admitting: Orthopedic Surgery

## 2013-01-15 NOTE — Progress Notes (Signed)
Need orders in EPIC.  Surgery scheduled for 01/30/13.  Thank You.   

## 2013-01-20 ENCOUNTER — Encounter (HOSPITAL_COMMUNITY): Payer: Self-pay | Admitting: Pharmacy Technician

## 2013-01-22 ENCOUNTER — Other Ambulatory Visit (HOSPITAL_COMMUNITY): Payer: Self-pay | Admitting: Specialist

## 2013-01-22 ENCOUNTER — Other Ambulatory Visit: Payer: Self-pay | Admitting: Orthopedic Surgery

## 2013-01-22 NOTE — Patient Instructions (Addendum)
20 JAMEEL QUANT  01/22/2013   Your procedure is scheduled on:  01/30/13  Thursday  Report to [facility] at  0930     AM.  Call this number if you have problems the morning of surgery: [phone]       Remember: DO NOT TAKE ANY BLOOD SUGAR MEDICATION MORNING OF SURGERY  Do not eat food  Or drink :After Midnight. Wednesday NIGHT   Take these medicines the morning of surgery with A SIP OF WATER: AMLODIPINE, CARVEDILOL                                        MAY TAKE NORCO IF NEEDED FOR PAIN   .  Contacts, dentures or partial plates can not be worn to surgery  Leave suitcase in the car. After surgery it may be brought to your room.  For patients admitted to the hospital, checkout time is 11:00 AM day of  discharge.             SPECIAL INSTRUCTIONS- SEE [place] PREPARING FOR SURGERY INSTRUCTION SHEET-     DO NOT WEAR JEWELRY, LOTIONS, POWDERS, OR PERFUMES.  WOMEN-- DO NOT SHAVE LEGS OR UNDERARMS FOR 12 HOURS BEFORE SHOWERS. MEN MAY SHAVE FACE.  Patients discharged the day of surgery will not be allowed to drive home. IF going home the day of surgery, you must have a driver and someone to stay with you for the first 24 hours  Name and phone number of your driver:   ADMISSION                                                                     Please read over the following fact sheets that you were given: MRSA Information, Incentive Spirometry Sheet, Blood Transfusion Sheet  Information                                                                                   Raif Chachere  PST 336  [phone]                 FAILURE TO FOLLOW THESE INSTRUCTIONS MAY RESULT IN  CANCELLATION   OF YOUR SURGERY                                                  Patient Signature _____________________________

## 2013-01-22 NOTE — H&P (Signed)
Donald Carlson DOB: 11/24/1945 Male  H&P date: 01/21/13  Chief Complaint: L hip pain  History of Present Illness The patient is a 67 year old male who comes in today for a preoperative History and Physical. The patient is scheduled for a removal of hardware left hip and hemiarthroplasty vs total hip arthroplasty to be performed by Dr. Jeffrey C. Beane, MD at [facility] on 01/30/2013 . Please see the hospital record for complete dictated history and physical. Donald Carlson is now 7 months s/p left femoral neck fx sustained from a work-related injury, s/p ORIF with cannulated screws on 06/20/12. 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  Given the duration of the nonunion and his symptoms, we discussed conversion to total hip or hemiarthroplasty. He has reported no symptoms prior to that. He has a relatively well maintained joint space. With his age and comorbidities, he is a possible candidate for a hemi. 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 We discussed at least a porous-coated hemi. and intraoperative evaluation of the acetabulum and, if significant degenerative changes are noted, total hip. He does have other comorbidities, anemia, gout, noninsulin-dependent diabetes. There is significant coronary artery risk in his family. We discussed postoperative leg length discrepancy, DVT, PE, partial weight bearing for 6 weeks then full weight bearing and, if hemi. is performed, conversion to a total hip. Given his age and comorbidities, that may not be an option. We discussed the complications of a dislocation and post-op. course and 2-3 days in the hospital, preop. clearance which I would suggest again (received from Dr. Hopper). He is on Vitamin D as well. Continue with calcium, weight bearing with the crutch, out of work.  Past Medical Hx Kidney  Stone Hypercholesterolemia Skin Cancer Gout Anemia High blood pressure Cancer. bladder Diabetes Mellitus, Type II Bronchitis Pneumonia Dentures  Allergies No Known Drug Allergies. 07/03/2012  Family History Drug / Alcohol Addiction. brother Heart Disease. mother Kidney disease. father Cancer. sister and brother Diabetes Mellitus. mother Congestive Heart Failure. First Degree Relatives. mother Hypertension. mother  Social History Tobacco use. Never smoker. never smoker Drug/Alcohol Rehab (Currently). no Alcohol use. former drinker Pain Contract. yes Number of flights of stairs before winded. greater than 5 Marital status. married Illicit drug use. no Children. 1 Living situation. live with spouse, one story home, 4 steps to enter Drug/Alcohol Rehab (Previously). no Post-Surgical Plans. home with HHPT. Has walker, crutches, cane Advance Directives. none  Medication History Norco (7.5-325MG Tablet, Oral) Active. Carvedilol (25MG Tablet, Oral) Active. MetFORMIN HCl (500MG Tablet, Oral) Active. Pravastatin Sodium (40MG Tablet, Oral) Active. Betoptic-S (0.25% Suspension, Ophthalmic) Active. Lisinopril (20MG Tablet, Oral) Active. Flaxseed Oil ( Oral) Specific dose unknown - Active. Saw Palmetto ( Oral) Specific dose unknown - Active. Vitamin D ( Oral) Specific dose unknown - Active. AmLODIPine Besylate (5MG Tablet, Oral) Active. Nascobal ( Nasal) Specific dose unknown - Active. Aspirin (325MG Tablet, 1 (one) Oral BID) Active. Medications Reconciled.  Past Surgical History Shoulder Surgery Appendectomy Back Surgery  Review of Systems([name], PA-C; 01/22/2013 12:16 PM) General:Not Present- Chills, Fever, Night Sweats, Fatigue, Weight Gain, Weight Loss and Memory Loss. Skin:Not Present- Hives, Itching, Rash, Eczema and Lesions. HEENT:Present- Dentures. Not Present- Tinnitus, Headache, Double Vision, Visual Loss and Hearing  Loss. Respiratory:Not Present- Shortness of breath with exertion, Shortness of breath at rest, Allergies, Coughing up blood and Chronic Cough. Cardiovascular:Not Present- Chest   Pain, Racing/skipping heartbeats, Difficulty Breathing Lying Down, Murmur, Swelling and Palpitations. Gastrointestinal:Not Present- Bloody Stool, Heartburn, Abdominal Pain, Vomiting, Nausea, Constipation, Diarrhea, Difficulty Swallowing, Jaundice and Loss of appetitie. Male Genitourinary:Not Present- Urinary frequency, Blood in Urine, Weak urinary stream, Discharge, Flank Pain, Incontinence, Painful Urination, Urgency, Urinary Retention and Urinating at Night. Musculoskeletal:Present- Joint Pain and Morning Stiffness. Not Present- Muscle Weakness, Muscle Pain, Joint Swelling, Back Pain and Spasms. Neurological:Not Present- Tremor, Dizziness, Blackout spells, Paralysis, Difficulty with balance and Weakness. Psychiatric:Not Present- Insomnia.  Vitals 01/21/2013 2:10 PM BP: 166/82 (Sitting, Left Arm, Standard)  Physical Exam The physical exam findings are as follows:  General Mental Status - Alert, cooperative and good historian. General Appearance- pleasant. Not in acute distress. Orientation- Oriented X3. Build & Nutrition- Well nourished and Well developed.  Head and Neck Head- normocephalic, atraumatic . Neck Global Assessment- supple. no bruit auscultated on the right and no bruit auscultated on the left.  Eye Pupil- Bilateral- Regular and Round. Motion- Bilateral- EOMI.  Chest and Lung Exam Auscultation: Breath sounds:- clear at anterior chest wall and - clear at posterior chest wall. Adventitious sounds:- No Adventitious sounds.  Cardiovascular Auscultation:Rhythm- Regular rate and rhythm. Heart Sounds- S1 WNL and S2 WNL. Murmurs & Other Heart Sounds:Auscultation of the heart reveals - No Murmurs.  Abdomen Palpation/Percussion:Tenderness- Abdomen is non-tender to  palpation. Rigidity (guarding)- Abdomen is soft. Auscultation:Auscultation of the abdomen reveals - Bowel sounds normal.  Male Genitourinary Not done, not pertinent to present illness  Musculoskeletal Still having pain in his groin. On exam he walks with an antalgic gait with a crutch. With ROM of the hip, he has pain within the hip itself into the groin, limiting his ambulation. Neurologically he is intact. Lumbar spine is normal outside of the hip.  Lumbar spine exam reveals no evidence of soft tissue swelling, ecchymosis or deformity. The abdomen is soft and nontender. Nontender over the trochanters. No cellulitis or lymphadenopathy.  Good range of motion of the lumbar spine without associated pain. Straight leg raise is negative. Motor is 5/5 including EHL, tibialis anterior, plantar flexion, quadriceps and hamstrings. Patient is normoreflexic. There is no Babinski or clonus. Sensory exam is intact to light touch. The patient has good distal pulses. No DVT. No pain and normal range of motion without instability of the hips, knees and ankles.  He has good painless ROM of the cervical spine.  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 There has been no interval change since the previous. He has mild to moderate degenerative changes of the hip.  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 He is scheduled for removal of hardware and conversion to hemi vs THA by Dr. Beane on 01/30/13. Discussed the procedure itself as well as risks, complications, and alternatives including but not limited to DVT, PE, infx, bleeding, failure of procedure, need for secondary procedure, anesthesia risk, nerve injury, scarring, even death. Discussed expected post-operative outcomes, protocols, PT, posterior hip precautions, activity modifications and limitations, expected hospital stay, DVT and  dental ppx. Discussed post-operative dislocation risk and prevention. All his questions were answered and he desires to proceed. He has been cleared by his PCP Dr. Hopper. He will have his pre-op labs at [facility] as scheduled 11/6. Will remain NPO after MN the night before surgery. Will hold ASA, supplements pre-op accordingly. Plan for home with HHPT post-op. He will schedule follow   up for 10-14 days post-op for staple removal and will call with any questions or concerns in the interim.  Plan left hip removal of hardware and conversion to hemiarthroplasty vs. Total hip replacement  Signed electronically by [name], PA-C for Dr. Beane     

## 2013-01-22 NOTE — Progress Notes (Signed)
EKG, chest x ray 4/14 EPIC

## 2013-01-23 ENCOUNTER — Encounter (HOSPITAL_COMMUNITY)
Admission: RE | Admit: 2013-01-23 | Discharge: 2013-01-23 | Disposition: A | Discharge status: Home / Self Care | Payer: Worker's Compensation | Source: Ambulatory Visit | Attending: Specialist | Admitting: Specialist

## 2013-01-23 ENCOUNTER — Other Ambulatory Visit: Payer: Self-pay

## 2013-01-23 ENCOUNTER — Ambulatory Visit (HOSPITAL_COMMUNITY)
Admission: RE | Admit: 2013-01-23 | Discharge: 2013-01-23 | Disposition: A | Discharge status: Home / Self Care | Payer: Worker's Compensation | Source: Ambulatory Visit | Attending: Orthopedic Surgery | Admitting: Orthopedic Surgery

## 2013-01-23 ENCOUNTER — Encounter (HOSPITAL_COMMUNITY): Payer: Self-pay

## 2013-01-23 DIAGNOSIS — Z01818 Encounter for other preprocedural examination: Secondary | ICD-10-CM | POA: Insufficient documentation

## 2013-01-23 DIAGNOSIS — Z01812 Encounter for preprocedural laboratory examination: Secondary | ICD-10-CM | POA: Insufficient documentation

## 2013-01-23 HISTORY — DX: Pneumonia, unspecified organism: J18.9

## 2013-01-23 HISTORY — DX: Gout, unspecified: M10.9

## 2013-01-23 LAB — CBC
HCT: 39.3 % (ref 39.0–52.0)
Hemoglobin: 13.2 g/dL (ref 13.0–17.0)
MCH: 28.7 pg (ref 26.0–34.0)
MCHC: 33.6 g/dL (ref 30.0–36.0)
MCV: 85.4 fL (ref 78.0–100.0)
Platelets: 179 10*3/uL (ref 150–400)
RBC: 4.6 MIL/uL (ref 4.22–5.81)
RDW: 13 % (ref 11.5–15.5)
WBC: 9.4 10*3/uL (ref 4.0–10.5)

## 2013-01-23 LAB — URINALYSIS, ROUTINE W REFLEX MICROSCOPIC
Glucose, UA: NEGATIVE mg/dL
Hgb urine dipstick: NEGATIVE
Ketones, ur: NEGATIVE mg/dL
Leukocytes, UA: NEGATIVE
Nitrite: NEGATIVE
Protein, ur: NEGATIVE mg/dL
Specific Gravity, Urine: 1.029 (ref 1.005–1.030)
Urobilinogen, UA: 1 mg/dL (ref 0.0–1.0)
pH: 6.5 (ref 5.0–8.0)

## 2013-01-23 LAB — PROTIME-INR
INR: 0.96 (ref 0.00–1.49)
Prothrombin Time: 12.6 seconds (ref 11.6–15.2)

## 2013-01-23 LAB — BASIC METABOLIC PANEL
BUN: 21 mg/dL (ref 6–23)
CO2: 27 mEq/L (ref 19–32)
Calcium: 10.1 mg/dL (ref 8.4–10.5)
Chloride: 99 mEq/L (ref 96–112)
Creatinine, Ser: 1.12 mg/dL (ref 0.50–1.35)
GFR calc Af Amer: 76 mL/min — ABNORMAL LOW (ref >90)
GFR calc non Af Amer: 66 mL/min — ABNORMAL LOW (ref >90)
Glucose, Bld: 236 mg/dL — ABNORMAL HIGH (ref 70–99)
Potassium: 4.4 mEq/L (ref 3.5–5.1)
Sodium: 136 mEq/L (ref 135–145)

## 2013-01-23 LAB — APTT: aPTT: 32 seconds (ref 24–37)

## 2013-01-23 LAB — SURGICAL PCR SCREEN
MRSA, PCR: NEGATIVE
Staphylococcus aureus: NEGATIVE

## 2013-01-23 NOTE — Progress Notes (Signed)
Faxed BMET, U/A to Dr Shelle Iron through [facility]

## 2013-01-27 ENCOUNTER — Other Ambulatory Visit: Payer: Self-pay | Admitting: Orthopedic Surgery

## 2013-01-30 ENCOUNTER — Encounter (HOSPITAL_COMMUNITY)
Admission: RE | Disposition: A | Discharge status: Home Health | Payer: Self-pay | Source: Ambulatory Visit | Attending: Specialist

## 2013-01-30 ENCOUNTER — Encounter (HOSPITAL_COMMUNITY): Payer: Self-pay | Admitting: *Deleted

## 2013-01-30 ENCOUNTER — Inpatient Hospital Stay (HOSPITAL_COMMUNITY)
Admission: RE | Admit: 2013-01-30 | Discharge: 2013-02-02 | DRG: 470 | Disposition: A | Discharge status: Home Health | Payer: Worker's Compensation | Source: Ambulatory Visit | Attending: Specialist | Admitting: Specialist

## 2013-01-30 ENCOUNTER — Inpatient Hospital Stay (HOSPITAL_COMMUNITY): Payer: Worker's Compensation

## 2013-01-30 ENCOUNTER — Encounter (HOSPITAL_COMMUNITY): Payer: Worker's Compensation | Admitting: Anesthesiology

## 2013-01-30 ENCOUNTER — Inpatient Hospital Stay (HOSPITAL_COMMUNITY): Payer: Worker's Compensation | Admitting: Anesthesiology

## 2013-01-30 DIAGNOSIS — I10 Essential (primary) hypertension: Secondary | ICD-10-CM | POA: Diagnosis present

## 2013-01-30 DIAGNOSIS — Z85820 Personal history of malignant melanoma of skin: Secondary | ICD-10-CM

## 2013-01-30 DIAGNOSIS — E119 Type 2 diabetes mellitus without complications: Secondary | ICD-10-CM | POA: Diagnosis present

## 2013-01-30 DIAGNOSIS — IMO0002 Reserved for concepts with insufficient information to code with codable children: Principal | ICD-10-CM | POA: Diagnosis present

## 2013-01-30 DIAGNOSIS — S72142A Displaced intertrochanteric fracture of left femur, initial encounter for closed fracture: Secondary | ICD-10-CM

## 2013-01-30 DIAGNOSIS — Z6829 Body mass index (BMI) 29.0-29.9, adult: Secondary | ICD-10-CM

## 2013-01-30 DIAGNOSIS — Z8551 Personal history of malignant neoplasm of bladder: Secondary | ICD-10-CM

## 2013-01-30 DIAGNOSIS — E785 Hyperlipidemia, unspecified: Secondary | ICD-10-CM | POA: Diagnosis present

## 2013-01-30 DIAGNOSIS — M129 Arthropathy, unspecified: Secondary | ICD-10-CM | POA: Diagnosis present

## 2013-01-30 HISTORY — PX: TOTAL HIP ARTHROPLASTY: SHX124

## 2013-01-30 LAB — GRAM STAIN

## 2013-01-30 LAB — TYPE AND SCREEN
ABO/RH(D): B POS
Antibody Screen: NEGATIVE

## 2013-01-30 LAB — GLUCOSE, CAPILLARY
Glucose-Capillary: 137 mg/dL — ABNORMAL HIGH (ref 70–99)
Glucose-Capillary: 120 mg/dL — ABNORMAL HIGH (ref 70–99)
Glucose-Capillary: 114 mg/dL — ABNORMAL HIGH (ref 70–99)
Glucose-Capillary: 122 mg/dL — ABNORMAL HIGH (ref 70–99)

## 2013-01-30 SURGERY — ARTHROPLASTY, HIP, TOTAL,POSTERIOR APPROACH
Anesthesia: General | Site: Hip | Laterality: Left | Wound class: Clean

## 2013-01-30 MED ORDER — CARVEDILOL 25 MG PO TABS
25.0000 mg | ORAL_TABLET | Freq: Two times a day (BID) | ORAL | Status: DC
  Administered 2013-01-31 – 2013-02-02 (×5): 25 mg via ORAL
  Filled 2013-01-30 (×7): qty 1

## 2013-01-30 MED ORDER — HYDROMORPHONE HCL PF 1 MG/ML IJ SOLN
INTRAMUSCULAR | Status: AC
  Filled 2013-01-30: qty 1

## 2013-01-30 MED ORDER — GLYCOPYRROLATE 0.2 MG/ML IJ SOLN
INTRAMUSCULAR | Status: DC | PRN
  Administered 2013-01-30: .4 mg via INTRAVENOUS

## 2013-01-30 MED ORDER — LACTATED RINGERS IV SOLN
INTRAVENOUS | Status: DC
  Administered 2013-01-30 (×2): via INTRAVENOUS

## 2013-01-30 MED ORDER — EPHEDRINE SULFATE 50 MG/ML IJ SOLN
INTRAMUSCULAR | Status: DC | PRN
  Administered 2013-01-30: 15 mg via INTRAVENOUS

## 2013-01-30 MED ORDER — CISATRACURIUM BESYLATE (PF) 10 MG/5ML IV SOLN
INTRAVENOUS | Status: DC | PRN
  Administered 2013-01-30: 3 mg via INTRAVENOUS
  Administered 2013-01-30: 4 mg via INTRAVENOUS
  Administered 2013-01-30: 3 mg via INTRAVENOUS
  Administered 2013-01-30: 10 mg via INTRAVENOUS

## 2013-01-30 MED ORDER — BETAXOLOL HCL 0.25 % OP SUSP
1.0000 [drp] | Freq: Two times a day (BID) | OPHTHALMIC | Status: DC
  Administered 2013-01-30 – 2013-02-02 (×6): 1 [drp] via OPHTHALMIC
  Filled 2013-01-30: qty 10

## 2013-01-30 MED ORDER — ONDANSETRON HCL 4 MG PO TABS
4.0000 mg | ORAL_TABLET | Freq: Four times a day (QID) | ORAL | Status: DC | PRN
  Administered 2013-02-01: 4 mg via ORAL
  Filled 2013-01-30 (×2): qty 1

## 2013-01-30 MED ORDER — METHOCARBAMOL 500 MG PO TABS: 500.0000 mg | ORAL_TABLET | Freq: Three times a day (TID) | ORAL | Status: DC | PRN

## 2013-01-30 MED ORDER — CLINDAMYCIN PHOSPHATE 600 MG/50ML IV SOLN
600.0000 mg | Freq: Three times a day (TID) | INTRAVENOUS | Status: AC
  Administered 2013-01-30 – 2013-01-31 (×2): 600 mg via INTRAVENOUS
  Filled 2013-01-30 (×2): qty 50

## 2013-01-30 MED ORDER — HYDROMORPHONE HCL PF 1 MG/ML IJ SOLN
0.2500 mg | INTRAMUSCULAR | Status: DC | PRN
  Administered 2013-01-30 (×3): 0.5 mg via INTRAVENOUS

## 2013-01-30 MED ORDER — METHOCARBAMOL 500 MG PO TABS
500.0000 mg | ORAL_TABLET | Freq: Four times a day (QID) | ORAL | Status: DC | PRN
  Administered 2013-01-30 – 2013-02-01 (×5): 500 mg via ORAL
  Filled 2013-01-30 (×5): qty 1

## 2013-01-30 MED ORDER — ACETAMINOPHEN 650 MG RE SUPP: 650.0000 mg | Freq: Four times a day (QID) | RECTAL | Status: DC | PRN

## 2013-01-30 MED ORDER — OXYCODONE-ACETAMINOPHEN 5-325 MG PO TABS: 1.0000 | ORAL_TABLET | ORAL | Status: DC | PRN

## 2013-01-30 MED ORDER — BUPIVACAINE-EPINEPHRINE PF 0.5-1:200000 % IJ SOLN
INTRAMUSCULAR | Status: AC
  Filled 2013-01-30: qty 30

## 2013-01-30 MED ORDER — OXYCODONE HCL 5 MG PO TABS
5.0000 mg | ORAL_TABLET | ORAL | Status: DC | PRN
  Administered 2013-01-30: 5 mg via ORAL
  Administered 2013-01-30 – 2013-01-31 (×7): 10 mg via ORAL
  Administered 2013-02-01: 5 mg via ORAL
  Administered 2013-02-01 – 2013-02-02 (×2): 10 mg via ORAL
  Filled 2013-01-30 (×4): qty 2
  Filled 2013-01-30: qty 1
  Filled 2013-01-30 (×8): qty 2

## 2013-01-30 MED ORDER — METOCLOPRAMIDE HCL 5 MG/ML IJ SOLN: 5.0000 mg | Freq: Three times a day (TID) | INTRAMUSCULAR | Status: DC | PRN

## 2013-01-30 MED ORDER — CEFAZOLIN SODIUM-DEXTROSE 2-3 GM-% IV SOLR
INTRAVENOUS | Status: AC
  Filled 2013-01-30: qty 50

## 2013-01-30 MED ORDER — ONDANSETRON HCL 4 MG/2ML IJ SOLN
INTRAMUSCULAR | Status: DC | PRN
  Administered 2013-01-30 (×2): 2 mg via INTRAVENOUS

## 2013-01-30 MED ORDER — MENTHOL 3 MG MT LOZG: 1.0000 | LOZENGE | OROMUCOSAL | Status: DC | PRN

## 2013-01-30 MED ORDER — FENTANYL CITRATE 0.05 MG/ML IJ SOLN
INTRAMUSCULAR | Status: DC | PRN
  Administered 2013-01-30 (×2): 50 ug via INTRAVENOUS
  Administered 2013-01-30: 150 ug via INTRAVENOUS

## 2013-01-30 MED ORDER — METHOCARBAMOL 100 MG/ML IJ SOLN
500.0000 mg | Freq: Four times a day (QID) | INTRAVENOUS | Status: DC | PRN
  Administered 2013-01-30: 500 mg via INTRAVENOUS
  Filled 2013-01-30: qty 5

## 2013-01-30 MED ORDER — SODIUM CHLORIDE 0.9 % IR SOLN
Status: DC | PRN
  Administered 2013-01-30: 13:00:00

## 2013-01-30 MED ORDER — PHENOL 1.4 % MT LIQD: 1.0000 | OROMUCOSAL | Status: DC | PRN

## 2013-01-30 MED ORDER — HYDROMORPHONE HCL PF 1 MG/ML IJ SOLN: 1.0000 mg | INTRAMUSCULAR | Status: DC | PRN

## 2013-01-30 MED ORDER — ACETAMINOPHEN 325 MG PO TABS: 650.0000 mg | ORAL_TABLET | Freq: Four times a day (QID) | ORAL | Status: DC | PRN

## 2013-01-30 MED ORDER — HYDROMORPHONE HCL PF 1 MG/ML IJ SOLN
INTRAMUSCULAR | Status: DC | PRN
  Administered 2013-01-30 (×2): 1 mg via INTRAVENOUS

## 2013-01-30 MED ORDER — RIVAROXABAN 10 MG PO TABS: 10.0000 mg | ORAL_TABLET | Freq: Every day | ORAL | Status: DC

## 2013-01-30 MED ORDER — CEFAZOLIN SODIUM-DEXTROSE 2-3 GM-% IV SOLR
2.0000 g | INTRAVENOUS | Status: AC
  Administered 2013-01-30: 2 g via INTRAVENOUS

## 2013-01-30 MED ORDER — LACTATED RINGERS IV SOLN: INTRAVENOUS | Status: DC

## 2013-01-30 MED ORDER — RIVAROXABAN 10 MG PO TABS
10.0000 mg | ORAL_TABLET | Freq: Every day | ORAL | Status: DC
  Administered 2013-01-31 – 2013-02-02 (×3): 10 mg via ORAL
  Filled 2013-01-30 (×4): qty 1

## 2013-01-30 MED ORDER — MIDAZOLAM HCL 5 MG/5ML IJ SOLN
INTRAMUSCULAR | Status: DC | PRN
  Administered 2013-01-30: 2 mg via INTRAVENOUS

## 2013-01-30 MED ORDER — ONDANSETRON HCL 4 MG/2ML IJ SOLN: 4.0000 mg | Freq: Four times a day (QID) | INTRAMUSCULAR | Status: DC | PRN

## 2013-01-30 MED ORDER — METOCLOPRAMIDE HCL 10 MG PO TABS: 5.0000 mg | ORAL_TABLET | Freq: Three times a day (TID) | ORAL | Status: DC | PRN

## 2013-01-30 MED ORDER — POTASSIUM CHLORIDE IN NACL 20-0.9 MEQ/L-% IV SOLN
INTRAVENOUS | Status: AC
  Administered 2013-01-31: 02:00:00 via INTRAVENOUS
  Filled 2013-01-30 (×2): qty 1000

## 2013-01-30 MED ORDER — KETAMINE HCL 10 MG/ML IJ SOLN
INTRAMUSCULAR | Status: DC | PRN
  Administered 2013-01-30 (×2): 10 mg via INTRAVENOUS

## 2013-01-30 MED ORDER — LIDOCAINE HCL (CARDIAC) 20 MG/ML IV SOLN
INTRAVENOUS | Status: DC | PRN
  Administered 2013-01-30: 100 mg via INTRAVENOUS

## 2013-01-30 MED ORDER — PROPOFOL 10 MG/ML IV BOLUS
INTRAVENOUS | Status: DC | PRN
  Administered 2013-01-30: 200 mg via INTRAVENOUS

## 2013-01-30 MED ORDER — LISINOPRIL 20 MG PO TABS
20.0000 mg | ORAL_TABLET | Freq: Two times a day (BID) | ORAL | Status: DC
  Administered 2013-01-30 – 2013-02-02 (×4): 20 mg via ORAL
  Filled 2013-01-30 (×7): qty 1

## 2013-01-30 MED ORDER — METFORMIN HCL 500 MG PO TABS
500.0000 mg | ORAL_TABLET | Freq: Every day | ORAL | Status: DC
  Administered 2013-01-31 – 2013-02-01 (×2): 500 mg via ORAL
  Filled 2013-01-30 (×3): qty 1

## 2013-01-30 MED ORDER — INSULIN ASPART 100 UNIT/ML ~~LOC~~ SOLN
0.0000 [IU] | Freq: Three times a day (TID) | SUBCUTANEOUS | Status: DC
  Administered 2013-01-31 – 2013-02-02 (×4): 2 [IU] via SUBCUTANEOUS

## 2013-01-30 MED ORDER — CEFAZOLIN SODIUM-DEXTROSE 2-3 GM-% IV SOLR
2.0000 g | Freq: Four times a day (QID) | INTRAVENOUS | Status: AC
  Administered 2013-01-30 (×2): 2 g via INTRAVENOUS
  Filled 2013-01-30 (×2): qty 50

## 2013-01-30 MED ORDER — NEOSTIGMINE METHYLSULFATE 1 MG/ML IJ SOLN
INTRAMUSCULAR | Status: DC | PRN
  Administered 2013-01-30: 3 mg via INTRAVENOUS

## 2013-01-30 SURGICAL SUPPLY — 54 items
BAG SPEC THK2 15X12 ZIP CLS (MISCELLANEOUS) ×1
BAG ZIPLOCK 12X15 (MISCELLANEOUS) ×2 IMPLANT
BLADE SAW SAG 73X25 THK (BLADE) ×1
BLADE SAW SGTL 73X25 THK (BLADE) ×1 IMPLANT
CAPT HIP HD POR BIPOL/UNIPOL ×1 IMPLANT
CLOTH 2% CHLOROHEXIDINE 3PK (PERSONAL CARE ITEMS) ×2 IMPLANT
DRAPE INCISE IOBAN 66X45 STRL (DRAPES) IMPLANT
DRAPE INCISE IOBAN 85X60 (DRAPES) ×2 IMPLANT
DRAPE ORTHO SPLIT 77X108 STRL (DRAPES) ×4
DRAPE POUCH INSTRU U-SHP 10X18 (DRAPES) ×2 IMPLANT
DRAPE SURG 17X11 SM STRL (DRAPES) ×1 IMPLANT
DRAPE SURG ORHT 6 SPLT 77X108 (DRAPES) ×2 IMPLANT
DRAPE U-SHAPE 47X51 STRL (DRAPES) ×2 IMPLANT
DRSG AQUACEL AG ADV 3.5X10 (GAUZE/BANDAGES/DRESSINGS) ×1 IMPLANT
DRSG EMULSION OIL 3X16 NADH (GAUZE/BANDAGES/DRESSINGS) ×1 IMPLANT
DRSG MEPILEX BORDER 4X8 (GAUZE/BANDAGES/DRESSINGS) ×1 IMPLANT
DRSG PAD ABDOMINAL 8X10 ST (GAUZE/BANDAGES/DRESSINGS) IMPLANT
DURAPREP 26ML APPLICATOR (WOUND CARE) ×2 IMPLANT
ELECT BLADE TIP CTD 4 INCH (ELECTRODE) ×2 IMPLANT
ELECT REM PT RETURN 9FT ADLT (ELECTROSURGICAL) ×2
ELECTRODE REM PT RTRN 9FT ADLT (ELECTROSURGICAL) ×1 IMPLANT
EVACUATOR 1/8 PVC DRAIN (DRAIN) ×1 IMPLANT
FACESHIELD LNG OPTICON STERILE (SAFETY) ×9 IMPLANT
GLOVE BIOGEL PI IND STRL 7.5 (GLOVE) ×1 IMPLANT
GLOVE BIOGEL PI IND STRL 8 (GLOVE) ×1 IMPLANT
GLOVE BIOGEL PI INDICATOR 7.5 (GLOVE) ×1
GLOVE BIOGEL PI INDICATOR 8 (GLOVE) ×1
GLOVE SURG SS PI 7.5 STRL IVOR (GLOVE) ×2 IMPLANT
GLOVE SURG SS PI 8.0 STRL IVOR (GLOVE) ×2 IMPLANT
GOWN STRL REIN XL XLG (GOWN DISPOSABLE) ×5 IMPLANT
IMMOBILIZER KNEE 20 (SOFTGOODS) ×2
IMMOBILIZER KNEE 20 THIGH 36 (SOFTGOODS) IMPLANT
KIT BASIN OR (CUSTOM PROCEDURE TRAY) ×2 IMPLANT
MANIFOLD NEPTUNE II (INSTRUMENTS) ×2 IMPLANT
NS IRRIG 1000ML POUR BTL (IV SOLUTION) ×1 IMPLANT
PACK TOTAL JOINT (CUSTOM PROCEDURE TRAY) ×2 IMPLANT
PASSER SUT SWANSON 36MM LOOP (INSTRUMENTS) ×1 IMPLANT
POSITIONER SURGICAL ARM (MISCELLANEOUS) ×2 IMPLANT
SPONGE GAUZE 4X4 12PLY (GAUZE/BANDAGES/DRESSINGS) ×1 IMPLANT
SPONGE LAP 18X18 X RAY DECT (DISPOSABLE) ×1 IMPLANT
SPONGE LAP 4X18 X RAY DECT (DISPOSABLE) ×1 IMPLANT
STAPLER VISISTAT (STAPLE) ×2 IMPLANT
SUT ETHIBOND NAB CT1 #1 30IN (SUTURE) ×7 IMPLANT
SUT VIC AB 0 CT1 27 (SUTURE)
SUT VIC AB 0 CT1 27XBRD ANTBC (SUTURE) ×3 IMPLANT
SUT VIC AB 1 CT1 27 (SUTURE) ×2
SUT VIC AB 1 CT1 27XBRD ANTBC (SUTURE) ×4 IMPLANT
SUT VIC AB 2-0 CT1 27 (SUTURE) ×6
SUT VIC AB 2-0 CT1 TAPERPNT 27 (SUTURE) ×3 IMPLANT
SUT VLOC 180 0 24IN GS25 (SUTURE) ×2 IMPLANT
SWAB COLLECTION DEVICE MRSA (MISCELLANEOUS) ×1 IMPLANT
TRAY FOLEY METER SIL LF 16FR (CATHETERS) ×1 IMPLANT
TUBE ANAEROBIC SPECIMEN COL (MISCELLANEOUS) ×1 IMPLANT
WATER STERILE IRR 1500ML POUR (IV SOLUTION) ×2 IMPLANT

## 2013-01-30 NOTE — Transfer of Care (Signed)
Immediate Anesthesia Transfer of Care Note  Patient: Donald Carlson  Procedure(s) Performed: Procedure(s): REMOVAL OF HARDWARE, LEFT HIP HEMIARTHROPLASTY (Left)  Patient Location: PACU  Anesthesia Type:General  Level of Consciousness: sedated and patient cooperative  Airway & Oxygen Therapy: Patient Spontanous Breathing and Patient connected to face mask oxygen  Post-op Assessment: Report given to PACU RN and Post -op Vital signs reviewed and stable  Post vital signs: stable  Complications: No apparent anesthesia complications

## 2013-01-30 NOTE — Anesthesia Preprocedure Evaluation (Signed)
Anesthesia Evaluation  Patient identified by MRN, date of birth, ID band Patient awake    Reviewed: Allergy & Precautions, H&P , NPO status , Patient's Chart, lab work & pertinent test results, reviewed documented beta blocker date and time   Airway Mallampati: II  TM Distance: >3 FB Neck ROM: full    Dental no notable dental hx.    Pulmonary neg pulmonary ROS,  breath sounds clear to auscultation  Pulmonary exam normal       Cardiovascular hypertension, Pt. on home beta blockers and Pt. on medications Rhythm:regular Rate:Normal     Neuro/Psych Glaucoma.  Back surgery negative neurological ROS  negative psych ROS   GI/Hepatic negative GI ROS, Neg liver ROS,   Endo/Other  diabetes, Well Controlled, Type 2, Oral Hypoglycemic Agents  Renal/GU negative Renal ROS  negative genitourinary   Musculoskeletal   Abdominal   Peds  Hematology negative hematology ROS (+)   Anesthesia Other Findings   Reproductive/Obstetrics negative OB ROS                             Anesthesia Physical  Anesthesia Plan  ASA: III  Anesthesia Plan: General   Post-op Pain Management:    Induction: Intravenous  Airway Management Planned: Oral ETT  Additional Equipment:   Intra-op Plan:   Post-operative Plan: Extubation in OR  Informed Consent: I have reviewed the patients History and Physical, chart, labs and discussed the procedure including the risks, benefits and alternatives for the proposed anesthesia with the patient or authorized representative who has indicated his/her understanding and acceptance.   Dental Advisory Given  Plan Discussed with: CRNA and Surgeon  Anesthesia Plan Comments:         Anesthesia Quick Evaluation  

## 2013-01-30 NOTE — Brief Op Note (Signed)
01/30/2013  1:50 PM  PATIENT:  Donald Carlson  67 y.o. male  PRE-OPERATIVE DIAGNOSIS:  NON-UNION LEFT HIP FRACTURE  POST-OPERATIVE DIAGNOSIS:  NON-UNION LEFT HIP FRACTURE  PROCEDURE:  Procedure(s): REMOVAL OF HARDWARE, LEFT HIP HEMIARTHROPLASTY (Left)  SURGEON:  Surgeon(s) and Role:    * Javier Docker, MD - Primary  PHYSICIAN ASSISTANT:   ASSISTANTS: Bissell   ANESTHESIA:   general  EBL:  Total I/O In: 1000 [I.V.:1000] Out: 425 [Urine:125; Blood:300]  BLOOD ADMINISTERED:none  DRAINS: none   LOCAL MEDICATIONS USED:  MARCAINE     SPECIMEN:  No Specimen  DISPOSITION OF SPECIMEN:  N/A  COUNTS:  YES  TOURNIQUET:  * No tourniquets in log *  DICTATION: .Other Dictation: Dictation Number   [id number]  PLAN OF CARE: Admit to inpatient   PATIENT DISPOSITION:  PACU - hemodynamically stable.   Delay start of Pharmacological VTE agent (>24hrs) due to surgical blood loss or risk of bleeding: no

## 2013-01-30 NOTE — Plan of Care (Signed)
Problem: Consults Goal: Diagnosis- Total Joint Replacement Outcome: Completed/Met Date Met:  01/30/13 Hemiarthroplasty

## 2013-01-30 NOTE — H&P (View-Only) (Signed)
Donald Carlson DOB: 1946-03-02 Male  H&P date: 01/21/13  Chief Complaint: L hip pain  History of Present Illness The patient is a 67 year old male who comes in today for a preoperative History and Physical. The patient is scheduled for a removal of hardware left hip and hemiarthroplasty vs total hip arthroplasty to be performed by Dr. Javier Docker, MD at [facility] on 01/30/2013 . Please see the hospital record for complete dictated history and physical. Donald Carlson is now 7 months s/p left femoral neck fx sustained from a work-related injury, s/p ORIF with cannulated screws on 06/20/12. 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  Given the duration of the nonunion and his symptoms, we discussed conversion to total hip or hemiarthroplasty. He has reported no symptoms prior to that. He has a relatively well maintained joint space. With his age and comorbidities, he is a possible candidate for a hemi. 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 We discussed at least a porous-coated hemi. and intraoperative evaluation of the acetabulum and, if significant degenerative changes are noted, total hip. He does have other comorbidities, anemia, gout, noninsulin-dependent diabetes. There is significant coronary artery risk in his family. We discussed postoperative leg length discrepancy, DVT, PE, partial weight bearing for 6 weeks then full weight bearing and, if hemi. is performed, conversion to a total hip. Given his age and comorbidities, that may not be an option. We discussed the complications of a dislocation and post-op. course and 2-3 days in the hospital, preop. clearance which I would suggest again (received from Dr. Alwyn Carlson). He is on Vitamin D as well. Continue with calcium, weight bearing with the crutch, out of work.  Past Medical Hx Kidney  Stone Hypercholesterolemia Skin Cancer Gout Anemia High blood pressure Cancer. bladder Diabetes Mellitus, Type II Bronchitis Pneumonia Dentures  Allergies No Known Drug Allergies. 07/03/2012  Family History Drug / Alcohol Addiction. brother Heart Disease. mother Kidney disease. father Cancer. sister and brother Diabetes Mellitus. mother Congestive Heart Failure. First Degree Relatives. mother Hypertension. mother  Social History Tobacco use. Never smoker. never smoker Drug/Alcohol Rehab (Currently). no Alcohol use. former drinker Pain Contract. yes Number of flights of stairs before winded. greater than 5 Marital status. married Illicit drug use. no Children. 1 Living situation. live with spouse, one story home, 4 steps to enter Drug/Alcohol Rehab (Previously). no Post-Surgical Plans. home with HHPT. Has walker, crutches, cane Advance Directives. none  Medication History Norco (7.5-325MG  Tablet, Oral) Active. Carvedilol (25MG  Tablet, Oral) Active. MetFORMIN HCl (500MG  Tablet, Oral) Active. Pravastatin Sodium (40MG  Tablet, Oral) Active. Betoptic-S (0.25% Suspension, Ophthalmic) Active. Lisinopril (20MG  Tablet, Oral) Active. Flaxseed Oil ( Oral) Specific dose unknown - Active. Saw Palmetto ( Oral) Specific dose unknown - Active. Vitamin D ( Oral) Specific dose unknown - Active. AmLODIPine Besylate (5MG  Tablet, Oral) Active. Nascobal ( Nasal) Specific dose unknown - Active. Aspirin (325MG  Tablet, 1 (one) Oral BID) Active. Medications Reconciled.  Past Surgical History Shoulder Surgery Appendectomy Back Surgery  Review of Systems(Donald Carlson Donald Carlson; 01/22/2013 12:16 PM) General:Not Present- Chills, Fever, Night Sweats, Fatigue, Weight Gain, Weight Loss and Memory Loss. Skin:Not Present- Hives, Itching, Rash, Eczema and Lesions. HEENT:Present- Dentures. Not Present- Tinnitus, Headache, Double Vision, Visual Loss and Hearing  Loss. Respiratory:Not Present- Shortness of breath with exertion, Shortness of breath at rest, Allergies, Coughing up blood and Chronic Cough. Cardiovascular:Not Present- Chest  Pain, Racing/skipping heartbeats, Difficulty Breathing Lying Down, Murmur, Swelling and Palpitations. Gastrointestinal:Not Present- Bloody Stool, Heartburn, Abdominal Pain, Vomiting, Nausea, Constipation, Diarrhea, Difficulty Swallowing, Jaundice and Loss of appetitie. Male Genitourinary:Not Present- Urinary frequency, Blood in Urine, Weak urinary stream, Discharge, Flank Pain, Incontinence, Painful Urination, Urgency, Urinary Retention and Urinating at Night. Musculoskeletal:Present- Joint Pain and Morning Stiffness. Not Present- Muscle Weakness, Muscle Pain, Joint Swelling, Back Pain and Spasms. Neurological:Not Present- Tremor, Dizziness, Blackout spells, Paralysis, Difficulty with balance and Weakness. Psychiatric:Not Present- Insomnia.  Vitals 01/21/2013 2:10 PM BP: 166/82 (Sitting, Left Arm, Standard)  Physical Exam The physical exam findings are as follows:  General Mental Status - Alert, cooperative and good historian. General Appearance- pleasant. Not in acute distress. Orientation- Oriented X3. Build & Nutrition- Well nourished and Well developed.  Head and Neck Head- normocephalic, atraumatic . Neck Global Assessment- supple. no bruit auscultated on the right and no bruit auscultated on the left.  Eye Pupil- Bilateral- Regular and Round. Motion- Bilateral- EOMI.  Chest and Lung Exam Auscultation: Breath sounds:- clear at anterior chest wall and - clear at posterior chest wall. Adventitious sounds:- No Adventitious sounds.  Cardiovascular Auscultation:Rhythm- Regular rate and rhythm. Heart Sounds- S1 WNL and S2 WNL. Murmurs & Other Heart Sounds:Auscultation of the heart reveals - No Murmurs.  Abdomen Palpation/Percussion:Tenderness- Abdomen is non-tender to  palpation. Rigidity (guarding)- Abdomen is soft. Auscultation:Auscultation of the abdomen reveals - Bowel sounds normal.  Male Genitourinary Not done, not pertinent to present illness  Musculoskeletal Still having pain in his groin. On exam he walks with an antalgic gait with a crutch. With ROM of the hip, he has pain within the hip itself into the groin, limiting his ambulation. Neurologically he is intact. Lumbar spine is normal outside of the hip.  Lumbar spine exam reveals no evidence of soft tissue swelling, ecchymosis or deformity. The abdomen is soft and nontender. Nontender over the trochanters. No cellulitis or lymphadenopathy.  Good range of motion of the lumbar spine without associated pain. Straight leg raise is negative. Motor is 5/5 including EHL, tibialis anterior, plantar flexion, quadriceps and hamstrings. Patient is normoreflexic. There is no Babinski or clonus. Sensory exam is intact to light touch. The patient has good distal pulses. No DVT. No pain and normal range of motion without instability of the hips, knees and ankles.  He has good painless ROM of the cervical spine.  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 There has been no interval change since the previous. He has mild to moderate degenerative changes of the hip.  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 He is scheduled for removal of hardware and conversion to hemi vs THA by Dr. Shelle Iron on 01/30/13. Discussed the procedure itself as well as risks, complications, and alternatives including but not limited to DVT, PE, infx, bleeding, failure of procedure, need for secondary procedure, anesthesia risk, nerve injury, scarring, even death. Discussed expected post-operative outcomes, protocols, PT, posterior hip precautions, activity modifications and limitations, expected hospital stay, DVT and  dental ppx. Discussed post-operative dislocation risk and prevention. All his questions were answered and he desires to proceed. He has been cleared by his PCP Dr. Alwyn Carlson. He will have his pre-op labs at [facility] as scheduled 11/6. Will remain NPO after MN the night before surgery. Will hold ASA, supplements pre-op accordingly. Plan for home with HHPT post-op. He will schedule follow  up for 10-14 days post-op for staple removal and will call with any questions or concerns in the interim.  Plan left hip removal of hardware and conversion to hemiarthroplasty vs. Total hip replacement  Signed electronically by Dorothy Spark, PA-C for Dr. Shelle Iron

## 2013-01-30 NOTE — Preoperative (Signed)
Beta Blockers   Reason not to administer Beta Blockers:Not Applicable 

## 2013-01-30 NOTE — Anesthesia Postprocedure Evaluation (Signed)
  Anesthesia Post-op Note  Patient: Donald Carlson  Procedure(s) Performed: Procedure(s) (LRB): REMOVAL OF HARDWARE, LEFT HIP HEMIARTHROPLASTY (Left)  Patient Location: PACU  Anesthesia Type: General  Level of Consciousness: awake and alert   Airway and Oxygen Therapy: Patient Spontanous Breathing  Post-op Pain: mild  Post-op Assessment: Post-op Vital signs reviewed, Patient's Cardiovascular Status Stable, Respiratory Function Stable, Patent Airway and No signs of Nausea or vomiting  Last Vitals:  Filed Vitals:   01/30/13 1515  BP: 163/81  Pulse: 59  Temp:   Resp: 12    Post-op Vital Signs: stable   Complications: No apparent anesthesia complications

## 2013-01-30 NOTE — Progress Notes (Signed)
Portable AP Left Hip X-RAY done.

## 2013-01-30 NOTE — Interval H&P Note (Signed)
History and Physical Interval Note:  01/30/2013 9:03 AM  Donald Carlson  has presented today for surgery, with the diagnosis of NON-UNION LEFT HIP FRACTURE  The various methods of treatment have been discussed with the patient and family. After consideration of risks, benefits and other options for treatment, the patient has consented to  Procedure(s): REMOVAL OF HARDWARE LEFT HIP HEMIARTHROPLASTY VS TOTAL HIP ARTHROPLASTY (Left) as a surgical intervention .  The patient's history has been reviewed, patient examined, no change in status, stable for surgery.  I have reviewed the patient's chart and labs.  Questions were answered to the patient's satisfaction.     [name] C

## 2013-01-30 NOTE — Anesthesia Procedure Notes (Signed)
Procedure Name: Intubation Date/Time: 01/30/2013 12:05 PM Performed by: Leroy Libman L Patient Re-evaluated:Patient Re-evaluated prior to inductionOxygen Delivery Method: Circle system utilized Preoxygenation: Pre-oxygenation with 100% oxygen Intubation Type: IV induction Ventilation: Mask ventilation without difficulty and Oral airway inserted - appropriate to patient size Laryngoscope Size: Miller and 3 Grade View: Grade I Tube type: Oral Tube size: 8.0 mm Number of attempts: 1 Airway Equipment and Method: Stylet Placement Confirmation: ETT inserted through vocal cords under direct vision,  breath sounds checked- equal and bilateral and positive ETCO2 Secured at: 21 cm Tube secured with: Tape Dental Injury: Teeth and Oropharynx as per pre-operative assessment

## 2013-01-31 ENCOUNTER — Encounter (HOSPITAL_COMMUNITY): Payer: Self-pay | Admitting: Specialist

## 2013-01-31 LAB — GLUCOSE, CAPILLARY
Glucose-Capillary: 128 mg/dL — ABNORMAL HIGH (ref 70–99)
Glucose-Capillary: 139 mg/dL — ABNORMAL HIGH (ref 70–99)
Glucose-Capillary: 127 mg/dL — ABNORMAL HIGH (ref 70–99)
Glucose-Capillary: 111 mg/dL — ABNORMAL HIGH (ref 70–99)

## 2013-01-31 LAB — BASIC METABOLIC PANEL
BUN: 15 mg/dL (ref 6–23)
CO2: 29 mEq/L (ref 19–32)
Calcium: 8.9 mg/dL (ref 8.4–10.5)
Chloride: 100 mEq/L (ref 96–112)
Creatinine, Ser: 1.21 mg/dL (ref 0.50–1.35)
GFR calc Af Amer: 69 mL/min — ABNORMAL LOW (ref >90)
GFR calc non Af Amer: 60 mL/min — ABNORMAL LOW (ref >90)
Glucose, Bld: 136 mg/dL — ABNORMAL HIGH (ref 70–99)
Potassium: 4.1 mEq/L (ref 3.5–5.1)
Sodium: 135 mEq/L (ref 135–145)

## 2013-01-31 LAB — CBC
HCT: 31.1 % — ABNORMAL LOW (ref 39.0–52.0)
Hemoglobin: 10.7 g/dL — ABNORMAL LOW (ref 13.0–17.0)
MCH: 29.5 pg (ref 26.0–34.0)
MCHC: 34.4 g/dL (ref 30.0–36.0)
MCV: 85.7 fL (ref 78.0–100.0)
Platelets: 180 10*3/uL (ref 150–400)
RBC: 3.63 MIL/uL — ABNORMAL LOW (ref 4.22–5.81)
RDW: 12.9 % (ref 11.5–15.5)
WBC: 9.3 10*3/uL (ref 4.0–10.5)

## 2013-01-31 NOTE — Progress Notes (Signed)
Subjective: 1 Day Post-Op Procedure(s) (LRB): REMOVAL OF HARDWARE, LEFT HIP HEMIARTHROPLASTY (Left) Patient reports pain as moderate left lateral hip, incisional, mild groin pain. No other c/o this AM. Has not yet been OOB.  Objective: Vital signs in last 24 hours: Temp:  [97.6 F (36.4 C)-98.6 F (37 C)] 98.6 F (37 C) (11/14 0640) Pulse Rate:  [54-83] 83 (11/14 0804) Resp:  [5-18] 16 (11/14 0640) BP: (120-180)/(69-99) 120/77 mmHg (11/14 0804) SpO2:  [97 %-100 %] 99 % (11/14 0640) Weight:  [94.802 kg (209 lb)] 94.802 kg (209 lb) (11/13 1700)  Intake/Output from previous day: 11/13 0701 - 11/14 0700 In: 3845 [P.O.:840; I.V.:2850; IV Piggyback:155] Out: 1575 [Urine:1225; Blood:350] Intake/Output this shift:     Recent Labs  01/31/13 0358  HGB 10.7*    Recent Labs  01/31/13 0358  WBC 9.3  RBC 3.63*  HCT 31.1*  PLT 180    Recent Labs  01/31/13 0358  NA 135  K 4.1  CL 100  CO2 29  BUN 15  CREATININE 1.21  GLUCOSE 136*  CALCIUM 8.9   No results found for this basename: LABPT, INR,  in the last 72 hours  Neurologically intact ABD soft Neurovascular intact Sensation intact distally Intact pulses distally Dorsiflexion/Plantar flexion intact Incision: dressing C/D/I and no drainage No cellulitis present Compartment soft no calf pain or sign of DVT  Assessment/Plan: 1 Day Post-Op Procedure(s) (LRB): REMOVAL OF HARDWARE, LEFT HIP HEMIARTHROPLASTY (Left) Advance diet Up with therapy PWB LLE Posterior hip precautions Knee immobilizer while in bed Incentive spirometry Plan for home with HHPT when ready (likely POD #3) Will discuss with Dr. Elissa Lovett, Dayna Barker. 01/31/2013, 8:42 AM

## 2013-01-31 NOTE — Progress Notes (Signed)
Physical Therapy Treatment Patient Details Name: TAIMUR FIER MRN: [id number] DOB: [birth year] Today's Date: 01/31/2013 Time: 4098-1191 PT Time Calculation (min): 23 min  PT Assessment / Plan / Recommendation  History of Present Illness     PT Comments     Follow Up Recommendations  Home health PT     Does the patient have the potential to tolerate intense rehabilitation     Barriers to Discharge        Equipment Recommendations  None recommended by PT    Recommendations for Other Services OT consult  Frequency 7X/week   Progress towards PT Goals Progress towards PT goals: Progressing toward goals  Plan Current plan remains appropriate    Precautions / Restrictions Precautions Precautions: Posterior Hip;Fall Precaution Comments: Reviewed all precautions Restrictions Weight Bearing Restrictions: No   Pertinent Vitals/Pain 5/10; premed, Muscle Relax requested, ice pack provided    Mobility  Bed Mobility Bed Mobility: Not assessed Supine to Sit: 4: Min assist;3: Mod assist Details for Bed Mobility Assistance: cues for sequence and use of R LE to self assist Transfers Transfers: Sit to Stand;Stand to Sit Sit to Stand: 4: Min assist Stand to Sit: 4: Min assist Details for Transfer Assistance: cues for LE management and use of UEs to self assist Ambulation/Gait Ambulation/Gait Assistance: 4: Min assist Ambulation Distance (Feet): 86 Feet Assistive device: Rolling walker Ambulation/Gait Assistance Details: cues for PWB, position from RW, posture qand stride length Gait Pattern: Step-to pattern;Decreased step length - right;Decreased step length - left;Shuffle;Trunk flexed Gait velocity: decr    Exercises Total Joint Exercises Ankle Circles/Pumps: AROM;15 reps;Supine;Both Quad Sets: AROM;Both;10 reps;Supine Heel Slides: AAROM;Left;15 reps;Supine Hip ABduction/ADduction: AAROM;Left;15 reps;Supine   PT Diagnosis: Difficulty walking  PT Problem List: Decreased  strength;Decreased range of motion;Decreased activity tolerance;Decreased mobility;Decreased knowledge of use of DME;Pain;Decreased knowledge of precautions PT Treatment Interventions: DME instruction;Gait training;Functional mobility training;Stair training;Therapeutic activities;Therapeutic exercise;Patient/family education   PT Goals (current goals can now be found in the care plan section) Acute Rehab PT Goals Patient Stated Goal: Resume previous lifestyle with decreased pain PT Goal Formulation: With patient Time For Goal Achievement: 02/07/13 Potential to Achieve Goals: Good  Visit Information  Last PT Received On: 01/31/13 Assistance Needed: +1    Subjective Data  Patient Stated Goal: Resume previous lifestyle with decreased pain   Cognition  Cognition Arousal/Alertness: Awake/alert Behavior During Therapy: WFL for tasks assessed/performed Overall Cognitive Status: Within Functional Limits for tasks assessed    Balance     End of Session PT - End of Session Equipment Utilized During Treatment: Gait belt Activity Tolerance: Patient tolerated treatment well Patient left: in chair;with call bell/phone within reach;with family/visitor present Nurse Communication: Mobility status   GP     Jenney Brester 01/31/2013, 3:03 PM

## 2013-01-31 NOTE — Op Note (Signed)
Donald Carlson, NOBLET NO.:  [id number]  MEDICAL RECORD NO.:  [id number]  LOCATION:  1613                         FACILITY:  [facility]  PHYSICIAN:  Jene Every, M.D.    DATE OF BIRTH:  20-Jul-1945  DATE OF PROCEDURE:  01/30/2013 DATE OF DISCHARGE:                              OPERATIVE REPORT   PREOPERATIVE DIAGNOSIS:  Nonunion of left femoral neck fracture, retained hardware.  POSTOPERATIVE DIAGNOSIS:  Nonunion of left femoral neck fracture, retained hardware.  PROCEDURES PERFORMED: 1. Left hip hemiarthroplasty utilizing DePuy AML, 15 fully porous-     coated femoral component, 55 bipolar assembly with a +1 neck. 2. Removal of hardware and previous cannulated screws for I and D of     joint for intraoperative cultures.  ANESTHESIA:  General.  BRIEF HISTORY:  This is a 67 year old male status post femoral neck fracture, underwent percutaneous pin fixation of femoral neck fracture, unfortunately went on painful nonunion.  No evidence of fevers chills, or infection noted.  CT scan confirming the nonunions, minimal degenerative changes of the hip were noted.  He was indicated for conversion to a hemiarthroplasty and removal of hardware.  Risks and benefits discussed including bleeding, infection, DVT, PE, anesthetic complications, etc.  TECHNIQUE:  With the patient in supine position, after induction of adequate general anesthesia, 2 g Kefzol, was placed in the right lateral decubitus position.  All bony prominences were well padded, hip holder was utilized, while leg flexed, well-padded.  The left hip peritrochanteric region leg was prepped and draped in usual sterile fashion.  Standard posterolateral compression hip was then performed with incision over the trochanter, subcutaneous tissue was dissected  by electrocautery to achieve hemostasis.  The fascia lata was identified and divided in line with skin incision.  The inferior aspect of the incision, we  identified the 3 cannulated screws.  Fascia was incised and these were removed without difficulty and the canal irrigated and debrided.  The screws were intact.  Next, Charnley retractor was placed. Adductor tenotomy performed, piriformis identified, tagged, reflected posteriorly protecting the sciatic nerve throughout the case.  T-shaped capsulotomy was performed.  Hip was dislocated.  Clear synovial fluid was cultured.  The Gram stain culture looked without evidence of infection, was copiously irrigated.  Oscillating saw utilized to perform the osteotomy 1 fingerbreadth above the femoral neck.  Nonunion site was identified.  This was 1 fingerbreadth above the lesser trochanter.  I entered into the femoral canal with an initiator in the box chisel. A hand reamer guided into the femoral canal, towards the knee.  We then sequentially reamed to 15 mm diameter.  The last reamer was hand reamer with good cortical contact, lateralized throughout the approach. Copiously irrigated the canal.  We then broached in an appropriate version to a 15, seated it well.  Prior to that, we used a broach at a 12 and used a calcar planer.  This was removed.  We meticulously inspected the acetabulum, removed any debris and full cartilage noted, this was then also palpated and noted no evidence of osteophytes.  The knee coverage was noted.  No bear areas that were denuded of cartilage. Therefore, felt that proceeding with the  total hip at this point, proceeded with hemiarthroplasty and was appropriate and a considerable nonweightbearing through it and had his radiographs with periarticular diffuse osteoporosis given that and the excellent appearance of the cartilage bipolar was felt to be satisfactory.  We then trialed with a +155, impacted reduced, it was found to be stable throughout a full range after we had selected the 50 mm AML and impacted it without difficulty and the appropriate version to seat the  collar on the osteotomy was noted.  Again we trialed and reduced it and found to be stable.  Good leg lengths, full extension and flexion with internal rotation without dislocation.  Then re-dislocated, removed the trial, cleaned the trunnion selected the permanent prosthesis for insertion. This bipolar assembly was then reattached, impacted, after the trunnion was cleaned and hip was reduced.  Excellent leg lengths, good stability, and full extension and flexion with internal rotation.  Wound copiously irrigated.  No evidence of active bleeding.  We closed the retained capsule 1 Ethibond interrupted figure-of-eight sutures.  I repaired the adductor tenotomy with 1 Ethibond.  I palpated the sciatic nerve, was inspected and protected throughout the case intact.  Next, we repaired the fascia lata with 1 Vicryl in a figure-of-eight sutures, followed by a running Quill suture.  Wound closure was obtained and again we irrigated the wound, subcu with 2-0 skin with staples, and assess stability was stable throughout full range of motion testing excellent leg lengths.  Prior to insertion of the prosthesis, the culture which we had obtained when first dislocated hip.  The Gram stain was not available at the time of fascia, therefore felt proceeding with the procedure was appropriate. He was then placed after a sterile dressing applied, placed supine on the hospital bed.  Leg lengths were equivalent, good pulses.  Knee immobilizer placed, extubated without difficulty, and transported to recovery in satisfactory condition.  The patient tolerated the procedure well.  No complications.  Assistant, Lanna Poche, PA-C.     Jene Every, M.D.     [name]  D:  01/30/2013  T:  01/31/2013  Job:  [id number]

## 2013-01-31 NOTE — Care Management Note (Addendum)
    Page 1 of 2   02/03/2013     11:32:24 AM   CARE MANAGEMENT NOTE 02/03/2013  Patient:  Donald Carlson, Donald Carlson   Account Number:  [id number]  Date Initiated:  01/31/2013  Documentation initiated by:  Colleen Can  Subjective/Objective Assessment:   DX REMOVAL OF LEFT LEG HARDWARE; HEMIARTHROPLASTY LEFT    Worker's comp/DOI04/06/2012   Claim#[id number]/[employer] Ins-[place]x [id number], [place] [address]  Patty Cantrell-[phone]     Action/Plan:   CM spoke with patient and spouse. States this is aworker's comp claim.  Pt plans to return to his home in [place] where spouse will be caregiver.He already has RW, crutches, commode seat.   Anticipated DC Date:  02/01/2013   Anticipated DC Plan:  HOME W HOME HEALTH SERVICES  In-house referral  Clinical Social Worker      DC Associate Professor  CM consult      [facility] Choice  HOME HEALTH  DURABLE MEDICAL EQUIPMENT   Choice offered to / List presented to:  C-1 Patient   DME arranged  TUB BENCH      DME agency  OTHER - SEE NOTE     HH arranged  HH-2 PT      HH agency  OTHER - SEE NOTE   Status of service:  Completed, signed off Medicare Important Message given?   (If response is "NO", the following Medicare IM given date fields will be blank) Date Medicare IM given:   Date Additional Medicare IM given:    Discharge Disposition:  HOME W HOME HEALTH SERVICES  Per UR Regulation:  Reviewed for med. necessity/level of care/duration of stay  If discussed at Long Length of Stay Meetings, dates discussed:    Comments:  02/03/2014 Colleen Can BSN RN CCM [phone] Received call from [facility] -intake regarding setting up HHPT services for patient that was discharged yesterday 11/16; wanting list of [facility] agencies in this area to call to request HHpt services. List of 5 agencies given. Referred her to worker's comp case Chemical engineer if further barriers exist after calling theses agencies. States she does have their  contact phone numbers and will call as needed. States DME -tub bench will be delivered to patient's home   01/31/2013 Colleen Can BSN RN CCM [phone] tct adjuster-Patty Cantrell-[phone]; advised adjuster of pt's [facility] and DME needs. She requested that orders be faxed to [phone], confirmation received. She will call bck when services are arranged. 1545 follow up call to adjuster-Cantrell regarding status of HHpt and dme set up; spoke with customer service -Loraine Leriche who advised that he would fax request for HHpt and DME-tub bench to vendors- for HHpt and DME Contact phone numbers-[facility] for [phone] [employer] for [facility] services -[phone]. The above phone number were given to patient and spouse to use if needed. 1700 received call from [facility] regarding tub seat which they cam deliver to patient's home. She will contact patient.

## 2013-01-31 NOTE — Evaluation (Signed)
Physical Therapy Evaluation Patient Details Name: Donald Carlson MRN: [id number] DOB: 04-04-1945 Today's Date: 01/31/2013 Time: 4098-1191 PT Time Calculation (min): 34 min  PT Assessment / Plan / Recommendation History of Present Illness     Clinical Impression  Pt s/p L hip hemiarthroplasty 2* nonunion of previous fx presents with decreased L LE strength/ROM, post op pain, PWB and posterior hip precautions limiting functional mobility.  Pt should progress to d/c home with family assist and HHPT follow up.    PT Assessment  Patient needs continued PT services    Follow Up Recommendations  Home health PT    Does the patient have the potential to tolerate intense rehabilitation      Barriers to Discharge        Equipment Recommendations  None recommended by PT    Recommendations for Other Services OT consult   Frequency 7X/week    Precautions / Restrictions Precautions Precautions: Posterior Hip;Fall Precaution Comments: Sign hung on wall Restrictions Weight Bearing Restrictions: No   Pertinent Vitals/Pain 6/10; premed, ice packs provided      Mobility  Bed Mobility Bed Mobility: Supine to Sit Supine to Sit: 4: Min assist;3: Mod assist Details for Bed Mobility Assistance: cues for sequence and use of R LE to self assist Transfers Transfers: Sit to Stand;Stand to Sit Sit to Stand: 4: Min assist;3: Mod assist;With upper extremity assist;From bed Stand to Sit: 4: Min assist;With armrests;To chair/3-in-1 Details for Transfer Assistance: cues for LE management and use of UEs to self assist Ambulation/Gait Ambulation/Gait Assistance: 4: Min assist Ambulation Distance (Feet): 38 Feet Assistive device: Rolling walker Ambulation/Gait Assistance Details: cues for sequence, foot placement, PWB, posture, and stride length Gait Pattern: Step-to pattern;Decreased step length - right;Decreased step length - left;Shuffle;Trunk flexed    Exercises Total Joint  Exercises Ankle Circles/Pumps: AROM;15 reps;Supine;Both Quad Sets: AROM;Both;10 reps;Supine Heel Slides: AAROM;Left;15 reps;Supine Hip ABduction/ADduction: AAROM;Left;15 reps;Supine   PT Diagnosis: Difficulty walking  PT Problem List: Decreased strength;Decreased range of motion;Decreased activity tolerance;Decreased mobility;Decreased knowledge of use of DME;Pain;Decreased knowledge of precautions PT Treatment Interventions: DME instruction;Gait training;Functional mobility training;Stair training;Therapeutic activities;Therapeutic exercise;Patient/family education     PT Goals(Current goals can be found in the care plan section) Acute Rehab PT Goals Patient Stated Goal: Resume previous lifestyle with decreased pain PT Goal Formulation: With patient Time For Goal Achievement: 02/07/13 Potential to Achieve Goals: Good  Visit Information  Last PT Received On: 01/31/13 Assistance Needed: +1       Prior Functioning  Home Living Family/patient expects to be discharged to:: Private residence Living Arrangements: Spouse/significant other Available Help at Discharge: Family Type of Home: House Home Access: Stairs to enter Secretary/administrator of Steps: 3 Entrance Stairs-Rails: None Home Layout: One level Home Equipment: Walker - 2 wheels Prior Function Level of Independence: Independent;Independent with assistive device(s) Communication Communication: No difficulties    Cognition  Cognition Arousal/Alertness: Awake/alert Behavior During Therapy: WFL for tasks assessed/performed Overall Cognitive Status: Within Functional Limits for tasks assessed    Extremity/Trunk Assessment Upper Extremity Assessment Upper Extremity Assessment: Overall WFL for tasks assessed Lower Extremity Assessment Lower Extremity Assessment: LLE deficits/detail LLE Deficits / Details: Hip strtength 2+/5 with AAROm to 80 flex and 20 abd   Balance    End of Session PT - End of Session Equipment  Utilized During Treatment: Gait belt Activity Tolerance: Patient tolerated treatment well Patient left: in chair;with call bell/phone within reach;with family/visitor present Nurse Communication: Mobility status  GP     Zury Fazzino 01/31/2013,  1:00 PM

## 2013-01-31 NOTE — Progress Notes (Signed)
CSW consulted for SNF placement. Met with pt/spouse to assist with d/c planning. Pt plans to return home following hospital d/c. RNCM will assist with d/c planning.  Cori Razor LCSW [phone]

## 2013-02-01 LAB — CBC
HCT: 32.7 % — ABNORMAL LOW (ref 39.0–52.0)
Hemoglobin: 10.9 g/dL — ABNORMAL LOW (ref 13.0–17.0)
MCH: 28.6 pg (ref 26.0–34.0)
MCHC: 33.3 g/dL (ref 30.0–36.0)
MCV: 85.8 fL (ref 78.0–100.0)
Platelets: 184 10*3/uL (ref 150–400)
RBC: 3.81 MIL/uL — ABNORMAL LOW (ref 4.22–5.81)
RDW: 13 % (ref 11.5–15.5)
WBC: 12.1 10*3/uL — ABNORMAL HIGH (ref 4.0–10.5)

## 2013-02-01 LAB — GLUCOSE, CAPILLARY
Glucose-Capillary: 121 mg/dL — ABNORMAL HIGH (ref 70–99)
Glucose-Capillary: 134 mg/dL — ABNORMAL HIGH (ref 70–99)
Glucose-Capillary: 111 mg/dL — ABNORMAL HIGH (ref 70–99)
Glucose-Capillary: 118 mg/dL — ABNORMAL HIGH (ref 70–99)

## 2013-02-01 MED ORDER — POLYETHYLENE GLYCOL 3350 17 G PO PACK
17.0000 g | PACK | Freq: Every day | ORAL | Status: DC
  Administered 2013-02-01: 17 g via ORAL

## 2013-02-01 MED ORDER — SODIUM CHLORIDE 0.9 % IV SOLN
INTRAVENOUS | Status: DC
  Administered 2013-02-01 – 2013-02-02 (×2): via INTRAVENOUS

## 2013-02-01 NOTE — Progress Notes (Signed)
Occupational Therapy Evaluation Patient Details Name: FORBES LOLL MRN: [id number] DOB: 12-02-1945 Today's Date: 02/01/2013 Time: 2536-6440 OT Time Calculation (min): 28 min  OT Assessment / Plan / Recommendation History of present illness Patient is s/p L hip hemiarthroplasty   Clinical Impression   Patient doing well; states wife will assist with LB self-care. Issued reacher and long sponge. Patient declined sock aide/shoe horn.    OT Assessment  Patient needs continued OT Services    Follow Up Recommendations  No OT follow up;Supervision/Assistance - 24 hour    Barriers to Discharge      Equipment Recommendations  3 in 1 bedside comode;Tub/shower bench    Recommendations for Other Services    Frequency  Min 2X/week    Precautions / Restrictions Precautions Precautions: Posterior Hip;Fall Precaution Comments: Reviewed all precautions Restrictions Weight Bearing Restrictions: No   Pertinent Vitals/Pain Patient states he recently had pain medication    ADL  Eating/Feeding: Independent Grooming: Performed;Wash/dry face;Wash/dry hands;Set up Where Assessed - Grooming: Supported sitting Lower Body Bathing: Simulated;Minimal assistance;Other (comment) (with long sponge) Where Assessed - Lower Body Bathing: Supported sitting Lower Body Dressing: Simulated;Moderate assistance;Other (comment) (issued reacher and educated on LB dressing) Where Assessed - Lower Body Dressing: Supported sitting Toilet Transfer: Simulated;Min Pension scheme manager Method: Sit to stand;Stand pivot Acupuncturist: Materials engineer and Hygiene: Simulated;Min guard Where Assessed - Toileting Clothing Manipulation and Hygiene: Sit to stand from 3-in-1 or toilet Transfers/Ambulation Related to ADLs: bed mobility min A. Transfers min guard A with RW.  ADL Comments: Tub bench has been ordered for pt. Issued reacher and long sponge and educated on their  uses for ADLs. Patient verbalized understanding.    OT Diagnosis: Generalized weakness;Acute pain  OT Problem List: Decreased strength;Decreased activity tolerance;Decreased knowledge of use of DME or AE;Decreased knowledge of precautions;Pain OT Treatment Interventions: Self-care/ADL training;DME and/or AE instruction;Therapeutic activities;Patient/family education   OT Goals(Current goals can be found in the care plan section) Acute Rehab OT Goals Patient Stated Goal: Resume previous lifestyle with decreased pain OT Goal Formulation: With patient Time For Goal Achievement: 02/15/13 Potential to Achieve Goals: Good  Visit Information  Last OT Received On: 02/01/13 Assistance Needed: +1 History of Present Illness: Patient is s/p L hip hemiarthroplasty       Prior Functioning     Home Living Family/patient expects to be discharged to:: Private residence Living Arrangements: Spouse/significant other Available Help at Discharge: Family Type of Home: House Home Access: Stairs to enter Secretary/administrator of Steps: 3 Entrance Stairs-Rails: None Home Layout: One level Home Equipment: Walker - 2 wheels Additional Comments: tub bench has been ordered Prior Function Level of Independence: Independent;Independent with assistive device(s) Communication Communication: No difficulties         Vision/Perception     Cognition  Cognition Arousal/Alertness: Awake/alert Behavior During Therapy: WFL for tasks assessed/performed Overall Cognitive Status: Within Functional Limits for tasks assessed    Extremity/Trunk Assessment Upper Extremity Assessment Upper Extremity Assessment: Overall WFL for tasks assessed     Mobility       Exercise     Balance     End of Session OT - End of Session Equipment Utilized During Treatment: Rolling walker Activity Tolerance: Patient tolerated treatment well Patient left: in chair;with call bell/phone within reach  GO     Rosilyn Coachman,  Ahmyah Gidley A 02/01/2013, 11:45 AM

## 2013-02-01 NOTE — Progress Notes (Signed)
Subjective: 2 Days Post-Op Procedure(s) (LRB): REMOVAL OF HARDWARE, LEFT HIP HEMIARTHROPLASTY (Left) Patient reports pain as Pt doing well this AM, pain well tolerated, progressing well with physical therapy.  Pt denies N/V/F/C, chest pain, SOB, calf pain, and paresthesia bilaterally.    Objective: Vital signs in last 24 hours: Temp:  [98.7 F (37.1 C)-100.1 F (37.8 C)] 99.9 F (37.7 C) (11/15 0520) Pulse Rate:  [80-89] 89 (11/15 0800) Resp:  [15-18] 15 (11/15 0800) BP: (109-158)/(67-84) 120/83 mmHg (11/15 0800) SpO2:  [90 %-96 %] 92 % (11/15 0800)  Intake/Output from previous day: 11/14 0701 - 11/15 0700 In: 720 [P.O.:720] Out: 425 [Urine:425] Intake/Output this shift: Total I/O In: 60 [P.O.:60] Out: -    Recent Labs  01/31/13 0358 02/01/13 0435  HGB 10.7* 10.9*    Recent Labs  01/31/13 0358 02/01/13 0435  WBC 9.3 12.1*  RBC 3.63* 3.81*  HCT 31.1* 32.7*  PLT 180 184    Recent Labs  01/31/13 0358  NA 135  K 4.1  CL 100  CO2 29  BUN 15  CREATININE 1.21  GLUCOSE 136*  CALCIUM 8.9   No results found for this basename: LABPT, INR,  in the last 72 hours  Dressing c/d/i, normal amounts of post op edema present. Compartments soft, negative Homan's sign.  Lower extremities are NVI with good motor control of plantar/dorsiflexion of great toes and feet bilaterally.  No evidence of compartment syndrome noted  Assessment/Plan: 2 Days Post-Op Procedure(s) (LRB): REMOVAL OF HARDWARE, LEFT HIP HEMIARTHROPLASTY (Left) Will be discharged home tomorrow Change dressing tomorrow F/u with Dr. Shelle Iron in 2 weeks  Elysabeth Aust S 02/01/2013, 9:32 AM

## 2013-02-01 NOTE — Progress Notes (Signed)
Held PM Lisinopril d/t Low BP earlier today with lightheadedness. Will recheck BP routine.

## 2013-02-01 NOTE — Progress Notes (Signed)
Physical Therapy Treatment Patient Details Name: Donald Carlson MRN: [id number] DOB: 1945/06/23 Today's Date: 02/01/2013 Time: 9629-5284 PT Time Calculation (min): 27 min  PT Assessment / Plan / Recommendation  History of Present Illness Patient is s/p L hip hemiarthroplasty   PT Comments   Pt ltd this pm by fatigue.  Pt tolerated performing bed mobility, standing at side of bed to urinate and side stepping up bed.  Orthostatic BPs  Sup 150/79, sit 150/85, stand 148/72, after standing 3 min 154/68  Follow Up Recommendations  Home health PT     Does the patient have the potential to tolerate intense rehabilitation     Barriers to Discharge        Equipment Recommendations  None recommended by PT    Recommendations for Other Services OT consult  Frequency 7X/week   Progress towards PT Goals Progress towards PT goals: Progressing toward goals  Plan Current plan remains appropriate    Precautions / Restrictions Precautions Precautions: Posterior Hip;Fall Precaution Comments: Reviewed all precautions Restrictions Weight Bearing Restrictions: No   Pertinent Vitals/Pain 4/10; premed, ice pack provided.    Mobility  Bed Mobility Bed Mobility: Supine to Sit;Sit to Supine Supine to Sit: 3: Mod assist Sit to Supine: 4: Min assist;3: Mod assist Details for Bed Mobility Assistance: cues for sequence and use of R LE to self assist; Assist to manage L LE and bring trunk to upright position Transfers Transfers: Sit to Stand;Stand to Sit Sit to Stand: 4: Min assist;From elevated surface;From bed;With upper extremity assist Stand to Sit: 4: Min assist;To bed;To elevated surface;With upper extremity assist Details for Transfer Assistance: cues for LE management and use of UEs to self assist Ambulation/Gait Ambulation/Gait Assistance: 4: Min assist Ambulation Distance (Feet): 5 Feet Assistive device: Rolling walker Ambulation/Gait Assistance Details: Pt standing at side of bed to  urinate and side stepped up side of bed Gait Pattern: Decreased step length - right;Decreased step length - left;Shuffle;Trunk flexed Gait velocity: decr General Gait Details: ltd by pt c/o fatigue    Exercises     PT Diagnosis:    PT Problem List:   PT Treatment Interventions:     PT Goals (current goals can now be found in the care plan section) Acute Rehab PT Goals Patient Stated Goal: Resume previous lifestyle with decreased pain PT Goal Formulation: With patient Time For Goal Achievement: 02/07/13 Potential to Achieve Goals: Good  Visit Information  Last PT Received On: 02/01/13 Assistance Needed: +1 History of Present Illness: Patient is s/p L hip hemiarthroplasty    Subjective Data  Subjective: No c/o dizziness with mobility Patient Stated Goal: Resume previous lifestyle with decreased pain   Cognition  Cognition Arousal/Alertness: Awake/alert Behavior During Therapy: WFL for tasks assessed/performed Overall Cognitive Status: Within Functional Limits for tasks assessed    Balance     End of Session PT - End of Session Equipment Utilized During Treatment: Gait belt Activity Tolerance: Patient limited by fatigue Patient left: with call bell/phone within reach;in bed Nurse Communication: Mobility status   GP     Donald Carlson 02/01/2013, 4:52 PM

## 2013-02-01 NOTE — Progress Notes (Signed)
Physical Therapy Treatment Patient Details Name: Donald Carlson MRN: [id number] DOB: 1945/10/01 Today's Date: 02/01/2013 Time: 1022-1053 PT Time Calculation (min): 31 min  PT Assessment / Plan / Recommendation  History of Present Illness Patient is s/p L hip hemiarthroplasty   PT Comments   Pt with c/o gradual onset dizziness with ambulation.  Returned to sitting - BP 68/36.  RN aware.  Pt reclined x 3 min - BP 91/56 and pt assisted to bed.  Follow Up Recommendations  Home health PT     Does the patient have the potential to tolerate intense rehabilitation     Barriers to Discharge        Equipment Recommendations  None recommended by PT    Recommendations for Other Services OT consult  Frequency 7X/week   Progress towards PT Goals Progress towards PT goals: Progressing toward goals  Plan Current plan remains appropriate    Precautions / Restrictions Precautions Precautions: Posterior Hip;Fall Precaution Comments: Reviewed all precautions Restrictions Weight Bearing Restrictions: No   Pertinent Vitals/Pain 5/10; premed, ice packs provided    Mobility  Bed Mobility Bed Mobility: Sit to Supine Sit to Supine: 3: Mod assist Details for Bed Mobility Assistance: cues for sequence and use of R LE to self assist Transfers Transfers: Sit to Stand;Stand to Sit Sit to Stand: With upper extremity assist;Without upper extremity assist;With armrests;From chair/3-in-1;4: Min assist;3: Mod assist Stand to Sit: 3: Mod assist;To bed;To chair/3-in-1;With armrests Details for Transfer Assistance: cues for LE management and use of UEs to self assist Ambulation/Gait Ambulation/Gait Assistance: 4: Min assist Ambulation Distance (Feet): 38 Feet (and 5' chair to bed) Assistive device: Rolling walker Ambulation/Gait Assistance Details: cues for sequence, position from RW, stride length, posture and PWB Gait Pattern: Step-to pattern;Decreased step length - right;Decreased step length -  left;Shuffle;Trunk flexed Gait velocity: decr    Exercises     PT Diagnosis:    PT Problem List:   PT Treatment Interventions:     PT Goals (current goals can now be found in the care plan section) Acute Rehab PT Goals Patient Stated Goal: Resume previous lifestyle with decreased pain PT Goal Formulation: With patient Time For Goal Achievement: 02/07/13 Potential to Achieve Goals: Good  Visit Information  Last PT Received On: 02/01/13 Assistance Needed: +1 History of Present Illness: Patient is s/p L hip hemiarthroplasty    Subjective Data  Subjective: Pt reports gradual onset dizziness with ambulation Patient Stated Goal: Resume previous lifestyle with decreased pain   Cognition  Cognition Arousal/Alertness: Awake/alert Behavior During Therapy: WFL for tasks assessed/performed Overall Cognitive Status: Within Functional Limits for tasks assessed    Balance     End of Session PT - End of Session Equipment Utilized During Treatment: Gait belt Activity Tolerance: Patient tolerated treatment well Patient left: with call bell/phone within reach;in bed Nurse Communication: Mobility status   GP     Donald Carlson 02/01/2013, 12:34 PM

## 2013-02-02 LAB — BODY FLUID CULTURE: Culture: NO GROWTH

## 2013-02-02 LAB — CBC
HCT: 26.9 % — ABNORMAL LOW (ref 39.0–52.0)
Hemoglobin: 9.1 g/dL — ABNORMAL LOW (ref 13.0–17.0)
MCH: 29.2 pg (ref 26.0–34.0)
MCHC: 33.8 g/dL (ref 30.0–36.0)
MCV: 86.2 fL (ref 78.0–100.0)
Platelets: 150 10*3/uL (ref 150–400)
RBC: 3.12 MIL/uL — ABNORMAL LOW (ref 4.22–5.81)
RDW: 12.9 % (ref 11.5–15.5)
WBC: 10.6 10*3/uL — ABNORMAL HIGH (ref 4.0–10.5)

## 2013-02-02 LAB — GLUCOSE, CAPILLARY: Glucose-Capillary: 128 mg/dL — ABNORMAL HIGH (ref 70–99)

## 2013-02-02 NOTE — Discharge Summary (Signed)
Physician Discharge Summary   Patient ID: Donald Carlson MRN: [id number] DOB/AGE: May 07, 1945 67 y.o.  Admit date: 01/30/2013 Discharge date:   Primary Diagnosis: left hip fx nonunion  Admission Diagnoses:  Past Medical History  Diagnosis Date  . Hypertension   . Diabetes mellitus   . Anemia   . Arthritis   . HERNIORRHAPHY, HX OF 01/07/2007    Qualifier: Diagnosis of  By: Yetta Barre CMA, Chemira    . Small bowel obstruction s/p open lysis of adhesions [id number] 08/23/2011  . Hyperlipidemia   . Kidney stones   . Skin cancer (melanoma) 1996    Left/ Back  . Bladder cancer 2012    scrapped bladder and inserted liquid chemo  . Pneumonia   . Gout    Discharge Diagnoses:   Principal Problem:   Intertrochanteric fracture of left hip  Estimated body mass index is 29.99 kg/(m^2) as calculated from the following:   Height as of this encounter: 5\' 10"  (1.778 m).   Weight as of this encounter: 94.802 kg (209 lb).  Procedure(s) (LRB): REMOVAL OF HARDWARE, LEFT HIP HEMIARTHROPLASTY (Left)   Consults: None  HPI: see H&P Laboratory Data: Admission on 01/30/2013  Component Date Value Range Status  . Glucose-Capillary 01/30/2013 137* 70 - 99 mg/dL Final  . Comment 1 13/10/6576 Documented in Chart   Final  . Specimen Description 01/30/2013 HIP LEFT   Final  . Special Requests 01/30/2013 2G ANCEF   Final  . Gram Stain 01/30/2013    Final                   Value:FEW WBC PRESENT,BOTH PMN AND MONONUCLEAR                         NO ORGANISMS SEEN                         Performed by [facility] Gram Stain Report Called to,Read Back By and Verified With: Gram Stain Report Called to,Read Back By and Verified With:  Jasper Loser RN ON 01/30/13 @1353  BY J SCOTTON                         Performed at [facility]  . Culture 01/30/2013    Final                   Value:NO ANAEROBES ISOLATED; CULTURE IN PROGRESS FOR 5 DAYS                         Performed at [facility]    . Report Status 01/30/2013 PENDING   Incomplete  . Specimen Description 01/30/2013 HIP LEFT   Final  . Special Requests 01/30/2013 2G ANCEF   Final  . Gram Stain 01/30/2013    Final                   Value:FEW WBC PRESENT,BOTH PMN AND MONONUCLEAR                         NO ORGANISMS SEEN                         Performed by [facility] Gram Stain Report Called to,Read Back By and Verified With: Gram Stain Report Called to,Read Back By and  Verified With:  Jasper Loser RN ON 01/30/13 @1353  BY J SCOTTON                         Performed at [facility]  . Culture 01/30/2013    Final                   Value:NO GROWTH 2 DAYS                         Performed at [facility]  . Report Status 01/30/2013 PENDING   Incomplete  . Specimen Description 01/30/2013 HIP LEFT   Final  . Special Requests 01/30/2013 2G ANCEF   Final  . Gram Stain 01/30/2013    Final                   Value:FEW WBC PRESENT,BOTH PMN AND MONONUCLEAR                         NO ORGANISMS SEEN                         Gram Stain Report Called to,Read Back By and Verified With: Kizzie Furnish [phone] @ 1353 BY J SCOTTON  . Report Status 01/30/2013 01/30/2013 FINAL   Final  . Glucose-Capillary 01/30/2013 120* 70 - 99 mg/dL Final  . Comment 1 04/54/0981 Documented in Chart   Final  . Comment 2 01/30/2013 Notify RN   Final  . WBC 01/31/2013 9.3  4.0 - 10.5 K/uL Final  . RBC 01/31/2013 3.63* 4.22 - 5.81 MIL/uL Final  . Hemoglobin 01/31/2013 10.7* 13.0 - 17.0 g/dL Final  . HCT 19/14/7829 31.1* 39.0 - 52.0 % Final  . MCV 01/31/2013 85.7  78.0 - 100.0 fL Final  . MCH 01/31/2013 29.5  26.0 - 34.0 pg Final  . MCHC 01/31/2013 34.4  30.0 - 36.0 g/dL Final  . RDW 56/21/3086 12.9  11.5 - 15.5 % Final  . Platelets 01/31/2013 180  150 - 400 K/uL Final  . Sodium 01/31/2013 135  135 - 145 mEq/L Final  . Potassium 01/31/2013 4.1  3.5 - 5.1 mEq/L Final  . Chloride 01/31/2013 100  96 - 112 mEq/L Final  . CO2  01/31/2013 29  19 - 32 mEq/L Final  . Glucose, Bld 01/31/2013 136* 70 - 99 mg/dL Final  . BUN 57/84/6962 15  6 - 23 mg/dL Final  . Creatinine, Ser 01/31/2013 1.21  0.50 - 1.35 mg/dL Final  . Calcium 95/28/4132 8.9  8.4 - 10.5 mg/dL Final  . GFR calc non Af Amer 01/31/2013 60* >90 mL/min Final  . GFR calc Af Amer 01/31/2013 69* >90 mL/min Final   Comment: (NOTE)                          The eGFR has been calculated using the CKD EPI equation.                          This calculation has not been validated in all clinical situations.                          eGFR's persistently <90 mL/min signify possible Chronic Kidney  Disease.  . Glucose-Capillary 01/30/2013 114* 70 - 99 mg/dL Final  . Glucose-Capillary 01/30/2013 122* 70 - 99 mg/dL Final  . Glucose-Capillary 01/31/2013 128* 70 - 99 mg/dL Final  . Comment 1 16/12/9602 Documented in Chart   Final  . Comment 2 01/31/2013 Notify RN   Final  . Glucose-Capillary 01/31/2013 139* 70 - 99 mg/dL Final  . Comment 1 54/11/8117 Notify RN   Final  . Comment 2 01/31/2013 Documented in Chart   Final  . WBC 02/01/2013 12.1* 4.0 - 10.5 K/uL Final  . RBC 02/01/2013 3.81* 4.22 - 5.81 MIL/uL Final  . Hemoglobin 02/01/2013 10.9* 13.0 - 17.0 g/dL Final  . HCT 14/78/2956 32.7* 39.0 - 52.0 % Final  . MCV 02/01/2013 85.8  78.0 - 100.0 fL Final  . MCH 02/01/2013 28.6  26.0 - 34.0 pg Final  . MCHC 02/01/2013 33.3  30.0 - 36.0 g/dL Final  . RDW 21/30/8657 13.0  11.5 - 15.5 % Final  . Platelets 02/01/2013 184  150 - 400 K/uL Final  . Glucose-Capillary 01/31/2013 127* 70 - 99 mg/dL Final  . Comment 1 84/69/6295 Notify RN   Final  . Comment 2 01/31/2013 Documented in Chart   Final  . Glucose-Capillary 01/31/2013 111* 70 - 99 mg/dL Final  . Comment 1 28/41/3244 Notify RN   Final  . Glucose-Capillary 02/01/2013 121* 70 - 99 mg/dL Final  . Glucose-Capillary 02/01/2013 134* 70 - 99 mg/dL Final  . Glucose-Capillary 02/01/2013 111* 70 - 99  mg/dL Final  . WBC 03/22/7251 10.6* 4.0 - 10.5 K/uL Final  . RBC 02/02/2013 3.12* 4.22 - 5.81 MIL/uL Final  . Hemoglobin 02/02/2013 9.1* 13.0 - 17.0 g/dL Final  . HCT 66/44/0347 26.9* 39.0 - 52.0 % Final  . MCV 02/02/2013 86.2  78.0 - 100.0 fL Final  . MCH 02/02/2013 29.2  26.0 - 34.0 pg Final  . MCHC 02/02/2013 33.8  30.0 - 36.0 g/dL Final  . RDW 42/59/5638 12.9  11.5 - 15.5 % Final  . Platelets 02/02/2013 150  150 - 400 K/uL Final  . Glucose-Capillary 02/01/2013 118* 70 - 99 mg/dL Final  . Glucose-Capillary 02/02/2013 128* 70 - 99 mg/dL Final  Hospital Outpatient Visit on 01/23/2013  Component Date Value Range Status  . Sodium 01/23/2013 136  135 - 145 mEq/L Final  . Potassium 01/23/2013 4.4  3.5 - 5.1 mEq/L Final  . Chloride 01/23/2013 99  96 - 112 mEq/L Final  . CO2 01/23/2013 27  19 - 32 mEq/L Final  . Glucose, Bld 01/23/2013 236* 70 - 99 mg/dL Final  . BUN 75/64/3329 21  6 - 23 mg/dL Final  . Creatinine, Ser 01/23/2013 1.12  0.50 - 1.35 mg/dL Final  . Calcium 51/88/4166 10.1  8.4 - 10.5 mg/dL Final  . GFR calc non Af Amer 01/23/2013 66* >90 mL/min Final  . GFR calc Af Amer 01/23/2013 76* >90 mL/min Final   Comment: (NOTE)                          The eGFR has been calculated using the CKD EPI equation.                          This calculation has not been validated in all clinical situations.                          eGFR's persistently <  90 mL/min signify possible Chronic Kidney                          Disease.  . WBC 01/23/2013 9.4  4.0 - 10.5 K/uL Final  . RBC 01/23/2013 4.60  4.22 - 5.81 MIL/uL Final  . Hemoglobin 01/23/2013 13.2  13.0 - 17.0 g/dL Final  . HCT 16/12/9602 39.3  39.0 - 52.0 % Final  . MCV 01/23/2013 85.4  78.0 - 100.0 fL Final  . MCH 01/23/2013 28.7  26.0 - 34.0 pg Final  . MCHC 01/23/2013 33.6  30.0 - 36.0 g/dL Final  . RDW 54/11/8117 13.0  11.5 - 15.5 % Final  . Platelets 01/23/2013 179  150 - 400 K/uL Final  . Prothrombin Time 01/23/2013 12.6  11.6 -  15.2 seconds Final  . INR 01/23/2013 0.96  0.00 - 1.49 Final  . Color, Urine 01/23/2013 YELLOW  YELLOW Final  . APPearance 01/23/2013 CLOUDY* CLEAR Final  . Specific Gravity, Urine 01/23/2013 1.029  1.005 - 1.030 Final  . pH 01/23/2013 6.5  5.0 - 8.0 Final  . Glucose, UA 01/23/2013 NEGATIVE  NEGATIVE mg/dL Final  . Hgb urine dipstick 01/23/2013 NEGATIVE  NEGATIVE Final  . Bilirubin Urine 01/23/2013 SMALL* NEGATIVE Final  . Ketones, ur 01/23/2013 NEGATIVE  NEGATIVE mg/dL Final  . Protein, ur 14/78/2956 NEGATIVE  NEGATIVE mg/dL Final  . Urobilinogen, UA 01/23/2013 1.0  0.0 - 1.0 mg/dL Final  . Nitrite 21/30/8657 NEGATIVE  NEGATIVE Final  . Leukocytes, UA 01/23/2013 NEGATIVE  NEGATIVE Final   MICROSCOPIC NOT DONE ON URINES WITH NEGATIVE PROTEIN, BLOOD, LEUKOCYTES, NITRITE, OR GLUCOSE <1000 mg/dL.  Marland Kitchen aPTT 01/23/2013 32  24 - 37 seconds Final  . MRSA, PCR 01/23/2013 NEGATIVE  NEGATIVE Final  . Staphylococcus aureus 01/23/2013 NEGATIVE  NEGATIVE Final   Comment:                                 The Xpert SA Assay (FDA                          approved for NASAL specimens                          in patients over 29 years of age),                          is one component of                          a comprehensive surveillance                          program.  Test performance has                          been validated by [employer] for patients greater                          than or equal to 75 year old.  It is not intended                          to diagnose infection nor to                          guide or monitor treatment.  . ABO/RH(D) 01/23/2013 B POS   Final  . Antibody Screen 01/23/2013 NEG   Final  . Sample Expiration 01/23/2013 02/02/2013   Final  Office Visit on 01/07/2013  Component Date Value Range Status  . Hemoglobin A1C 01/07/2013 6.2  4.6 - 6.5 % Final   Glycemic Control Guidelines for People with Diabetes:Non  Diabetic:  <6%Goal of Therapy: <7%Additional Action Suggested:  >8%   . Microalb, Ur 01/07/2013 2.7* 0.0 - 1.9 mg/dL Final  . Creatinine,U 09/[birth year]/1914 454.6   Final  . Microalb Creat Ratio 01/07/2013 0.6  0.0 - 30.0 mg/g Final  . ALT 01/07/2013 34  0 - 53 U/L Final  . AST 01/07/2013 30  0 - 37 U/L Final  . Sodium 01/07/2013 142  135 - 145 mEq/L Final  . Potassium 01/07/2013 5.0  3.5 - 5.1 mEq/L Final  . Chloride 01/07/2013 106  96 - 112 mEq/L Final  . CO2 01/07/2013 28  19 - 32 mEq/L Final  . Glucose, Bld 01/07/2013 65* 70 - 99 mg/dL Final  . BUN 78/29/5621 16  6 - 23 mg/dL Final  . Creatinine, Ser 01/07/2013 1.0  0.4 - 1.5 mg/dL Final  . Calcium 30/86/5784 9.9  8.4 - 10.5 mg/dL Final  . GFR 69/62/9528 77.20  >60.00 mL/min Final     X-Rays:Dg Hip Complete Left  01/23/2013   CLINICAL DATA:  Preop removal of hardware from the left hip  EXAM: LEFT HIP - COMPLETE 2+ VIEW  COMPARISON:  None.  FINDINGS: There is no fracture or dislocation. There are 3 cannulated lag screws transfixing a healed left femoral neck fracture. There is no hardware failure or complication.  IMPRESSION: No acute osseous injury of the left hip.   Electronically Signed   By: Elige Ko   On: 01/23/2013 10:19   Dg Hip Portable 1 View Left  01/30/2013   CLINICAL DATA:  Postop for left total hip replacement.  EXAM: PORTABLE LEFT HIP - 1 VIEW  COMPARISON:  01/23/2013  FINDINGS: Interval left hip arthroplasty. Remote fixation screws within the remaining proximal femur. No periprosthetic fracture or acute hardware complication identified.  IMPRESSION: Expected appearance after left hip arthroplasty.   Electronically Signed   By: Jeronimo Greaves M.D.   On: 01/30/2013 16:10    EKG: Orders placed during the hospital encounter of 06/18/12  . EKG 12-LEAD  . EKG 12-LEAD  . EKG 12-LEAD  . EKG 12-LEAD  . EKG  . EKG     Hospital Course: Patient was admitted to [facility] and taken to the OR and underwent the above state  procedure without complications.  Patient tolerated the procedure well and was later transferred to the recovery room and then to the orthopaedic floor for postoperative care.  They were given PO and IV analgesics for pain control following their surgery.  They were given 24 hours of postoperative antibiotics of  Anti-infectives   Start     Dose/Rate Route Frequency Ordered Stop   01/30/13 1800  ceFAZolin (ANCEF) IVPB 2 g/50 mL premix     2 g 100 mL/hr over 30 Minutes Intravenous Every  6 hours 01/30/13 1636 01/30/13 2349   01/30/13 1715  clindamycin (CLEOCIN) IVPB 600 mg     600 mg 100 mL/hr over 30 Minutes Intravenous Every 8 hours 01/30/13 1636 01/31/13 0241   01/30/13 1240  polymyxin B 500,000 Units, bacitracin 50,000 Units in sodium chloride irrigation 0.9 % 500 mL irrigation  Status:  Discontinued       As needed 01/30/13 1240 01/30/13 1404   01/30/13 0930  ceFAZolin (ANCEF) IVPB 2 g/50 mL premix     2 g 100 mL/hr over 30 Minutes Intravenous On call to O.R. 01/30/13 0925 01/30/13 1208     and started on DVT prophylaxis in the form of Xarelto, TED hose and SCD's.   PT and OT were ordered for total hip protocol.  The patient was allowed to be WBAT with therapy. Discharge planning was consulted to help with postop disposition and equipment needs.  Patient had a fairly difficult night on the evening of surgery.  They started to get up OOB with therapy on day one. Continued to work with therapy into day two.  By day three, the patient had progressed with therapy and meeting their goals.  Incision was healing well.  Patient was seen in rounds and was ready to go home.   Discharge Medications: Prior to Admission medications   Medication Sig Start Date End Date Taking? Authorizing Provider  amLODipine (NORVASC) 5 MG tablet Take 1 tablet (5 mg total) by mouth daily. 01/07/13  Yes Pecola Lawless, MD  betaxolol (BETOPTIC-S) 0.25 % ophthalmic suspension Place 1 drop into both eyes 2 (two) times  daily.    Yes Historical Provider, MD  carvedilol (COREG) 25 MG tablet Take 25 mg by mouth 2 (two) times daily with a meal.   Yes Historical Provider, MD  Cholecalciferol (VITAMIN D) 1000 UNITS capsule Take 1,000 Units by mouth daily.    Yes Historical Provider, MD  lisinopril (PRINIVIL,ZESTRIL) 20 MG tablet Take 20 mg by mouth 2 (two) times daily.   Yes Historical Provider, MD  metFORMIN (GLUCOPHAGE) 500 MG tablet Take 500 mg by mouth daily with supper.    Yes Historical Provider, MD  pravastatin (PRAVACHOL) 40 MG tablet Take 40 mg by mouth every evening.  01/13/13  Yes Pecola Lawless, MD  allopurinol (ZYLOPRIM) 100 MG tablet Take 100 mg by mouth daily as needed (gout). 04/16/12   Pecola Lawless, MD  cortisone (CORTONE) 25 MG tablet Take 25 mg by mouth 2 (two) times daily.    Historical Provider, MD  Cyanocobalamin (NASCOBAL) 500 MCG/0.1ML SOLN Place 1 mcg into the nose once a week.    Historical Provider, MD  methocarbamol (ROBAXIN) 500 MG tablet Take 1 tablet (500 mg total) by mouth 3 (three) times daily between meals as needed for muscle spasms. 01/30/13   Javier Docker, MD  ONE [facility] ULTRA TEST test strip USE AS INSTRUCTED 12/02/12   Pecola Lawless, MD  oxyCODONE-acetaminophen (PERCOCET) 5-325 MG per tablet Take 1-2 tablets by mouth every 4 (four) hours as needed. 01/30/13   Javier Docker, MD  rivaroxaban (XARELTO) 10 MG TABS tablet Take 1 tablet (10 mg total) by mouth daily. 01/30/13   Javier Docker, MD    Diet: Diabetic diet Activity:PWB 50% No bending hip over 90 degrees- A "L" Angle Do not cross legs Do not let foot roll inward When turning these patients a pillow should be placed between the patient's legs to prevent crossing. Patients should have the affected  knee fully extended when trying to sit or stand from all surfaces to prevent excessive hip flexion. When ambulating and turning toward the affected side the affected leg should have the toes turned out prior to moving  the walker and the rest of patient's body as to prevent internal rotation/ turning in of the leg. Abduction pillows are the most effective way to prevent a patient from not crossing legs or turning toes in at rest. If an abduction pillow is not ordered placing a regular pillow length wise between the patient's legs is also an effective reminder. Knee immobilizer while in bed. It is imperative that these precautions be maintained so that the surgical hip does not dislocate. Follow-up:in 10-14 days Disposition - Home Discharged Condition: good      Medication List    STOP taking these medications       aspirin 81 MG tablet     Flax Seed Oil 1000 MG Caps     HYDROcodone-acetaminophen 7.5-325 MG per tablet  Commonly known as:  NORCO     saw palmetto 160 MG capsule      TAKE these medications       allopurinol 100 MG tablet  Commonly known as:  ZYLOPRIM  Take 100 mg by mouth daily as needed (gout).     amLODipine 5 MG tablet  Commonly known as:  NORVASC  Take 1 tablet (5 mg total) by mouth daily.     betaxolol 0.25 % ophthalmic suspension  Commonly known as:  BETOPTIC-S  Place 1 drop into both eyes 2 (two) times daily.     carvedilol 25 MG tablet  Commonly known as:  COREG  Take 25 mg by mouth 2 (two) times daily with a meal.     cortisone 25 MG tablet  Commonly known as:  CORTONE  Take 25 mg by mouth 2 (two) times daily.     lisinopril 20 MG tablet  Commonly known as:  PRINIVIL,ZESTRIL  Take 20 mg by mouth 2 (two) times daily.     metFORMIN 500 MG tablet  Commonly known as:  GLUCOPHAGE  Take 500 mg by mouth daily with supper.     methocarbamol 500 MG tablet  Commonly known as:  ROBAXIN  Take 1 tablet (500 mg total) by mouth 3 (three) times daily between meals as needed for muscle spasms.     NASCOBAL 500 MCG/0.1ML Soln  Generic drug:  Cyanocobalamin  Place 1 mcg into the nose once a week.     ONE TOUCH ULTRA TEST test strip  Generic drug:  glucose blood    USE AS INSTRUCTED     oxyCODONE-acetaminophen 5-325 MG per tablet  Commonly known as:  PERCOCET  Take 1-2 tablets by mouth every 4 (four) hours as needed.     pravastatin 40 MG tablet  Commonly known as:  PRAVACHOL  Take 40 mg by mouth every evening.     rivaroxaban 10 MG Tabs tablet  Commonly known as:  XARELTO  Take 1 tablet (10 mg total) by mouth daily.     Vitamin D 1000 UNITS capsule  Take 1,000 Units by mouth daily.           Follow-up Information   Follow up with BEANE,JEFFREY C, MD In 2 weeks.   Specialty:  Orthopedic Surgery   Contact information:   [address] [place] [address] [phone]       Signed: Dorothy Spark. 02/02/2013, 8:36 AM

## 2013-02-02 NOTE — Progress Notes (Addendum)
Discharged from floor via wheelchair. Spouse with pt. No changes in assessment. Cathleen Fears, Abby L Cosigned by [employer], RN

## 2013-02-02 NOTE — Progress Notes (Signed)
   Subjective: 3 Days Post-Op Procedure(s) (LRB): REMOVAL OF HARDWARE, LEFT HIP HEMIARTHROPLASTY (Left) Patient reports pain as mild.   Patient is well, and has had no acute complaints or problems, No issues overnight. No SOB or chest pain. Patient reports that therapy has gone well and he is ready to go home.    Objective: Vital signs in last 24 hours: Temp:  [98 F (36.7 C)-99.6 F (37.6 C)] 98 F (36.7 C) (11/16 0749) Pulse Rate:  [75-86] 86 (11/16 0749) Resp:  [14-16] 14 (11/16 0749) BP: (91-152)/(53-78) 152/78 mmHg (11/16 0749) SpO2:  [92 %-93 %] 93 % (11/16 0749)  Intake/Output from previous day:  Intake/Output Summary (Last 24 hours) at 02/02/13 0830 Last data filed at 02/02/13 0744  Gross per 24 hour  Intake 2111.5 ml  Output    900 ml  Net 1211.5 ml    Intake/Output this shift: Total I/O In: 120 [P.O.:120] Out: 400 [Urine:400]  Labs:  Recent Labs  01/31/13 0358 02/01/13 0435 02/02/13 0432  HGB 10.7* 10.9* 9.1*    Recent Labs  02/01/13 0435 02/02/13 0432  WBC 12.1* 10.6*  RBC 3.81* 3.12*  HCT 32.7* 26.9*  PLT 184 150    Recent Labs  01/31/13 0358  NA 135  K 4.1  CL 100  CO2 29  BUN 15  CREATININE 1.21  GLUCOSE 136*  CALCIUM 8.9    EXAM General - Patient is Alert and Oriented Extremity - Intact pulses distally Dorsiflexion/Plantar flexion intact No cellulitis present Compartment soft Dressing/Incision - clean, dry, no drainage Motor Function - intact, moving foot and toes well on exam.   Past Medical History  Diagnosis Date  . Hypertension   . Diabetes mellitus   . Anemia   . Arthritis   . HERNIORRHAPHY, HX OF 01/07/2007    Qualifier: Diagnosis of  By: Yetta Barre CMA, Chemira    . Small bowel obstruction s/p open lysis of adhesions [id number] 08/23/2011  . Hyperlipidemia   . Kidney stones   . Skin cancer (melanoma) 1996    Left/ Back  . Bladder cancer 2012    scrapped bladder and inserted liquid chemo  . Pneumonia   . Gout      Assessment/Plan: 3 Days Post-Op Procedure(s) (LRB): REMOVAL OF HARDWARE, LEFT HIP HEMIARTHROPLASTY (Left) Principal Problem:   Intertrochanteric fracture of left hip  Estimated body mass index is 29.99 kg/(m^2) as calculated from the following:   Height as of this encounter: 5\' 10"  (1.778 m).   Weight as of this encounter: 94.802 kg (209 lb). Advance diet Up with therapy D/C IV fluids Discharge home with home health  DVT Prophylaxis - Xarelto PWB 50%  He is doing well. Will work with therapy this morning. Will prepare for discharge home today.   Rebecca Cairns LAUREN 02/02/2013, 8:30 AM

## 2013-02-02 NOTE — Progress Notes (Signed)
Physical Therapy Treatment Patient Details Name: Donald Carlson MRN: [id number] DOB: [birth year] Today's Date: 02/02/2013 Time: 0940-1020 PT Time Calculation (min): 40 min  PT Assessment / Plan / Recommendation  History of Present Illness Patient is s/p L hip hemiarthroplasty   PT Comments   Pt tolerated session well, a lot of education performed with pt and wife for bed mobility, car transfer and steps for DC planning today.   Follow Up Recommendations  Home health PT     Does the patient have the potential to tolerate intense rehabilitation     Barriers to Discharge        Equipment Recommendations  None recommended by PT    Recommendations for Other Services OT consult  Frequency 7X/week   Progress towards PT Goals Progress towards PT goals: Progressing toward goals  Plan Current plan remains appropriate    Precautions / Restrictions Precautions Precautions: Posterior Hip;Fall Precaution Booklet Issued: Yes (comment) Precaution Comments: Reviewed all precautions Restrictions Weight Bearing Restrictions: No   Pertinent Vitals/Pain 2/10 L hip area    Mobility  Bed Mobility Bed Mobility: Supine to Sit;Sit to Supine Supine to Sit: 4: Min assist Sit to Supine: 4: Min assist;3: Mod assist Details for Bed Mobility Assistance: educated wife and pt how to get in and out of bed when return home. worked on session with HOB flat and no use of rails. 2 xs. Transfers Transfers: Sit to Stand;Stand to Sit Sit to Stand: 4: Min guard Stand to Sit: 4: Min guard Details for Transfer Assistance: reviewed hip precautions with tranfers and car transfers ,etc Ambulation/Gait Ambulation/Gait Assistance: 4: Min guard Ambulation Distance (Feet): 10 Feet (limited bc focus was on bed mobility and steps for DC ) Assistive device: Rolling walker Stairs: Yes Stairs Assistance: 4: Min assist Stairs Assistance Details (indicate cue type and reason): 3 Stair Management Technique:  Backwards;With walker Number of Stairs: 3    Exercises Total Joint Exercises Ankle Circles/Pumps: AROM;15 reps;Supine;Both Quad Sets: AROM;Both;10 reps;Supine Heel Slides: AAROM;Left;15 reps;Supine Hip ABduction/ADduction: AAROM;Left;15 reps;Supine (handout given for steps and exercise program)   PT Diagnosis:    PT Problem List:   PT Treatment Interventions:     PT Goals (current goals can now be found in the care plan section) Acute Rehab PT Goals Patient Stated Goal: Resume previous lifestyle with decreased pain PT Goal Formulation: With patient Time For Goal Achievement: 02/07/13 Potential to Achieve Goals: Good  Visit Information  Last PT Received On: 02/02/13 Assistance Needed: +1 History of Present Illness: Patient is s/p L hip hemiarthroplasty    Subjective Data  Subjective: I am feeling better today adn hoping to go home Patient Stated Goal: Resume previous lifestyle with decreased pain   Cognition  Cognition Arousal/Alertness: Awake/alert Behavior During Therapy: WFL for tasks assessed/performed Overall Cognitive Status: Within Functional Limits for tasks assessed    Balance     End of Session PT - End of Session Equipment Utilized During Treatment: Gait belt Activity Tolerance: Patient tolerated treatment well Patient left: with call bell/phone within reach;in bed Nurse Communication: Mobility status   GP     Marella Bile 02/02/2013, 1:23 PM Marella Bile, PT Pager: [phone] 02/02/2013

## 2013-02-04 LAB — ANAEROBIC CULTURE

## 2013-03-05 ENCOUNTER — Other Ambulatory Visit: Payer: Self-pay | Admitting: *Deleted

## 2013-03-05 MED ORDER — CYANOCOBALAMIN 500 MCG/0.1ML NA SOLN: 50.0000 ug | NASAL | Status: DC

## 2013-03-26 ENCOUNTER — Other Ambulatory Visit: Payer: Self-pay | Admitting: Internal Medicine

## 2013-03-26 NOTE — Telephone Encounter (Signed)
Lisinopril refilled per protocol. JG//CMA 

## 2013-03-28 ENCOUNTER — Encounter: Payer: Self-pay | Admitting: Internal Medicine

## 2013-04-06 ENCOUNTER — Encounter: Payer: Self-pay | Admitting: Internal Medicine

## 2013-04-09 ENCOUNTER — Telehealth: Payer: Self-pay | Admitting: Internal Medicine

## 2013-04-09 DIAGNOSIS — I10 Essential (primary) hypertension: Secondary | ICD-10-CM

## 2013-04-09 NOTE — Telephone Encounter (Signed)
[facility] called stating they need the following prescriptions for this patient: metformin, pravastatin, amlodipine, allopurinol, carvedilol, and lisinopril. Need patients full name, dob, shipping address, and [employer] ID # [id number] on cover page. Fax # [phone]

## 2013-04-11 MED ORDER — CARVEDILOL 25 MG PO TABS: 25.0000 mg | ORAL_TABLET | Freq: Two times a day (BID) | ORAL | Status: DC

## 2013-04-11 MED ORDER — AMLODIPINE BESYLATE 5 MG PO TABS: 5.0000 mg | ORAL_TABLET | Freq: Every day | ORAL | Status: DC

## 2013-04-11 MED ORDER — PRAVASTATIN SODIUM 40 MG PO TABS: 40.0000 mg | ORAL_TABLET | Freq: Every evening | ORAL | Status: DC

## 2013-04-11 MED ORDER — LISINOPRIL 20 MG PO TABS: ORAL_TABLET | ORAL | Status: DC

## 2013-04-11 MED ORDER — METFORMIN HCL 500 MG PO TABS: 500.0000 mg | ORAL_TABLET | Freq: Every day | ORAL | Status: DC

## 2013-04-11 MED ORDER — ALLOPURINOL 100 MG PO TABS: 100.0000 mg | ORAL_TABLET | Freq: Every day | ORAL | Status: DC | PRN

## 2013-04-11 NOTE — Telephone Encounter (Signed)
Rx's printed and faxed to [facility] with all the information.//AB/CMA

## 2013-05-10 ENCOUNTER — Other Ambulatory Visit: Payer: Self-pay | Admitting: Internal Medicine

## 2013-05-21 ENCOUNTER — Encounter: Payer: Self-pay | Admitting: Internal Medicine

## 2013-05-22 ENCOUNTER — Other Ambulatory Visit: Payer: Self-pay | Admitting: Internal Medicine

## 2013-05-22 DIAGNOSIS — C679 Malignant neoplasm of bladder, unspecified: Secondary | ICD-10-CM

## 2013-06-09 ENCOUNTER — Encounter: Payer: Self-pay | Admitting: Internal Medicine

## 2013-06-10 ENCOUNTER — Other Ambulatory Visit: Payer: Self-pay | Admitting: Internal Medicine

## 2013-06-10 DIAGNOSIS — H4089 Other specified glaucoma: Secondary | ICD-10-CM

## 2013-08-26 ENCOUNTER — Encounter: Payer: Self-pay | Admitting: Internal Medicine

## 2013-08-26 DIAGNOSIS — I10 Essential (primary) hypertension: Secondary | ICD-10-CM

## 2013-08-27 MED ORDER — CARVEDILOL 25 MG PO TABS: 25.0000 mg | ORAL_TABLET | Freq: Two times a day (BID) | ORAL | Status: DC

## 2013-08-27 MED ORDER — METFORMIN HCL 500 MG PO TABS: 500.0000 mg | ORAL_TABLET | Freq: Every day | ORAL | Status: DC

## 2013-08-27 MED ORDER — AMLODIPINE BESYLATE 5 MG PO TABS: 5.0000 mg | ORAL_TABLET | Freq: Every day | ORAL | Status: DC

## 2013-08-27 MED ORDER — PRAVASTATIN SODIUM 40 MG PO TABS: 40.0000 mg | ORAL_TABLET | Freq: Every evening | ORAL | Status: DC

## 2013-09-02 ENCOUNTER — Encounter: Payer: Self-pay | Admitting: Internal Medicine

## 2013-09-02 ENCOUNTER — Other Ambulatory Visit: Payer: Self-pay | Admitting: Internal Medicine

## 2013-09-02 DIAGNOSIS — H409 Unspecified glaucoma: Secondary | ICD-10-CM

## 2013-10-07 ENCOUNTER — Other Ambulatory Visit: Payer: Self-pay | Admitting: Internal Medicine

## 2013-10-07 ENCOUNTER — Encounter: Payer: Self-pay | Admitting: Internal Medicine

## 2013-10-07 DIAGNOSIS — C439 Malignant melanoma of skin, unspecified: Secondary | ICD-10-CM | POA: Insufficient documentation

## 2013-10-07 DIAGNOSIS — Z85820 Personal history of malignant melanoma of skin: Secondary | ICD-10-CM | POA: Insufficient documentation

## 2013-11-01 ENCOUNTER — Encounter: Payer: Self-pay | Admitting: Internal Medicine

## 2013-11-01 DIAGNOSIS — I10 Essential (primary) hypertension: Secondary | ICD-10-CM

## 2013-11-03 MED ORDER — PRAVASTATIN SODIUM 40 MG PO TABS: 40.0000 mg | ORAL_TABLET | Freq: Every evening | ORAL | Status: DC

## 2013-11-03 MED ORDER — AMLODIPINE BESYLATE 5 MG PO TABS: 5.0000 mg | ORAL_TABLET | Freq: Every day | ORAL | Status: DC

## 2013-11-03 MED ORDER — CARVEDILOL 25 MG PO TABS: 25.0000 mg | ORAL_TABLET | Freq: Two times a day (BID) | ORAL | Status: DC

## 2013-11-03 MED ORDER — METFORMIN HCL 500 MG PO TABS: 500.0000 mg | ORAL_TABLET | Freq: Every day | ORAL | Status: DC

## 2013-11-20 ENCOUNTER — Encounter: Payer: Self-pay | Admitting: Internal Medicine

## 2013-11-21 ENCOUNTER — Encounter: Payer: Self-pay | Admitting: Internal Medicine

## 2013-11-21 ENCOUNTER — Other Ambulatory Visit: Payer: Self-pay | Admitting: Internal Medicine

## 2013-11-21 DIAGNOSIS — C679 Malignant neoplasm of bladder, unspecified: Secondary | ICD-10-CM | POA: Insufficient documentation

## 2013-11-21 DIAGNOSIS — Z8551 Personal history of malignant neoplasm of bladder: Secondary | ICD-10-CM

## 2013-12-22 ENCOUNTER — Other Ambulatory Visit: Payer: Self-pay

## 2013-12-22 MED ORDER — LISINOPRIL 20 MG PO TABS: 20.0000 mg | ORAL_TABLET | Freq: Two times a day (BID) | ORAL | Status: DC

## 2014-01-08 ENCOUNTER — Encounter: Payer: Self-pay | Admitting: Internal Medicine

## 2014-01-08 DIAGNOSIS — I10 Essential (primary) hypertension: Secondary | ICD-10-CM

## 2014-01-09 MED ORDER — PRAVASTATIN SODIUM 40 MG PO TABS: 40.0000 mg | ORAL_TABLET | Freq: Every evening | ORAL | Status: DC

## 2014-01-09 MED ORDER — AMLODIPINE BESYLATE 5 MG PO TABS: 5.0000 mg | ORAL_TABLET | Freq: Every day | ORAL | Status: DC

## 2014-01-09 MED ORDER — METFORMIN HCL 500 MG PO TABS: 500.0000 mg | ORAL_TABLET | Freq: Every day | ORAL | Status: DC

## 2014-01-09 MED ORDER — CARVEDILOL 25 MG PO TABS: 25.0000 mg | ORAL_TABLET | Freq: Two times a day (BID) | ORAL | Status: DC

## 2014-02-01 ENCOUNTER — Encounter: Payer: Self-pay | Admitting: Internal Medicine

## 2014-02-02 ENCOUNTER — Other Ambulatory Visit: Payer: Self-pay | Admitting: Internal Medicine

## 2014-02-02 DIAGNOSIS — Z8739 Personal history of other diseases of the musculoskeletal system and connective tissue: Secondary | ICD-10-CM

## 2014-02-02 DIAGNOSIS — E785 Hyperlipidemia, unspecified: Secondary | ICD-10-CM

## 2014-02-02 DIAGNOSIS — E538 Deficiency of other specified B group vitamins: Secondary | ICD-10-CM | POA: Insufficient documentation

## 2014-02-02 DIAGNOSIS — R739 Hyperglycemia, unspecified: Secondary | ICD-10-CM

## 2014-02-03 ENCOUNTER — Other Ambulatory Visit (INDEPENDENT_AMBULATORY_CARE_PROVIDER_SITE_OTHER): Payer: Commercial Managed Care - HMO

## 2014-02-03 DIAGNOSIS — Z8739 Personal history of other diseases of the musculoskeletal system and connective tissue: Secondary | ICD-10-CM

## 2014-02-03 DIAGNOSIS — R7989 Other specified abnormal findings of blood chemistry: Secondary | ICD-10-CM

## 2014-02-03 DIAGNOSIS — E785 Hyperlipidemia, unspecified: Secondary | ICD-10-CM

## 2014-02-03 DIAGNOSIS — R739 Hyperglycemia, unspecified: Secondary | ICD-10-CM

## 2014-02-03 DIAGNOSIS — E538 Deficiency of other specified B group vitamins: Secondary | ICD-10-CM

## 2014-02-03 DIAGNOSIS — Z8639 Personal history of other endocrine, nutritional and metabolic disease: Secondary | ICD-10-CM

## 2014-02-03 LAB — CBC WITH DIFFERENTIAL/PLATELET
Basophils Absolute: 0 10*3/uL (ref 0.0–0.1)
Basophils Relative: 0.5 % (ref 0.0–3.0)
Eosinophils Absolute: 0.3 10*3/uL (ref 0.0–0.7)
Eosinophils Relative: 3.2 % (ref 0.0–5.0)
HCT: 41.4 % (ref 39.0–52.0)
Hemoglobin: 13.5 g/dL (ref 13.0–17.0)
Lymphocytes Relative: 21.3 % (ref 12.0–46.0)
Lymphs Abs: 1.7 10*3/uL (ref 0.7–4.0)
MCHC: 32.6 g/dL (ref 30.0–36.0)
MCV: 84.9 fl (ref 78.0–100.0)
Monocytes Absolute: 0.6 10*3/uL (ref 0.1–1.0)
Monocytes Relative: 6.9 % (ref 3.0–12.0)
Neutro Abs: 5.5 10*3/uL (ref 1.4–7.7)
Neutrophils Relative %: 68.1 % (ref 43.0–77.0)
Platelets: 201 10*3/uL (ref 150.0–400.0)
RBC: 4.88 Mil/uL (ref 4.22–5.81)
RDW: 13.7 % (ref 11.5–15.5)
WBC: 8.1 10*3/uL (ref 4.0–10.5)

## 2014-02-03 LAB — TSH: TSH: 0.99 u[IU]/mL (ref 0.35–4.50)

## 2014-02-03 LAB — VITAMIN B12: Vitamin B-12: 452 pg/mL (ref 211–911)

## 2014-02-03 LAB — HEMOGLOBIN A1C: Hgb A1c MFr Bld: 11.3 % — ABNORMAL HIGH (ref 4.6–6.5)

## 2014-02-04 LAB — HEPATIC FUNCTION PANEL
ALT: 28 U/L (ref 0–53)
AST: 19 U/L (ref 0–37)
Albumin: 4.2 g/dL (ref 3.5–5.2)
Alkaline Phosphatase: 71 U/L (ref 39–117)
Bilirubin, Direct: 0.1 mg/dL (ref 0.0–0.3)
Total Bilirubin: 0.4 mg/dL (ref 0.2–1.2)
Total Protein: 7.5 g/dL (ref 6.0–8.3)

## 2014-02-04 LAB — BASIC METABOLIC PANEL
BUN: 12 mg/dL (ref 6–23)
CO2: 20 mEq/L (ref 19–32)
Calcium: 10 mg/dL (ref 8.4–10.5)
Chloride: 108 mEq/L (ref 96–112)
Creatinine, Ser: 1.1 mg/dL (ref 0.4–1.5)
GFR: 72.05 mL/min (ref >60.00)
Glucose, Bld: 355 mg/dL — ABNORMAL HIGH (ref 70–99)
Potassium: 5.1 mEq/L (ref 3.5–5.1)
Sodium: 144 mEq/L (ref 135–145)

## 2014-02-04 LAB — LIPID PANEL
Cholesterol: 177 mg/dL (ref 0–200)
HDL: 34.2 mg/dL — ABNORMAL LOW (ref >39.00)
NonHDL: 142.8
Total CHOL/HDL Ratio: 5
Triglycerides: 207 mg/dL — ABNORMAL HIGH (ref 0.0–149.0)
VLDL: 41.4 mg/dL — ABNORMAL HIGH (ref 0.0–40.0)

## 2014-02-04 LAB — URIC ACID: Uric Acid, Serum: 4.6 mg/dL (ref 4.0–7.8)

## 2014-02-04 LAB — LDL CHOLESTEROL, DIRECT: Direct LDL: 116.7 mg/dL

## 2014-02-06 ENCOUNTER — Encounter: Payer: Self-pay | Admitting: Internal Medicine

## 2014-02-06 ENCOUNTER — Ambulatory Visit (INDEPENDENT_AMBULATORY_CARE_PROVIDER_SITE_OTHER): Payer: Commercial Managed Care - HMO | Admitting: Internal Medicine

## 2014-02-06 VITALS — BP 140/78 | HR 59 | Temp 98.3°F | Wt 202.0 lb

## 2014-02-06 DIAGNOSIS — E1141 Type 2 diabetes mellitus with diabetic mononeuropathy: Secondary | ICD-10-CM

## 2014-02-06 DIAGNOSIS — Z8639 Personal history of other endocrine, nutritional and metabolic disease: Secondary | ICD-10-CM

## 2014-02-06 DIAGNOSIS — E1165 Type 2 diabetes mellitus with hyperglycemia: Secondary | ICD-10-CM

## 2014-02-06 DIAGNOSIS — IMO0002 Reserved for concepts with insufficient information to code with codable children: Secondary | ICD-10-CM

## 2014-02-06 DIAGNOSIS — R7989 Other specified abnormal findings of blood chemistry: Secondary | ICD-10-CM

## 2014-02-06 DIAGNOSIS — Z8739 Personal history of other diseases of the musculoskeletal system and connective tissue: Secondary | ICD-10-CM

## 2014-02-06 DIAGNOSIS — E782 Mixed hyperlipidemia: Secondary | ICD-10-CM

## 2014-02-06 DIAGNOSIS — E1149 Type 2 diabetes mellitus with other diabetic neurological complication: Secondary | ICD-10-CM

## 2014-02-06 DIAGNOSIS — E538 Deficiency of other specified B group vitamins: Secondary | ICD-10-CM

## 2014-02-06 MED ORDER — GLIMEPIRIDE 2 MG PO TABS: 2.0000 mg | ORAL_TABLET | Freq: Every day | ORAL | Status: DC

## 2014-02-06 MED ORDER — METFORMIN HCL 500 MG PO TABS: ORAL_TABLET | ORAL | Status: DC

## 2014-02-06 NOTE — Progress Notes (Signed)
   Subjective:    Patient ID: Donald Carlson, male    DOB: 1945-10-24, 68 y.o.   MRN: [id number]  HPI He is here to assess active health issues & conditions. PMH, FH, & Social history verified & updated   His nasal B12 solution will rise to a cost of $1200 over 3 months. He states he will stop the medicine as he cannot afford this.  Although he  is retired,he is not physically active. He has not been following a heart healthy diet.  He did quit taking allopurinol; his most recent uric acid is 4.7.  He believes his blood pressure averages 144/77. He does not check on a regular basis. He is not restricting sodium  He has been on the metformin 500 mg twice a day but again following no diet. In fact his diet is described as poor. disposition A1c is 11.3% which would be associated with 126% increased risk & average sugar of 323. He questions a cortisone injection 2 months ago as been a factor in his high sugars and uncontrolled diabetes  He states his ophthalmologic exam is up-to-date ;he has no retinopathy  He states he's been taking his pravastatin 40 mg daily. His LDL was 117.    Review of Systems   Polyuria, polyphagia, polydipsia absent. There is no blurred vision, double vision, or loss of vision.  Also denied are numbness, tingling, or burning of the extremities. No nonhealing skin lesions present. Weight is stable.     Chest pain, palpitations, tachycardia, exertional dyspnea, paroxysmal nocturnal dyspnea, claudication or edema are absent.       Objective:   Physical Exam  Positive or pertinent findings include: He has complete dentures He has pain with range of motion of the left lower extremity. Diabetic foot exam is unremarkable.  General appearance :adequately nourished; in no distress. Eyes: No conjunctival inflammation or scleral icterus is present. Oral exam:  Lips and gums are healthy appearing.There is no oropharyngeal erythema or exudate noted.  Heart:   Normal rate and regular rhythm. S1 and S2 normal without gallop, murmur, click, rub or other extra sounds   Lungs:Chest clear to auscultation; no wheezes, rhonchi,rales ,or rubs present.No increased work of breathing.  Abdomen: bowel sounds normal, soft and non-tender without masses, organomegaly or hernias noted.  No guarding or rebound.  Vascular : all pulses equal ; no bruits present. Skin:Warm & dry.  Intact without suspicious lesions or rashes ; no jaundice or tenting Lymphatic: No lymphadenopathy is noted about the head, neck, axilla          Assessment & Plan:  See Current Assessment & Plan in Problem List under specific Diagnosis

## 2014-02-06 NOTE — Assessment & Plan Note (Addendum)
D/C [place] B12 level in 4 months

## 2014-02-06 NOTE — Progress Notes (Signed)
Pre visit review using our clinic review tool, if applicable. No additional management support is needed unless otherwise documented below in the visit note. 

## 2014-02-06 NOTE — Patient Instructions (Signed)
  The following nutritional changes may help prevent Diabetes progression & complications.  White carbohydrates (potatoes, rice, bread, and pasta) cause a high spike of the sugar level which stays elevated for a significant period of time (called sugar"load").  For example a  baked potato has a cup of sugar and a  french fry  2 teaspoons of sugar.  More complex carbs such as yams, wild  rice, whole grained bread &  wheat pasta have been much lower spike and persistent load of sugar than the white carbs. The pancreas excretes excess insulin in response to the high spike & load of sugar . Over time the pancreas can actually run out of insulin necessitating insulin shots.  The most common cause of elevated triglycerides (TG) is the ingestion of sugar from high fructose corn syrup sources added to processed foods & drinks.  Eat a low-fat diet with lots of fruits and vegetables, up to 7-9 servings per day. Consume less than 40 Grams (preferably ZERO) of sugar per day from foods & drinks with High Fructose Corn Syrup (HFCS) sugar as #1,2,3 or # 4 on label.[employer], [employer] do not carry products with HFCS. Portions should be the size of a deck of cards or your palm.

## 2014-02-08 NOTE — Assessment & Plan Note (Signed)
Restart Amaryl to detox TLC discused

## 2014-02-08 NOTE — Assessment & Plan Note (Signed)
TLC  Lipids 4p mos

## 2014-02-09 ENCOUNTER — Other Ambulatory Visit: Payer: Self-pay | Admitting: Internal Medicine

## 2014-03-30 ENCOUNTER — Other Ambulatory Visit: Payer: Self-pay

## 2014-03-30 MED ORDER — LISINOPRIL 20 MG PO TABS: 20.0000 mg | ORAL_TABLET | Freq: Two times a day (BID) | ORAL | Status: DC

## 2014-04-01 ENCOUNTER — Other Ambulatory Visit (INDEPENDENT_AMBULATORY_CARE_PROVIDER_SITE_OTHER): Payer: Medicare Other

## 2014-04-01 ENCOUNTER — Ambulatory Visit (INDEPENDENT_AMBULATORY_CARE_PROVIDER_SITE_OTHER): Payer: Medicare Other | Admitting: Internal Medicine

## 2014-04-01 ENCOUNTER — Encounter: Payer: Self-pay | Admitting: Internal Medicine

## 2014-04-01 ENCOUNTER — Other Ambulatory Visit: Payer: Self-pay | Admitting: Internal Medicine

## 2014-04-01 VITALS — BP 144/90 | HR 60 | Temp 98.0°F | Ht 70.25 in | Wt 206.4 lb

## 2014-04-01 DIAGNOSIS — IMO0002 Reserved for concepts with insufficient information to code with codable children: Secondary | ICD-10-CM

## 2014-04-01 DIAGNOSIS — E1165 Type 2 diabetes mellitus with hyperglycemia: Secondary | ICD-10-CM

## 2014-04-01 DIAGNOSIS — I10 Essential (primary) hypertension: Secondary | ICD-10-CM

## 2014-04-01 DIAGNOSIS — G8929 Other chronic pain: Secondary | ICD-10-CM

## 2014-04-01 DIAGNOSIS — M25552 Pain in left hip: Secondary | ICD-10-CM

## 2014-04-01 DIAGNOSIS — E1140 Type 2 diabetes mellitus with diabetic neuropathy, unspecified: Secondary | ICD-10-CM

## 2014-04-01 DIAGNOSIS — J209 Acute bronchitis, unspecified: Secondary | ICD-10-CM

## 2014-04-01 LAB — BASIC METABOLIC PANEL
BUN: 14 mg/dL (ref 6–23)
CO2: 31 mEq/L (ref 19–32)
Calcium: 10 mg/dL (ref 8.4–10.5)
Chloride: 107 mEq/L (ref 96–112)
Creatinine, Ser: 1.01 mg/dL (ref 0.40–1.50)
GFR: 77.8 mL/min (ref >60.00)
Glucose, Bld: 101 mg/dL — ABNORMAL HIGH (ref 70–99)
Potassium: 4.9 mEq/L (ref 3.5–5.1)
Sodium: 141 mEq/L (ref 135–145)

## 2014-04-01 LAB — MICROALBUMIN / CREATININE URINE RATIO
Creatinine,U: 168 mg/dL
Microalb Creat Ratio: 1.5 mg/g (ref 0.0–30.0)
Microalb, Ur: 2.6 mg/dL — ABNORMAL HIGH (ref 0.0–1.9)

## 2014-04-01 LAB — HEMOGLOBIN A1C: Hgb A1c MFr Bld: 8.8 % — ABNORMAL HIGH (ref 4.6–6.5)

## 2014-04-01 MED ORDER — PIOGLITAZONE HCL 15 MG PO TABS
15.0000 mg | ORAL_TABLET | Freq: Every day | ORAL | Status: DC
Start: 2014-04-01 — End: 2014-07-21

## 2014-04-01 MED ORDER — AZITHROMYCIN 250 MG PO TABS
ORAL_TABLET | ORAL | Status: DC
Start: 2014-04-01 — End: 2014-04-24

## 2014-04-01 NOTE — Progress Notes (Signed)
Pre visit review using our clinic review tool, if applicable. No additional management support is needed unless otherwise documented below in the visit note. 

## 2014-04-01 NOTE — Assessment & Plan Note (Signed)
Blood pressure goals reviewed. BMET 

## 2014-04-01 NOTE — Patient Instructions (Addendum)
Your next office appointment will be determined based upon review of your pending labs. Those instructions will be transmitted to you through My Chart    Minimal Blood Pressure Goal= AVERAGE < 140/90;  Ideal is an AVERAGE < 135/85. This AVERAGE should be calculated from @ least 5-7 BP readings taken @ different times of day on different days of week. You should not respond to isolated BP readings , but rather the AVERAGE for that week .Please bring your  blood pressure cuff to office visits to verify that it is reliable.It  can also be checked against the blood pressure device at the pharmacy. Finger or wrist cuffs are not dependable; an arm cuff is.  Fill the  prescription for Zithromax it there is not dramatic improvement in the next 48-72 hours.

## 2014-04-01 NOTE — Assessment & Plan Note (Signed)
A1c , urine microalbumin, BMET 

## 2014-04-01 NOTE — Progress Notes (Signed)
   Subjective:    Patient ID: Donald Carlson, male    DOB: 1945/05/24, 69 y.o.   MRN: [id number]  HPI He is awaiting operative scheduling for resection of a painful bursa sac.  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 He fell onto concrete fracturing his left hip. This was pinned but this was, he by incomplete healing. This required partial hip replacement. Subsequently he developed the painful bursa. Steroid injections were of no benefit. Unfortunately that also drove the sugar up to 588.  He has been compliant with his medications without adverse adverse effect. He is obviously unable to exercise due to the hip issues He has been restricting sugar and carbs. He also avoids excess salt.  Blood pressures ranges from 135-170/40 5-90.  His blood sugars range 100-130. Ophthalmologic exams up-to-date. The last A1c on record was 11.3% on 02/03/14.  He does have some nocturnal pedal edema.  He has some burning the left foot. Otherwise he has no active symptoms.  He developed an upper respiratory tract infection last week. This is associated with a nonproductive cough. He denies upper respiratory tract symptoms otherwise.   Review of Systems   He denies hypoglycemia, polyuria, polyphagia, polydipsia. Significant headaches, epistaxis, chest pain, palpitations, exertional dyspnea, claudication, or paroxysmal nocturnal dyspnea absent.  No GI symptoms , memory loss or myalgias   Frontal headache, facial pain , nasal purulence, dental pain, sore throat , otic pain or otic discharge denied. No fever , chills or sweats.   Objective:   Physical Exam  Pertinent or positive findings: He has a nonproductive, rattly cough. Complete dentures are present The nasal septum is deviated to the right Abdomen protuberant He has medial deviation of the fifth DIP joints bilaterally.  General appearance :adequately nourished; in no distress. Eyes:  No conjunctival inflammation or scleral icterus is present. Oral exam:  Lips and gums are healthy appearing.There is no oropharyngeal erythema or exudate noted.  Heart:  Normal rate and regular rhythm. S1 and S2 normal without gallop, murmur, click, rub or other extra sounds   Lungs:Chest clear to auscultation; no wheezes, rhonchi,rales ,or rubs present.No increased work of breathing.  Abdomen: bowel sounds normal, soft and non-tender without masses, organomegaly or hernias noted.  No guarding or rebound. No flank tenderness to percussion. Vascular : all pulses equal ; no bruits present. Skin:Warm & dry.  Intact without suspicious lesions or rashes ; no jaundice or tenting Lymphatic: No lymphadenopathy is noted about the head, neck, axilla         Assessment & Plan:  #1 painful bursa sac  #2 see current diagnoses and recommendations

## 2014-04-06 ENCOUNTER — Telehealth: Payer: Self-pay

## 2014-04-06 NOTE — Telephone Encounter (Signed)
04/01/14 office note has been faxed to [facility] along with clearance sheet

## 2014-04-08 ENCOUNTER — Encounter: Payer: Self-pay | Admitting: Internal Medicine

## 2014-04-10 NOTE — Progress Notes (Signed)
Please put orders in Epic surgery 04-23-14 pre op 04-20-14 Thanks

## 2014-04-13 ENCOUNTER — Ambulatory Visit: Payer: Self-pay | Admitting: Orthopedic Surgery

## 2014-04-13 NOTE — H&P (Signed)
Donald Carlson is an 69 y.o. male.   Chief Complaint: Left hip pain HPI: The patient is a 69 year old male who presents today for follow up of their hip. The patient is being followed for their left hip. They are 14 month(s) out from a hemiarthroplasty and 21 months 1 1/2 weeks out from injury. . Symptoms reported today include: pain (when active). and report their pain level to be mild (to severe). Current treatment includes: home exercise program (prn) and activity modification. The following medication has been used for pain control: none. Note for "Follow-up Hip": The patient was placed on a 15lb. lifting restriction, no bending, stooping, or squatting, no prolonged sitting or standing, and no standing more than 1/3 of the day, but he is currently not working.  Donald Carlson follows up. He had his hydrocele looked at. The urologist does not feel it is part of his pain pattern. He reports mainly pain over the trochanter, intermittently secondary pain into the groin.  Past Medical History  Diagnosis Date  . Hypertension   . Diabetes mellitus   . Anemia   . Arthritis   . HERNIORRHAPHY, HX OF 01/07/2007    Qualifier: Diagnosis of  By: Donald Carlson CMA, Chemira    . Small bowel obstruction s/p open lysis of adhesions [id number] 08/23/2011  . Hyperlipidemia   . Kidney stones   . Skin cancer (melanoma) 1996    Left/ Back  . Bladder cancer 2012    scrapped bladder and inserted liquid chemo  . Pneumonia   . Gout     Past Surgical History  Procedure Laterality Date  . 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 Hernia repair    . Knee arthroscopy      Left knee  . Colonoscopy  07/2002    neg  . Lumbar laminectomy  2004  . Shoulder surgery  05/2008  . Laparotomy  08/23/2011    Procedure: EXPLORATORY LAPAROTOMY;  Surgeon: Donald Jolly, MD;  Location: [facility];  Service: General;  Laterality: N/A;  Lysis of Adhesions/small bowel obstruction  . Abdominal adhesion surgery  08/23/2011     Procedure: EXPLORATORY LAPAROTOMY;  Surgeon: Donald Jolly, MD;  Location: [facility];  Service: General;  Laterality: N/A;  Lysis of Adhesions/small bowel obstruction  . Hip pinning,cannulated Left 06/19/2012    Procedure: CANNULATED HIP PINNING;  Surgeon: Donald Hai, MD;  Location: [facility];  Service: Orthopedics;  Laterality: Left;  . Fracture surgery    . Total hip arthroplasty Left 01/30/2013    Procedure: REMOVAL OF HARDWARE, LEFT HIP HEMIARTHROPLASTY;  Surgeon: Donald Hai, MD;  Location: [facility];  Service: Orthopedics;  Laterality: Left;    Family History  Problem Relation Age of Onset  . Diabetes Mother   . Heart attack Mother 48  . Heart disease Mother   . Heart attack Brother 85  . Lung cancer Brother   . Emphysema Brother   . Cancer Brother     lung  . Cancer Brother     Lymph node/neck  . Breast cancer Sister   . Cancer Sister     breast   Social History:  reports that he has never smoked. He has never used smokeless tobacco. He reports that he does not drink alcohol or use illicit drugs.  Allergies: No Known Allergies   (Not in a hospital admission)  No results found for this or any previous visit (from the past 48 hour(s)). No results  found.  Review of Systems  Constitutional: Negative.   HENT: Negative.   Eyes: Negative.   Respiratory: Negative.   Cardiovascular: Negative.   Gastrointestinal: Negative.   Genitourinary: Negative.   Musculoskeletal: Positive for joint pain.  Skin: Negative.   Neurological: Negative.   Psychiatric/Behavioral: Negative.     There were no vitals taken for this visit. Physical Exam  Constitutional: He is oriented to person, place, and time. He appears well-developed and well-nourished.  HENT:  Head: Normocephalic and atraumatic.  Eyes: Conjunctivae and EOM are normal. Pupils are equal, round, and reactive to light.  Neck: Normal range of motion. Neck supple.  Cardiovascular: Normal rate and regular rhythm.    Respiratory: Effort normal and breath sounds normal.  GI: Soft. Bowel sounds are normal.  Musculoskeletal:  On exam he is exquisitely tender over the trochanter again. He walks with a slight antalgic gait. In a seated position he can flex, extend, and abduct the hip, and actually load the hip without pain other than exquisite tenderness. When he auto flexes the hip in the seated position that is where he gets pain.  Neurological: He is alert and oriented to person, place, and time. He has normal reflexes.  Skin: Skin is warm and dry.  Psychiatric: He has a normal mood and affect.    MRI indicated atrophy of the external rotators, scar tissue, no definitive pathology.  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 Intermittent groin pain with differential diagnosis that includes scar tissue, tendon injury versus early osteoarthrosis of the acetabulum.  Options include: 1. Do nothing. 2. Intermittent injection 3. Anti inflammatories and stretches.  He reports he has relegated more sedentary position because of his hip, therefore, discussed open bursectomy including the risks of bleeding and infection. I would do it as an outpatient, crutches for a day, and then continued ambulation. Increasing groin pain that is weightbearing, that may be an indication to revise to a total hip, but at this point I do not see that. I do feel it is related to his initial injury.  Plan Excision left trochanteric hip bursa  Donald Kneeland M. PA-C for Dr. Tonita Cong 04/13/2014, 8:19 AM

## 2014-04-17 NOTE — Patient Instructions (Addendum)
Donald Carlson  04/17/2014   Your procedure is scheduled on: Thursday 04/23/2014   Report to [facility] Main  Entrance and follow signs to               [place] at 0730 AM.  Call this number if you have problems the morning of surgery [phone]   Remember:  Do not eat food or drink liquids :After Midnight.     Take these medicines the morning of surgery with A SIP OF WATER: Coreg, Amlodipine, Betoptic opthalmic solution                               You may not have any metal on your body including hair pins and              piercings  Do not wear jewelry, make-up, lotions, powders or perfumes.             Do not wear nail polish.  Do not shave  48 hours prior to surgery.              Men may shave face and neck.   Do not bring valuables to the hospital. [place].  Contacts, dentures or bridgework may not be worn into surgery.  Leave suitcase in the car. After surgery it may be brought to your room.     Patients discharged the day of surgery will not be allowed to drive home.  Name and phone number of your driver:  Special Instructions: N/A              Please read over the following fact sheets you were given: _____________________________________________________________________             [facility] - Preparing for Surgery Before surgery, you can play an important role.  Because skin is not sterile, your skin needs to be as free of germs as possible.  You can reduce the number of germs on your skin by washing with CHG (chlorahexidine gluconate) soap before surgery.  CHG is an antiseptic cleaner which kills germs and bonds with the skin to continue killing germs even after washing. Please DO NOT use if you have an allergy to CHG or antibacterial soaps.  If your skin becomes reddened/irritated stop using the CHG and inform your nurse when you arrive at Short Stay. Do not shave (including  legs and underarms) for at least 48 hours prior to the first CHG shower.  You may shave your face/neck. Please follow these instructions carefully:  1.  Shower with CHG Soap the night before surgery and the  morning of Surgery.  2.  If you choose to wash your hair, wash your hair first as usual with your  normal  shampoo.  3.  After you shampoo, rinse your hair and body thoroughly to remove the  shampoo.                           4.  Use CHG as you would any other liquid soap.  You can apply chg directly  to the skin and wash  Gently with a scrungie or clean washcloth.  5.  Apply the CHG Soap to your body ONLY FROM THE NECK DOWN.   Do not use on face/ open                           Wound or open sores. Avoid contact with eyes, ears mouth and genitals (private parts).                       Wash face,  Genitals (private parts) with your normal soap.             6.  Wash thoroughly, paying special attention to the area where your surgery  will be performed.  7.  Thoroughly rinse your body with warm water from the neck down.  8.  DO NOT shower/wash with your normal soap after using and rinsing off  the CHG Soap.                9.  Pat yourself dry with a clean towel.            10.  Wear clean pajamas.            11.  Place clean sheets on your bed the night of your first shower and do not  sleep with pets. Day of Surgery : Do not apply any lotions/deodorants the morning of surgery.  Please wear clean clothes to the hospital/surgery center.  FAILURE TO FOLLOW THESE INSTRUCTIONS MAY RESULT IN THE CANCELLATION OF YOUR SURGERY PATIENT SIGNATURE_________________________________  NURSE SIGNATURE__________________________________  ________________________________________________________________________   Adam Phenix  An incentive spirometer is a tool that can help keep your lungs clear and active. This tool measures how well you are filling your lungs with each breath.  Taking long deep breaths may help reverse or decrease the chance of developing breathing (pulmonary) problems (especially infection) following:  A long period of time when you are unable to move or be active. BEFORE THE PROCEDURE   If the spirometer includes an indicator to show your best effort, your nurse or respiratory therapist will set it to a desired goal.  If possible, sit up straight or lean slightly forward. Try not to slouch.  Hold the incentive spirometer in an upright position. INSTRUCTIONS FOR USE  1. Sit on the edge of your bed if possible, or sit up as far as you can in bed or on a chair. 2. Hold the incentive spirometer in an upright position. 3. Breathe out normally. 4. Place the mouthpiece in your mouth and seal your lips tightly around it. 5. Breathe in slowly and as deeply as possible, raising the piston or the ball toward the top of the column. 6. Hold your breath for 3-5 seconds or for as long as possible. Allow the piston or ball to fall to the bottom of the column. 7. Remove the mouthpiece from your mouth and breathe out normally. 8. Rest for a few seconds and repeat Steps 1 through 7 at least 10 times every 1-2 hours when you are awake. Take your time and take a few normal breaths between deep breaths. 9. The spirometer may include an indicator to show your best effort. Use the indicator as a goal to work toward during each repetition. 10. After each set of 10 deep breaths, practice coughing to be sure your lungs are clear. If you have an incision (the cut made at the time of surgery),  support your incision when coughing by placing a pillow or rolled up towels firmly against it. Once you are able to get out of bed, walk around indoors and cough well. You may stop using the incentive spirometer when instructed by your caregiver.  RISKS AND COMPLICATIONS  Take your time so you do not get dizzy or light-headed.  If you are in pain, you may need to take or ask for pain  medication before doing incentive spirometry. It is harder to take a deep breath if you are having pain. AFTER USE  Rest and breathe slowly and easily.  It can be helpful to keep track of a log of your progress. Your caregiver can provide you with a simple table to help with this. If you are using the spirometer at home, follow these instructions: [place] IF:   You are having difficultly using the spirometer.  You have trouble using the spirometer as often as instructed.  Your pain medication is not giving enough relief while using the spirometer.  You develop fever of 100.5 F (38.1 C) or higher. SEEK IMMEDIATE MEDICAL CARE IF:   You cough up bloody sputum that had not been present before.  You develop fever of 102 F (38.9 C) or greater.  You develop worsening pain at or near the incision site. MAKE SURE YOU:   Understand these instructions.  Will watch your condition.  Will get help right away if you are not doing well or get worse. Document Released: 07/17/2006 Document Revised: 05/29/2011 Document Reviewed: 09/17/2006 [facility] Patient Information 2014 [place], [place].   ________________________________________________________________________

## 2014-04-20 ENCOUNTER — Ambulatory Visit (HOSPITAL_COMMUNITY)
Admission: RE | Admit: 2014-04-20 | Discharge: 2014-04-20 | Disposition: A | Discharge status: Home / Self Care | Payer: Worker's Compensation | Source: Ambulatory Visit | Attending: Anesthesiology | Admitting: Anesthesiology

## 2014-04-20 ENCOUNTER — Encounter: Payer: Self-pay | Admitting: Internal Medicine

## 2014-04-20 ENCOUNTER — Other Ambulatory Visit (HOSPITAL_COMMUNITY): Payer: Self-pay | Admitting: *Deleted

## 2014-04-20 ENCOUNTER — Encounter (HOSPITAL_COMMUNITY): Payer: Self-pay

## 2014-04-20 ENCOUNTER — Encounter (HOSPITAL_COMMUNITY)
Admission: RE | Admit: 2014-04-20 | Discharge: 2014-04-20 | Disposition: A | Discharge status: Home / Self Care | Payer: Worker's Compensation | Source: Ambulatory Visit | Attending: Specialist | Admitting: Specialist

## 2014-04-20 DIAGNOSIS — I10 Essential (primary) hypertension: Secondary | ICD-10-CM | POA: Insufficient documentation

## 2014-04-20 DIAGNOSIS — R0989 Other specified symptoms and signs involving the circulatory and respiratory systems: Secondary | ICD-10-CM | POA: Insufficient documentation

## 2014-04-20 DIAGNOSIS — Z01818 Encounter for other preprocedural examination: Secondary | ICD-10-CM | POA: Diagnosis present

## 2014-04-20 DIAGNOSIS — G4733 Obstructive sleep apnea (adult) (pediatric): Secondary | ICD-10-CM | POA: Insufficient documentation

## 2014-04-20 LAB — CBC
HCT: 39.3 % (ref 39.0–52.0)
Hemoglobin: 13.1 g/dL (ref 13.0–17.0)
MCH: 29.2 pg (ref 26.0–34.0)
MCHC: 33.3 g/dL (ref 30.0–36.0)
MCV: 87.5 fL (ref 78.0–100.0)
Platelets: 212 10*3/uL (ref 150–400)
RBC: 4.49 MIL/uL (ref 4.22–5.81)
RDW: 13.7 % (ref 11.5–15.5)
WBC: 8.2 10*3/uL (ref 4.0–10.5)

## 2014-04-20 NOTE — Progress Notes (Signed)
   04/20/14 1357  OBSTRUCTIVE SLEEP APNEA  Have you ever been diagnosed with sleep apnea through a sleep study? No  Do you snore loudly (loud enough to be heard through closed doors)?  0  Do you often feel tired, fatigued, or sleepy during the daytime? 1  Has anyone observed you stop breathing during your sleep? 0  Do you have, or are you being treated for high blood pressure? 1  BMI more than 35 kg/m2? 0  Age over 71 years old? 1  Neck circumference greater than 40 cm/16 inches? 1  Gender: 1  Obstructive Sleep Apnea Score 5  Score 4 or greater  Results sent to PCP

## 2014-04-21 NOTE — Progress Notes (Signed)
Final EKG done 04/20/14 in [facility]

## 2014-04-23 ENCOUNTER — Encounter (HOSPITAL_COMMUNITY)
Admission: RE | Disposition: A | Discharge status: Home / Self Care | Payer: Self-pay | Source: Ambulatory Visit | Attending: Specialist

## 2014-04-23 ENCOUNTER — Encounter (HOSPITAL_COMMUNITY): Payer: Self-pay | Admitting: *Deleted

## 2014-04-23 ENCOUNTER — Ambulatory Visit (HOSPITAL_COMMUNITY): Payer: Worker's Compensation | Admitting: Anesthesiology

## 2014-04-23 ENCOUNTER — Ambulatory Visit (HOSPITAL_COMMUNITY)
Admission: RE | Admit: 2014-04-23 | Discharge: 2014-04-24 | Disposition: A | Discharge status: Home / Self Care | Payer: Worker's Compensation | Source: Ambulatory Visit | Attending: Specialist | Admitting: Specialist

## 2014-04-23 DIAGNOSIS — M7062 Trochanteric bursitis, left hip: Secondary | ICD-10-CM

## 2014-04-23 DIAGNOSIS — M7070 Other bursitis of hip, unspecified hip: Secondary | ICD-10-CM | POA: Diagnosis present

## 2014-04-23 DIAGNOSIS — M71552 Other bursitis, not elsewhere classified, left hip: Secondary | ICD-10-CM | POA: Diagnosis not present

## 2014-04-23 DIAGNOSIS — N433 Hydrocele, unspecified: Secondary | ICD-10-CM | POA: Insufficient documentation

## 2014-04-23 DIAGNOSIS — I10 Essential (primary) hypertension: Secondary | ICD-10-CM | POA: Diagnosis not present

## 2014-04-23 DIAGNOSIS — Z96642 Presence of left artificial hip joint: Secondary | ICD-10-CM | POA: Diagnosis not present

## 2014-04-23 DIAGNOSIS — E119 Type 2 diabetes mellitus without complications: Secondary | ICD-10-CM | POA: Insufficient documentation

## 2014-04-23 DIAGNOSIS — E785 Hyperlipidemia, unspecified: Secondary | ICD-10-CM | POA: Diagnosis not present

## 2014-04-23 HISTORY — PX: EXCISION/RELEASE BURSA HIP: SHX5014

## 2014-04-23 LAB — CBC
HCT: 39.6 % (ref 39.0–52.0)
Hemoglobin: 13 g/dL (ref 13.0–17.0)
MCH: 28.3 pg (ref 26.0–34.0)
MCHC: 32.8 g/dL (ref 30.0–36.0)
MCV: 86.3 fL (ref 78.0–100.0)
Platelets: 198 10*3/uL (ref 150–400)
RBC: 4.59 MIL/uL (ref 4.22–5.81)
RDW: 13.6 % (ref 11.5–15.5)
WBC: 10.3 10*3/uL (ref 4.0–10.5)

## 2014-04-23 LAB — GLUCOSE, CAPILLARY
Glucose-Capillary: 127 mg/dL — ABNORMAL HIGH (ref 70–99)
Glucose-Capillary: 121 mg/dL — ABNORMAL HIGH (ref 70–99)
Glucose-Capillary: 147 mg/dL — ABNORMAL HIGH (ref 70–99)
Glucose-Capillary: 291 mg/dL — ABNORMAL HIGH (ref 70–99)
Glucose-Capillary: 198 mg/dL — ABNORMAL HIGH (ref 70–99)

## 2014-04-23 LAB — CREATININE, SERUM
Creatinine, Ser: 0.9 mg/dL (ref 0.50–1.35)
GFR calc Af Amer: >90 mL/min (ref >90)
GFR calc non Af Amer: 85 mL/min — ABNORMAL LOW (ref >90)

## 2014-04-23 SURGERY — RELEASE, BURSA, TROCHANTERIC
Anesthesia: General | Site: Hip | Laterality: Left

## 2014-04-23 MED ORDER — FENTANYL CITRATE 0.05 MG/ML IJ SOLN
INTRAMUSCULAR | Status: DC | PRN
  Administered 2014-04-23: 50 ug via INTRAVENOUS
  Administered 2014-04-23: 100 ug via INTRAVENOUS

## 2014-04-23 MED ORDER — NEOSTIGMINE METHYLSULFATE 10 MG/10ML IV SOLN
INTRAVENOUS | Status: AC
  Filled 2014-04-23: qty 1

## 2014-04-23 MED ORDER — METFORMIN HCL 500 MG PO TABS
500.0000 mg | ORAL_TABLET | Freq: Two times a day (BID) | ORAL | Status: DC
  Administered 2014-04-23 – 2014-04-24 (×2): 500 mg via ORAL
  Filled 2014-04-23 (×4): qty 1

## 2014-04-23 MED ORDER — PROPOFOL 10 MG/ML IV BOLUS
INTRAVENOUS | Status: DC | PRN
  Administered 2014-04-23: 200 mg via INTRAVENOUS

## 2014-04-23 MED ORDER — PHENOL 1.4 % MT LIQD
1.0000 | OROMUCOSAL | Status: DC | PRN
Start: 2014-04-23 — End: 2014-04-24

## 2014-04-23 MED ORDER — PHENYLEPHRINE 40 MCG/ML (10ML) SYRINGE FOR IV PUSH (FOR BLOOD PRESSURE SUPPORT)
PREFILLED_SYRINGE | INTRAVENOUS | Status: AC
  Filled 2014-04-23: qty 10

## 2014-04-23 MED ORDER — METOCLOPRAMIDE HCL 10 MG PO TABS: 5.0000 mg | ORAL_TABLET | Freq: Three times a day (TID) | ORAL | Status: DC | PRN

## 2014-04-23 MED ORDER — METOCLOPRAMIDE HCL 5 MG/ML IJ SOLN: 5.0000 mg | Freq: Three times a day (TID) | INTRAMUSCULAR | Status: DC | PRN

## 2014-04-23 MED ORDER — FENTANYL CITRATE 0.05 MG/ML IJ SOLN
25.0000 ug | INTRAMUSCULAR | Status: DC | PRN
Start: 2014-04-23 — End: 2014-04-23

## 2014-04-23 MED ORDER — MIDAZOLAM HCL 5 MG/5ML IJ SOLN
INTRAMUSCULAR | Status: DC | PRN
  Administered 2014-04-23: 2 mg via INTRAVENOUS

## 2014-04-23 MED ORDER — PROPOFOL 10 MG/ML IV BOLUS
INTRAVENOUS | Status: AC
  Filled 2014-04-23: qty 20

## 2014-04-23 MED ORDER — ONDANSETRON HCL 4 MG/2ML IJ SOLN
INTRAMUSCULAR | Status: DC | PRN
  Administered 2014-04-23: 4 mg via INTRAVENOUS

## 2014-04-23 MED ORDER — METHOCARBAMOL 1000 MG/10ML IJ SOLN
500.0000 mg | Freq: Four times a day (QID) | INTRAVENOUS | Status: DC | PRN
  Filled 2014-04-23: qty 5

## 2014-04-23 MED ORDER — GLYCOPYRROLATE 0.2 MG/ML IJ SOLN
INTRAMUSCULAR | Status: AC
  Filled 2014-04-23: qty 3

## 2014-04-23 MED ORDER — ACETAMINOPHEN 325 MG PO TABS: 650.0000 mg | ORAL_TABLET | Freq: Four times a day (QID) | ORAL | Status: DC | PRN

## 2014-04-23 MED ORDER — BUPIVACAINE-EPINEPHRINE (PF) 0.5% -1:200000 IJ SOLN
INTRAMUSCULAR | Status: AC
  Filled 2014-04-23: qty 30

## 2014-04-23 MED ORDER — SODIUM CHLORIDE 0.9 % IR SOLN
Status: DC | PRN
  Administered 2014-04-23: 500 mL

## 2014-04-23 MED ORDER — GLYCOPYRROLATE 0.2 MG/ML IJ SOLN
INTRAMUSCULAR | Status: DC | PRN
  Administered 2014-04-23: 0.6 mg via INTRAVENOUS

## 2014-04-23 MED ORDER — LISINOPRIL 20 MG PO TABS
20.0000 mg | ORAL_TABLET | Freq: Two times a day (BID) | ORAL | Status: DC
  Administered 2014-04-23 – 2014-04-24 (×2): 20 mg via ORAL
  Filled 2014-04-23 (×3): qty 1

## 2014-04-23 MED ORDER — PROMETHAZINE HCL 25 MG/ML IJ SOLN: 6.2500 mg | INTRAMUSCULAR | Status: DC | PRN

## 2014-04-23 MED ORDER — LACTATED RINGERS IV SOLN
INTRAVENOUS | Status: DC
  Administered 2014-04-23 (×2): 1000 mL via INTRAVENOUS

## 2014-04-23 MED ORDER — CEFAZOLIN SODIUM-DEXTROSE 2-3 GM-% IV SOLR
2.0000 g | INTRAVENOUS | Status: AC
  Administered 2014-04-23: 2 g via INTRAVENOUS

## 2014-04-23 MED ORDER — INSULIN ASPART 100 UNIT/ML ~~LOC~~ SOLN
0.0000 [IU] | Freq: Three times a day (TID) | SUBCUTANEOUS | Status: DC
  Administered 2014-04-23: 5 [IU] via SUBCUTANEOUS
  Administered 2014-04-24: 1 [IU] via SUBCUTANEOUS

## 2014-04-23 MED ORDER — RISAQUAD PO CAPS
2.0000 | ORAL_CAPSULE | Freq: Every day | ORAL | Status: DC
  Administered 2014-04-23 – 2014-04-24 (×2): 2 via ORAL
  Filled 2014-04-23 (×2): qty 2

## 2014-04-23 MED ORDER — AMLODIPINE BESYLATE 5 MG PO TABS
5.0000 mg | ORAL_TABLET | Freq: Every morning | ORAL | Status: DC
  Administered 2014-04-24: 5 mg via ORAL
  Filled 2014-04-23: qty 1

## 2014-04-23 MED ORDER — LACTATED RINGERS IV SOLN: INTRAVENOUS | Status: DC

## 2014-04-23 MED ORDER — GLIMEPIRIDE 2 MG PO TABS
2.0000 mg | ORAL_TABLET | Freq: Every day | ORAL | Status: DC
  Administered 2014-04-24: 2 mg via ORAL
  Filled 2014-04-23 (×2): qty 1

## 2014-04-23 MED ORDER — ENOXAPARIN SODIUM 40 MG/0.4ML ~~LOC~~ SOLN
40.0000 mg | SUBCUTANEOUS | Status: DC
  Administered 2014-04-24: 40 mg via SUBCUTANEOUS
  Filled 2014-04-23 (×2): qty 0.4

## 2014-04-23 MED ORDER — ONDANSETRON HCL 4 MG/2ML IJ SOLN
INTRAMUSCULAR | Status: AC
  Filled 2014-04-23: qty 2

## 2014-04-23 MED ORDER — SUCCINYLCHOLINE CHLORIDE 20 MG/ML IJ SOLN
INTRAMUSCULAR | Status: DC | PRN
  Administered 2014-04-23: 100 mg via INTRAVENOUS

## 2014-04-23 MED ORDER — MEPERIDINE HCL 50 MG/ML IJ SOLN
6.2500 mg | INTRAMUSCULAR | Status: DC | PRN
Start: 2014-04-23 — End: 2014-04-23

## 2014-04-23 MED ORDER — ONDANSETRON HCL 4 MG/2ML IJ SOLN: 4.0000 mg | Freq: Four times a day (QID) | INTRAMUSCULAR | Status: DC | PRN

## 2014-04-23 MED ORDER — OXYCODONE HCL 5 MG PO TABS
5.0000 mg | ORAL_TABLET | ORAL | Status: DC | PRN
  Administered 2014-04-23: 10 mg via ORAL
  Administered 2014-04-23: 5 mg via ORAL
  Administered 2014-04-23 – 2014-04-24 (×2): 10 mg via ORAL
  Filled 2014-04-23 (×2): qty 2
  Filled 2014-04-23: qty 1
  Filled 2014-04-23 (×2): qty 2

## 2014-04-23 MED ORDER — DEXAMETHASONE SODIUM PHOSPHATE 10 MG/ML IJ SOLN
INTRAMUSCULAR | Status: DC | PRN
  Administered 2014-04-23: 10 mg via INTRAVENOUS

## 2014-04-23 MED ORDER — ONDANSETRON HCL 4 MG PO TABS
4.0000 mg | ORAL_TABLET | Freq: Four times a day (QID) | ORAL | Status: DC | PRN
Start: 2014-04-23 — End: 2014-04-24

## 2014-04-23 MED ORDER — HYDROMORPHONE HCL 1 MG/ML IJ SOLN
1.0000 mg | INTRAMUSCULAR | Status: DC | PRN
  Administered 2014-04-23: 1 mg via INTRAVENOUS
  Filled 2014-04-23: qty 1

## 2014-04-23 MED ORDER — PHENYLEPHRINE HCL 10 MG/ML IJ SOLN
INTRAMUSCULAR | Status: DC | PRN
Start: 2014-04-23 — End: 2014-04-23
  Administered 2014-04-23 (×2): 80 ug via INTRAVENOUS

## 2014-04-23 MED ORDER — ROCURONIUM BROMIDE 100 MG/10ML IV SOLN
INTRAVENOUS | Status: DC | PRN
  Administered 2014-04-23: 30 mg via INTRAVENOUS

## 2014-04-23 MED ORDER — NEOSTIGMINE METHYLSULFATE 10 MG/10ML IV SOLN
INTRAVENOUS | Status: DC | PRN
  Administered 2014-04-23: 5 mg via INTRAVENOUS

## 2014-04-23 MED ORDER — METHOCARBAMOL 500 MG PO TABS: 500.0000 mg | ORAL_TABLET | Freq: Four times a day (QID) | ORAL | Status: DC | PRN

## 2014-04-23 MED ORDER — BETAXOLOL HCL 0.25 % OP SUSP
1.0000 [drp] | Freq: Two times a day (BID) | OPHTHALMIC | Status: DC
  Administered 2014-04-23 – 2014-04-24 (×2): 1 [drp] via OPHTHALMIC
  Filled 2014-04-23: qty 10

## 2014-04-23 MED ORDER — CEFAZOLIN SODIUM-DEXTROSE 2-3 GM-% IV SOLR
2.0000 g | Freq: Four times a day (QID) | INTRAVENOUS | Status: AC
  Administered 2014-04-23 (×2): 2 g via INTRAVENOUS
  Filled 2014-04-23 (×2): qty 50

## 2014-04-23 MED ORDER — SAW PALMETTO 160 MG PO CAPS: ORAL_CAPSULE | Freq: Every morning | ORAL | Status: DC

## 2014-04-23 MED ORDER — BUPIVACAINE-EPINEPHRINE (PF) 0.5% -1:200000 IJ SOLN
INTRAMUSCULAR | Status: DC | PRN
  Administered 2014-04-23: 14 mL

## 2014-04-23 MED ORDER — CARVEDILOL 25 MG PO TABS
25.0000 mg | ORAL_TABLET | Freq: Two times a day (BID) | ORAL | Status: DC
  Administered 2014-04-23 – 2014-04-24 (×2): 25 mg via ORAL
  Filled 2014-04-23 (×4): qty 1

## 2014-04-23 MED ORDER — LIDOCAINE HCL (CARDIAC) 20 MG/ML IV SOLN
INTRAVENOUS | Status: DC | PRN
  Administered 2014-04-23: 100 mg via INTRAVENOUS

## 2014-04-23 MED ORDER — DEXAMETHASONE SODIUM PHOSPHATE 10 MG/ML IJ SOLN
INTRAMUSCULAR | Status: AC
  Filled 2014-04-23: qty 1

## 2014-04-23 MED ORDER — ACETAMINOPHEN 650 MG RE SUPP: 650.0000 mg | Freq: Four times a day (QID) | RECTAL | Status: DC | PRN

## 2014-04-23 MED ORDER — MIDAZOLAM HCL 2 MG/2ML IJ SOLN
INTRAMUSCULAR | Status: AC
  Filled 2014-04-23: qty 2

## 2014-04-23 MED ORDER — SODIUM CHLORIDE 0.9 % IR SOLN
Status: AC
  Filled 2014-04-23: qty 1

## 2014-04-23 MED ORDER — CEFAZOLIN SODIUM-DEXTROSE 2-3 GM-% IV SOLR
INTRAVENOUS | Status: AC
  Filled 2014-04-23: qty 50

## 2014-04-23 MED ORDER — FENTANYL CITRATE 0.05 MG/ML IJ SOLN
INTRAMUSCULAR | Status: AC
Start: 2014-04-23 — End: 2014-04-23
  Filled 2014-04-23: qty 5

## 2014-04-23 MED ORDER — MENTHOL 3 MG MT LOZG: 1.0000 | LOZENGE | OROMUCOSAL | Status: DC | PRN

## 2014-04-23 SURGICAL SUPPLY — 33 items
BAG SPEC THK2 15X12 ZIP CLS (MISCELLANEOUS)
BAG ZIPLOCK 12X15 (MISCELLANEOUS) IMPLANT
CLOTH 2% CHLOROHEXIDINE 3PK (PERSONAL CARE ITEMS) ×2 IMPLANT
DRAPE ORTHO SPLIT 77X108 STRL (DRAPES) ×4
DRAPE SURG ORHT 6 SPLT 77X108 (DRAPES) ×1 IMPLANT
DRAPE U-SHAPE 47X51 STRL (DRAPES) ×2 IMPLANT
DRSG AQUACEL AG ADV 3.5X 6 (GAUZE/BANDAGES/DRESSINGS) ×1 IMPLANT
DURAPREP 26ML APPLICATOR (WOUND CARE) ×1 IMPLANT
ELECT REM PT RETURN 9FT ADLT (ELECTROSURGICAL) ×2
ELECTRODE REM PT RTRN 9FT ADLT (ELECTROSURGICAL) ×1 IMPLANT
GLOVE BIOGEL PI IND STRL 7.5 (GLOVE) ×1 IMPLANT
GLOVE BIOGEL PI IND STRL 8 (GLOVE) ×1 IMPLANT
GLOVE BIOGEL PI INDICATOR 7.5 (GLOVE) ×2
GLOVE BIOGEL PI INDICATOR 8 (GLOVE) ×1
GLOVE SURG SS PI 7.0 STRL IVOR (GLOVE) ×1 IMPLANT
GLOVE SURG SS PI 7.5 STRL IVOR (GLOVE) ×2 IMPLANT
GLOVE SURG SS PI 8.0 STRL IVOR (GLOVE) ×3 IMPLANT
GOWN STRL REUS W/ TWL LRG LVL3 (GOWN DISPOSABLE) IMPLANT
GOWN STRL REUS W/TWL LRG LVL3 (GOWN DISPOSABLE) ×2
GOWN STRL REUS W/TWL XL LVL3 (GOWN DISPOSABLE) ×4 IMPLANT
MANIFOLD NEPTUNE II (INSTRUMENTS) ×2 IMPLANT
NS IRRIG 1000ML POUR BTL (IV SOLUTION) ×1 IMPLANT
PACK TOTAL JOINT (CUSTOM PROCEDURE TRAY) ×2 IMPLANT
POSITIONER SURGICAL ARM (MISCELLANEOUS) ×2 IMPLANT
STAPLER VISISTAT (STAPLE) ×2 IMPLANT
STAPLER VISISTAT 35W (STAPLE) ×1 IMPLANT
STRIP CLOSURE SKIN 1/2X4 (GAUZE/BANDAGES/DRESSINGS) ×2 IMPLANT
SUT ETHIBOND NAB CT1 #1 30IN (SUTURE) ×1 IMPLANT
SUT MNCRL AB 4-0 PS2 18 (SUTURE) ×1 IMPLANT
SUT VIC AB 1 CT1 27 (SUTURE)
SUT VIC AB 1 CT1 27XBRD ANTBC (SUTURE) ×3 IMPLANT
SUT VIC AB 2-0 CT1 27 (SUTURE) ×2
SUT VIC AB 2-0 CT1 TAPERPNT 27 (SUTURE) ×3 IMPLANT

## 2014-04-23 NOTE — Care Management Note (Unsigned)
    Page 1 of 1   04/23/2014     2:30:31 PM CARE MANAGEMENT NOTE 04/23/2014  Patient:  KALVIN, BUSS   Account Number:  [id number]  Date Initiated:  04/23/2014  Documentation initiated by:  [facility]  Subjective/Objective Assessment:   adm: EXCISION OF LEFT HIP TROCHANTERIC BURSA (Left)     Action/Plan:   discharge planning   Anticipated DC Date:     Anticipated DC Plan:           Choice offered to / List presented to:             Status of service:   Medicare Important Message given?   (If response is "NO", the following Medicare IM given date fields will be blank) Date Medicare IM given:   Medicare IM given by:   Date Additional Medicare IM given:   Additional Medicare IM given by:    Discharge Disposition:    Per UR Regulation:    If discussed at Long Length of Stay Meetings, dates discussed:    Comments:  04/23/14 14:20 Cm met with pt in room to confirm any changes in his WC; pt states everything is the same:  Claim#[id number]/[employer] Ins-[address] [id number], [place] [address] Patty Cantrell-[phone] Pt. lives with his wife and states he has crutches, walker, cane and tub bench at home.  CM waiting for PT/OT EVAL for [facility] needs.  Mariane Masters, BSN, CM [phone].

## 2014-04-23 NOTE — Transfer of Care (Signed)
Immediate Anesthesia Transfer of Care Note  Patient: Donald Carlson  Procedure(s) Performed: Procedure(s): EXCISION OF LEFT HIP TROCHANTERIC BURSA (Left)  Patient Location: PACU  Anesthesia Type:General  Level of Consciousness: sedated  Airway & Oxygen Therapy: Patient Spontanous Breathing and Patient connected to face mask oxygen  Post-op Assessment: Report given to RN and Post -op Vital signs reviewed and stable  Post vital signs: Reviewed and stable  Last Vitals:  Filed Vitals:   04/23/14 0734  BP: 158/86  Pulse: 66  Temp: 36.6 C  Resp: 16    Complications: No apparent anesthesia complications

## 2014-04-23 NOTE — Brief Op Note (Signed)
04/23/2014  10:09 AM  PATIENT:  Leary Roca  69 y.o. male  PRE-OPERATIVE DIAGNOSIS:  TROCHANTERIC BURSITIS LEFT HIP  POST-OPERATIVE DIAGNOSIS:  TROCHANTERIC BURSITIS LEFT HIP  PROCEDURE:  Procedure(s): EXCISION OF LEFT HIP TROCHANTERIC BURSA (Left)  SURGEON:  Surgeon(s) and Role:    * Johnn Hai, MD - Primary  PHYSICIAN ASSISTANT:   ASSISTANTS: [place]   ANESTHESIA:   general  EBL:     BLOOD ADMINISTERED:none  DRAINS: none   LOCAL MEDICATIONS USED:  MARCAINE     SPECIMEN:  No Specimen  DISPOSITION OF SPECIMEN:  N/A  COUNTS:  YES  TOURNIQUET:  * No tourniquets in log *  DICTATION: .Other Dictation: Dictation Number [id number]  PLAN OF CARE: Admit for overnight observation  PATIENT DISPOSITION:  PACU - hemodynamically stable.   Delay start of Pharmacological VTE agent (>24hrs) due to surgical blood loss or risk of bleeding: no

## 2014-04-23 NOTE — Evaluation (Signed)
Physical Therapy Evaluation Patient Details Name: Donald Carlson MRN: [id number] DOB: 07-Jul-1945 Today's Date: 04/23/2014   History of Present Illness    EXCISION OF LEFT HIP TROCHANTERIC BURSA  Clinical Impression  Pt s/p L hip bursectomy presents with functional mobility limitations 2* post op pain and PWB status.  Pt should progress to d.c home with family assist.   Follow Up Recommendations Home health PT    Equipment Recommendations  None recommended by PT    Recommendations for Other Services OT consult     Precautions / Restrictions Precautions Precautions: Fall Restrictions Weight Bearing Restrictions: Yes LLE Weight Bearing: Partial weight bearing LLE Partial Weight Bearing Percentage or Pounds: 50%      Mobility  Bed Mobility Overal bed mobility: Needs Assistance Bed Mobility: Supine to Sit     Supine to sit: Min assist     General bed mobility comments: cues for sequence and use of UEs to self assist  Transfers Overall transfer level: Needs assistance Equipment used: Rolling walker (2 wheeled) Transfers: Sit to/from Stand Sit to Stand: Min assist         General transfer comment: cues for LE management and use of UEs to self assist  Ambulation/Gait Ambulation/Gait assistance: Min assist Ambulation Distance (Feet): 75 Feet Assistive device: Rolling walker (2 wheeled) Gait Pattern/deviations: Step-to pattern;Decreased step length - right;Decreased step length - left;Shuffle;Trunk flexed Gait velocity: decr   General Gait Details: cues for posture, sequence, position from RW and PWB on L  Stairs            Wheelchair Mobility    Modified Rankin (Stroke Patients Only)       Balance                                             Pertinent Vitals/Pain Pain Assessment: 0-10 Pain Score: 4  Pain Location: L hip Pain Descriptors / Indicators: Sore Pain Intervention(s): Limited activity within patient's  tolerance;Monitored during session;Premedicated before session;Ice applied    Home Living Family/patient expects to be discharged to:: Private residence Living Arrangements: Spouse/significant other Available Help at Discharge: Family Type of Home: House Home Access: Stairs to enter Entrance Stairs-Rails: None Technical brewer of Steps: 2 Home Layout: One level Home Equipment: Environmental consultant - 2 wheels      Prior Function Level of Independence: Independent               Hand Dominance        Extremity/Trunk Assessment   Upper Extremity Assessment: Overall WFL for tasks assessed           Lower Extremity Assessment: LLE deficits/detail      Cervical / Trunk Assessment: Normal  Communication   Communication: No difficulties  Cognition Arousal/Alertness: Awake/alert Behavior During Therapy: WFL for tasks assessed/performed Overall Cognitive Status: Within Functional Limits for tasks assessed                      General Comments      Exercises        Assessment/Plan    PT Assessment Patient needs continued PT services  PT Diagnosis Difficulty walking   PT Problem List Decreased strength;Decreased range of motion;Decreased activity tolerance;Decreased mobility;Decreased knowledge of use of DME;Pain;Decreased knowledge of precautions  PT Treatment Interventions DME instruction;Gait training;Stair training;Functional mobility training;Therapeutic activities;Therapeutic exercise;Patient/family education   PT Goals (Current  goals can be found in the Care Plan section) Acute Rehab PT Goals Patient Stated Goal: Resume previous lifestyle with decreased pain PT Goal Formulation: With patient Time For Goal Achievement: 04/28/14 Potential to Achieve Goals: Good    Frequency 7X/week   Barriers to discharge        Co-evaluation               End of Session Equipment Utilized During Treatment: Gait belt Activity Tolerance: Patient tolerated  treatment well Patient left: in chair;with call bell/phone within reach;with family/visitor present Nurse Communication: Mobility status    Functional Assessment Tool Used: clinical judgement Functional Limitation: Mobility: Walking and moving around Mobility: Walking and Moving Around Current Status [phone]): At least 20 percent but less than 40 percent impaired, limited or restricted Mobility: Walking and Moving Around Goal Status [phone]): At least 1 percent but less than 20 percent impaired, limited or restricted    Time: 1540-1613 PT Time Calculation (min) (ACUTE ONLY): 33 min   Charges:   PT Evaluation $Initial PT Evaluation Tier I: 1 Procedure PT Treatments $Gait Training: 8-22 mins   PT G Codes:   PT G-Codes **NOT FOR INPATIENT CLASS** Functional Assessment Tool Used: clinical judgement Functional Limitation: Mobility: Walking and moving around Mobility: Walking and Moving Around Current Status ([id number]): At least 20 percent but less than 40 percent impaired, limited or restricted Mobility: Walking and Moving Around Goal Status [phone]): At least 1 percent but less than 20 percent impaired, limited or restricted    Sharad Vaneaton 04/23/2014, 5:02 PM

## 2014-04-23 NOTE — Anesthesia Preprocedure Evaluation (Addendum)
Anesthesia Evaluation  Patient identified by MRN, date of birth, ID band Patient awake    Reviewed: Allergy & Precautions, H&P , NPO status , Patient's Chart, lab work & pertinent test results, reviewed documented beta blocker date and time   Airway Mallampati: II  TM Distance: >3 FB Neck ROM: full    Dental no notable dental hx.    Pulmonary neg pulmonary ROS,  breath sounds clear to auscultation  Pulmonary exam normal       Cardiovascular hypertension, Pt. on home beta blockers and Pt. on medications Rhythm:regular Rate:Normal     Neuro/Psych Glaucoma.  Back surgery negative neurological ROS  negative psych ROS   GI/Hepatic negative GI ROS, Neg liver ROS,   Endo/Other  diabetes, Well Controlled, Type 2, Oral Hypoglycemic Agents  Renal/GU negative Renal ROS  negative genitourinary   Musculoskeletal   Abdominal   Peds  Hematology negative hematology ROS (+)   Anesthesia Other Findings   Reproductive/Obstetrics negative OB ROS                             Anesthesia Physical  Anesthesia Plan  ASA: III  Anesthesia Plan: General   Post-op Pain Management:    Induction: Intravenous  Airway Management Planned: Oral ETT  Additional Equipment:   Intra-op Plan:   Post-operative Plan: Extubation in OR  Informed Consent: I have reviewed the patients History and Physical, chart, labs and discussed the procedure including the risks, benefits and alternatives for the proposed anesthesia with the patient or authorized representative who has indicated his/her understanding and acceptance.   Dental Advisory Given  Plan Discussed with: CRNA and Surgeon  Anesthesia Plan Comments:         Anesthesia Quick Evaluation

## 2014-04-23 NOTE — Op Note (Signed)
Donald Carlson, Donald Carlson NO.:  [id number]  MEDICAL RECORD NO.:  [id number]  LOCATION:  5409                         FACILITY:  [facility]  PHYSICIAN:  Susa Day, M.D.    DATE OF BIRTH:  30-Oct-1945  DATE OF PROCEDURE:  04/23/2014 DATE OF DISCHARGE:                              OPERATIVE REPORT   PREOPERATIVE DIAGNOSIS:  Trochanteric bursitis, left hip.  POSTOPERATIVE DIAGNOSIS:  Trochanteric bursitis, adhesions, the left hip.  PROCEDURE PERFORMED: 1. Mini open excision of trochanteric bursa. 2. Lysis of adhesions of the tensor fascia.  ANESTHESIA:  General.  ASSISTANT:  Cleophas Dunker, PA.  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  He had done well initially and developed persistent trochanteric hip pain, had a corticosteroid injection.  It did not give him relief and I gave prohibitory elevation of his blood glucose being a diabetic.  For persistent disabling symptoms, we discussed excision of the trochanteric bursa.  Bursa risks and benefits discussed including bleeding, infection, DVT, PE, anesthetic complications, etc.  TECHNIQUE:  With the patient supine in position, after induction of adequate general anesthesia, 2 g Kefzol, placed in the right lateral decubitus position.  All bony prominences well padded.  Right hip and leg and peritrochanteric region was prepped and draped in usual sterile fashion.  Previous surgical incision was utilized the inferior half of that, full thickness flaps were developed.  Electrocautery was utilized to achieve hemostasis.  A 0.25% Marcaine with epinephrine was infiltrated in the subcutaneous tissues.  The adhesions noted.  We split the fascia lata in line with the skin incision at the tip of the trochanter.  Hypertrophic and exuberant bursa was noted.  This was excised purulent and then significant adhesions were noted between the tensor fascia and the vastus  lateralis.  These were lysed digitally beneath go between the tendon and the vastus lateralis, down to the insertion of the gluteus.  Sciatic nerve was palpated posteriorly. There was some adhesions also over the abductor proximally.  These were lysed as well.  Copiously irrigated.  Electrocautery was utilized to achieve hemostasis.  Following this, we have a good restoration of his range of motion, satisfactory lysis of adhesions and excision of the bursa.  Then in the adductor position, we repaired the fascia lata with #1 Ethibond interrupted figure-of-eight sutures, subcu with 2-0 and skin with staples.  Wound was dressed sterilely.  Placed supine, awoken without difficulty and transported to the recovery room in satisfactory condition.  The patient tolerated the procedure well with no complications.  Minimal blood loss.  Assistant, Cleophas Dunker, PA was used for holding the extremity, retraction, and closure.     Susa Day, M.D.     Geralynn Rile  D:  04/23/2014  T:  04/23/2014  Job:  [id number]

## 2014-04-23 NOTE — Anesthesia Postprocedure Evaluation (Signed)
  Anesthesia Post-op Note  Patient: Donald Carlson  Procedure(s) Performed: Procedure(s) (LRB): EXCISION OF LEFT HIP TROCHANTERIC BURSA (Left)  Patient Location: PACU  Anesthesia Type: General  Level of Consciousness: awake and alert   Airway and Oxygen Therapy: Patient Spontanous Breathing  Post-op Pain: mild  Post-op Assessment: Post-op Vital signs reviewed, Patient's Cardiovascular Status Stable, Respiratory Function Stable, Patent Airway and No signs of Nausea or vomiting  Last Vitals:  Filed Vitals:   04/23/14 1343  BP: 165/88  Pulse: 73  Temp: 36.5 C  Resp: 18    Post-op Vital Signs: stable   Complications: No apparent anesthesia complications\

## 2014-04-23 NOTE — Interval H&P Note (Signed)
History and Physical Interval Note:  04/23/2014 7:21 AM  Donald Carlson  has presented today for surgery, with the diagnosis of [place]  The various methods of treatment have been discussed with the patient and family. After consideration of risks, benefits and other options for treatment, the patient has consented to  Procedure(s): EXCISION OF [place] (Left) as a surgical intervention .  The patient's history has been reviewed, patient examined, no change in status, stable for surgery.  I have reviewed the patient's chart and labs.  Questions were answered to the patient's satisfaction.     Dustin Burrill C

## 2014-04-23 NOTE — H&P (View-Only) (Signed)
Donald Carlson is an 68 y.o. male.   Chief Complaint: Left hip pain HPI: The patient is a 69 year old male who presents today for follow up of their hip. The patient is being followed for their left hip. They are 14 month(s) out from a hemiarthroplasty and 21 months 1 1/2 weeks out from injury. . Symptoms reported today include: pain (when active). and report their pain level to be mild (to severe). Current treatment includes: home exercise program (prn) and activity modification. The following medication has been used for pain control: none. Note for "Follow-up Hip": The patient was placed on a 15lb. lifting restriction, no bending, stooping, or squatting, no prolonged sitting or standing, and no standing more than 1/3 of the day, but he is currently not working.  Donald Carlson follows up. He had his hydrocele looked at. The urologist does not feel it is part of his pain pattern. He reports mainly pain over the trochanter, intermittently secondary pain into the groin.  Past Medical History  Diagnosis Date  . Hypertension   . Diabetes mellitus   . Anemia   . Arthritis   . HERNIORRHAPHY, HX OF 01/07/2007    Qualifier: Diagnosis of  By: Ronnald Ramp CMA, Chemira    . Small bowel obstruction s/p open lysis of adhesions [id number] 08/23/2011  . Hyperlipidemia   . Kidney stones   . Skin cancer (melanoma) 1996    Left/ Back  . Bladder cancer 2012    scrapped bladder and inserted liquid chemo  . Pneumonia   . Gout     Past Surgical History  Procedure Laterality Date  . 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 Hernia repair    . Knee arthroscopy      Left knee  . Colonoscopy  07/2002    neg  . Lumbar laminectomy  2004  . Shoulder surgery  05/2008  . Laparotomy  08/23/2011    Procedure: EXPLORATORY LAPAROTOMY;  Surgeon: Edward Jolly, MD;  Location: [facility];  Service: General;  Laterality: N/A;  Lysis of Adhesions/small bowel obstruction  . Abdominal adhesion surgery  08/23/2011     Procedure: EXPLORATORY LAPAROTOMY;  Surgeon: Edward Jolly, MD;  Location: [facility];  Service: General;  Laterality: N/A;  Lysis of Adhesions/small bowel obstruction  . Hip pinning,cannulated Left 06/19/2012    Procedure: CANNULATED HIP PINNING;  Surgeon: Johnn Hai, MD;  Location: [facility];  Service: Orthopedics;  Laterality: Left;  . Fracture surgery    . Total hip arthroplasty Left 01/30/2013    Procedure: REMOVAL OF HARDWARE, LEFT HIP HEMIARTHROPLASTY;  Surgeon: Johnn Hai, MD;  Location: [facility];  Service: Orthopedics;  Laterality: Left;    Family History  Problem Relation Age of Onset  . Diabetes Mother   . Heart attack Mother 73  . Heart disease Mother   . Heart attack Brother 20  . Lung cancer Brother   . Emphysema Brother   . Cancer Brother     lung  . Cancer Brother     Lymph node/neck  . Breast cancer Sister   . Cancer Sister     breast   Social History:  reports that he has never smoked. He has never used smokeless tobacco. He reports that he does not drink alcohol or use illicit drugs.  Allergies: No Known Allergies   (Not in a hospital admission)  No results found for this or any previous visit (from the past 48 hour(s)). No results  found.  Review of Systems  Constitutional: Negative.   HENT: Negative.   Eyes: Negative.   Respiratory: Negative.   Cardiovascular: Negative.   Gastrointestinal: Negative.   Genitourinary: Negative.   Musculoskeletal: Positive for joint pain.  Skin: Negative.   Neurological: Negative.   Psychiatric/Behavioral: Negative.     There were no vitals taken for this visit. Physical Exam  Constitutional: He is oriented to person, place, and time. He appears well-developed and well-nourished.  HENT:  Head: Normocephalic and atraumatic.  Eyes: Conjunctivae and EOM are normal. Pupils are equal, round, and reactive to light.  Neck: Normal range of motion. Neck supple.  Cardiovascular: Normal rate and regular rhythm.    Respiratory: Effort normal and breath sounds normal.  GI: Soft. Bowel sounds are normal.  Musculoskeletal:  On exam he is exquisitely tender over the trochanter again. He walks with a slight antalgic gait. In a seated position he can flex, extend, and abduct the hip, and actually load the hip without pain other than exquisite tenderness. When he auto flexes the hip in the seated position that is where he gets pain.  Neurological: He is alert and oriented to person, place, and time. He has normal reflexes.  Skin: Skin is warm and dry.  Psychiatric: He has a normal mood and affect.    MRI indicated atrophy of the external rotators, scar tissue, no definitive pathology.  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 Intermittent groin pain with differential diagnosis that includes scar tissue, tendon injury versus early osteoarthrosis of the acetabulum.  Options include: 1. Do nothing. 2. Intermittent injection 3. Anti inflammatories and stretches.  He reports he has relegated more sedentary position because of his hip, therefore, discussed open bursectomy including the risks of bleeding and infection. I would do it as an outpatient, crutches for a day, and then continued ambulation. Increasing groin pain that is weightbearing, that may be an indication to revise to a total hip, but at this point I do not see that. I do feel it is related to his initial injury.  Plan Excision left trochanteric hip bursa  Shuayb Schepers M. PA-C for Dr. Tonita Cong 04/13/2014, 8:19 AM

## 2014-04-23 NOTE — Progress Notes (Signed)
Pt reports continued cough x 1 month.

## 2014-04-23 NOTE — Discharge Instructions (Signed)
Partial weightbearing (50%) left leg with walker for support Take Aspirin 325mg  once daily to prevent blood clots Aquacel dressing may remain in place until follow up in 2 weeks. May shower with aquacel in place but do not soak under water. If the dressing begins to peel up at the edges, may reinforce with tape, or may remove if peels off/becomes saturated, place a clean gauze and tape dressing which should be kept clean and dry and changed daily. Follow up in 2 weeks for staple removal

## 2014-04-24 ENCOUNTER — Encounter (HOSPITAL_COMMUNITY): Payer: Self-pay | Admitting: Specialist

## 2014-04-24 DIAGNOSIS — M71552 Other bursitis, not elsewhere classified, left hip: Secondary | ICD-10-CM | POA: Diagnosis not present

## 2014-04-24 LAB — GLUCOSE, CAPILLARY: Glucose-Capillary: 138 mg/dL — ABNORMAL HIGH (ref 70–99)

## 2014-04-24 MED ORDER — DOCUSATE SODIUM 100 MG PO CAPS: 100.0000 mg | ORAL_CAPSULE | Freq: Two times a day (BID) | ORAL | Status: DC

## 2014-04-24 MED ORDER — OXYCODONE-ACETAMINOPHEN 5-325 MG PO TABS: 1.0000 | ORAL_TABLET | ORAL | Status: DC | PRN

## 2014-04-24 MED ORDER — ASPIRIN EC 81 MG PO TBEC: 325.0000 mg | DELAYED_RELEASE_TABLET | Freq: Every day | ORAL | Status: DC

## 2014-04-24 NOTE — Progress Notes (Signed)
RN reviewed discharge instructions with patient and family. All questions answered.  Paperwork and prescriptions given.   NT rolled patient down in wheelchair to family car.  

## 2014-04-24 NOTE — Progress Notes (Signed)
Subjective: 1 Day Post-Op Procedure(s) (LRB): EXCISION OF LEFT HIP TROCHANTERIC BURSA (Left) Patient reports pain as mild.  Reports pain well controlled. Denies numbness or tingling. No other c/o. Feels ready to go home today.  Objective: Vital signs in last 24 hours: Temp:  [97.6 F (36.4 C)-98.4 F (36.9 C)] 97.6 F (36.4 C) (02/05 0602) Pulse Rate:  [61-80] 77 (02/05 0602) Resp:  [15-18] 18 (02/05 0602) BP: (129-186)/(77-104) 130/78 mmHg (02/05 0602) SpO2:  [95 %-100 %] 98 % (02/05 0602) Weight:  [95.255 kg (210 lb)] 95.255 kg (210 lb) (02/04 1138)  Intake/Output from previous day: 02/04 0701 - 02/05 0700 In: 1750 [P.O.:600; I.V.:1100; IV Piggyback:50] Out: 2850 [Urine:2850] Intake/Output this shift:     Recent Labs  04/23/14 1217  HGB 13.0    Recent Labs  04/23/14 1217  WBC 10.3  RBC 4.59  HCT 39.6  PLT 198    Recent Labs  04/23/14 1217  CREATININE 0.90   No results for input(s): LABPT, INR in the last 72 hours.  Neurologically intact ABD soft Neurovascular intact Sensation intact distally Intact pulses distally Dorsiflexion/Plantar flexion intact Incision: dressing C/D/I and no drainage No cellulitis present Compartment soft no sign of DVT  Assessment/Plan: 1 Day Post-Op Procedure(s) (LRB): EXCISION OF LEFT HIP TROCHANTERIC BURSA (Left) Advance diet Up with therapy D/C IV fluids  PWB (50%) LLE with walker for ambulation Discussed dressing instructions, D/C instructions Plan home today after PT this AM Follow up in 2 weeks for staple removal Will discuss with Dr. Mliss Fritz, [place] Rolls. 04/24/2014, 7:45 AM

## 2014-04-24 NOTE — Progress Notes (Signed)
I agree with Lowella Grip, RN assessment at 2100. Will continue to monitor.

## 2014-04-24 NOTE — Progress Notes (Signed)
Physical Therapy Treatment Patient Details Name: HAIDAN NHAN MRN: [id number] DOB: 1946/01/08 Today's Date: 04/24/2014    History of Present Illness      PT Comments    Pt progressing well.  Reviewed stairs and home therex with pt and spouse.  Pt states he has exercise programs at home from previous surgery.  Follow Up Recommendations  No PT follow up     Equipment Recommendations  None recommended by PT    Recommendations for Other Services OT consult     Precautions / Restrictions Precautions Precautions: Fall Restrictions Weight Bearing Restrictions: Yes LLE Weight Bearing: Partial weight bearing LLE Partial Weight Bearing Percentage or Pounds: 50%    Mobility  Bed Mobility Overal bed mobility: Modified Independent                Transfers Overall transfer level: Needs assistance Equipment used: Rolling walker (2 wheeled) Transfers: Sit to/from Stand Sit to Stand: Supervision         General transfer comment: cues for LE management and use of UEs to self assist  Ambulation/Gait Ambulation/Gait assistance: Supervision Ambulation Distance (Feet): 75 Feet Assistive device: Rolling walker (2 wheeled) Gait Pattern/deviations: Step-to pattern;Step-through pattern;Decreased step length - right;Decreased step length - left;Shuffle;Trunk flexed Gait velocity: decr   General Gait Details: cues for posture, sequence, position from RW and PWB on L   Stairs Stairs: Yes Stairs assistance: Min assist Stair Management: No rails;Step to pattern;Forwards;With crutches Number of Stairs: 4 General stair comments: min cues for sequence and crutch placement  Wheelchair Mobility    Modified Rankin (Stroke Patients Only)       Balance                                    Cognition Arousal/Alertness: Awake/alert Behavior During Therapy: WFL for tasks assessed/performed Overall Cognitive Status: Within Functional Limits for tasks assessed                       Exercises General Exercises - Lower Extremity Ankle Circles/Pumps: AROM;Both;10 reps;Supine Quad Sets: AROM;Both;10 reps;Supine Gluteal Sets: AROM;Both;10 reps;Supine Heel Slides: AAROM;Left;15 reps;Supine Hip ABduction/ADduction: AAROM;Left;10 reps;Supine Straight Leg Raises: AAROM;Left;10 reps;Supine    General Comments        Pertinent Vitals/Pain Pain Assessment: 0-10 Pain Score: 3  Pain Location: L hip Pain Descriptors / Indicators: Aching;Sore Pain Intervention(s): Limited activity within patient's tolerance;Monitored during session;Ice applied    Home Living                      Prior Function            PT Goals (current goals can now be found in the care plan section) Acute Rehab PT Goals Patient Stated Goal: Resume previous lifestyle with decreased pain PT Goal Formulation: With patient Time For Goal Achievement: 04/28/14 Potential to Achieve Goals: Good Progress towards PT goals: Progressing toward goals    Frequency  7X/week    PT Plan Discharge plan needs to be updated    Co-evaluation             End of Session Equipment Utilized During Treatment: Gait belt Activity Tolerance: Patient tolerated treatment well Patient left: in chair;with call bell/phone within reach;with family/visitor present     Time: 2353-6144 PT Time Calculation (min) (ACUTE ONLY): 28 min  Charges:  $Gait Training: 8-22 mins $Therapeutic Exercise: 8-22 mins  G Codes:      Jordayn Mink 2014/05/13, 10:11 AM

## 2014-04-24 NOTE — Clinical Social Work Note (Signed)
Referred to CSW for ?SNF. Chart reviewed and noted plans to d/c home with [facility] and DME. CSW to sign off- please contact us if SW needs arise. Eduard Clos, MSW, [place]

## 2014-04-24 NOTE — Discharge Summary (Signed)
Patient ID: Donald Carlson MRN: [id number] DOB/AGE: [birth year] 69 y.o.  Admit date: 04/23/2014 Discharge date: 04/24/2014  Admission Diagnoses:  Principal Problem:   Trochanteric bursitis of left hip Active Problems:   Bursitis, hip   Discharge Diagnoses:  Same  Past Medical History  Diagnosis Date  . Hypertension   . Diabetes mellitus   . Anemia   . Arthritis   . HERNIORRHAPHY, HX OF 01/07/2007    Qualifier: Diagnosis of  By: Ronnald Ramp CMA, Chemira    . Small bowel obstruction s/p open lysis of adhesions [id number] 08/23/2011  . Hyperlipidemia   . Kidney stones   . Skin cancer (melanoma) 1996    Left/ Back  . Bladder cancer 2012    scrapped bladder and inserted liquid chemo  . Pneumonia   . Gout     Surgeries: Procedure(s): EXCISION OF LEFT HIP TROCHANTERIC BURSA on 04/23/2014   Consultants:    Discharged Condition: Improved  Hospital Course: Donald Carlson is an 69 y.o. male who was admitted 04/23/2014 for operative treatment ofTrochanteric bursitis of left hip. Patient has severe unremitting pain that affects sleep, daily activities, and work/hobbies. After pre-op clearance the patient was taken to the operating room on 04/23/2014 and underwent  Procedure(s): EXCISION OF LEFT HIP TROCHANTERIC BURSA.    Patient was given perioperative antibiotics: Anti-infectives    Start     Dose/Rate Route Frequency Ordered Stop   04/23/14 1530  ceFAZolin (ANCEF) IVPB 2 g/50 mL premix     2 g100 mL/hr over 30 Minutes Intravenous Every 6 hours 04/23/14 1144 04/23/14 2231   04/23/14 0955  polymyxin B 500,000 Units, bacitracin 50,000 Units in sodium chloride irrigation 0.9 % 500 mL irrigation  Status:  Discontinued       As needed 04/23/14 0955 04/23/14 1020   04/23/14 0800  ceFAZolin (ANCEF) IVPB 2 g/50 mL premix     2 g100 mL/hr over 30 Minutes Intravenous On call to O.R. 04/23/14 0745 04/23/14 0930       Patient was given sequential compression devices, early ambulation, and  chemoprophylaxis to prevent DVT.  Patient benefited maximally from hospital stay and there were no complications.    Recent vital signs: Patient Vitals for the past 24 hrs:  BP Temp Temp src Pulse Resp SpO2  04/24/14 0602 130/78 mmHg 97.6 F (36.4 C) Oral 77 18 98 %  04/24/14 0337 - - - - 16 99 %  04/24/14 0200 (!) 160/77 mmHg 97.7 F (36.5 C) Oral 74 16 99 %  04/24/14 0000 - - - - 18 -  04/23/14 2154 (!) 143/79 mmHg 97.6 F (36.4 C) Oral 80 18 100 %     Recent laboratory studies:  Recent Labs  04/23/14 1217  WBC 10.3  HGB 13.0  HCT 39.6  PLT 198  CREATININE 0.90     Discharge Medications:     Medication List    STOP taking these medications        azithromycin 250 MG tablet  Commonly known as:  ZITHROMAX Z-PAK      TAKE these medications        amLODipine 5 MG tablet  Commonly known as:  NORVASC  Take 1 tablet (5 mg total) by mouth daily.     aspirin EC 81 MG tablet  Take 4 tablets (325 mg total) by mouth daily.     betaxolol 0.25 % ophthalmic suspension  Commonly known as:  BETOPTIC-S  Place 1 drop into both eyes 2 (two) times  daily.     CALCIUM PO  Take 1 tablet by mouth every morning.     carvedilol 25 MG tablet  Commonly known as:  COREG  Take 1 tablet (25 mg total) by mouth 2 (two) times daily with a meal.     docusate sodium 100 MG capsule  Commonly known as:  COLACE  Take 1 capsule (100 mg total) by mouth 2 (two) times daily.     FLAX SEED OIL PO  Take 1 tablet by mouth every morning.     glimepiride 2 MG tablet  Commonly known as:  AMARYL  Take 1 tablet (2 mg total) by mouth daily before breakfast.     glucose blood test strip  Commonly known as:  ONE TOUCH ULTRA TEST  USE TO TEST BLOOD SUGAR AS DIRECTED ICD E11.40     lisinopril 20 MG tablet  Commonly known as:  PRINIVIL,ZESTRIL  Take 1 tablet (20 mg total) by mouth 2 (two) times daily.     metFORMIN 500 MG tablet  Commonly known as:  GLUCOPHAGE  1 bid      oxyCODONE-acetaminophen 5-325 MG per tablet  Commonly known as:  ROXICET  Take 1-2 tablets by mouth every 4 (four) hours as needed for severe pain.     pioglitazone 15 MG tablet  Commonly known as:  ACTOS  Take 1 tablet (15 mg total) by mouth daily.     pravastatin 40 MG tablet  Commonly known as:  PRAVACHOL  Take 1 tablet (40 mg total) by mouth every evening.     SAW PALMETTO PO  Take 1 tablet by mouth every morning.     Vitamin D 1000 UNITS capsule  Take 1,000 Units by mouth every morning.        Diagnostic Studies: Dg Chest 2 View  04/20/2014   CLINICAL DATA:  Congestion.  Preop evaluation for hip surgery.  EXAM: CHEST  2 VIEW  COMPARISON:  06/18/2012  FINDINGS: Lungs are clear. Mild elevation of the right hemidiaphragm. Heart and mediastinum are within normal limits. The trachea is midline. No acute bone abnormality.  IMPRESSION: No active cardiopulmonary disease.   Electronically Signed   By: Markus Daft M.D.   On: 04/20/2014 15:02    Disposition: 01-Home or Self Care      Discharge Instructions    Call MD / Call 911    Complete by:  As directed   If you experience chest pain or shortness of breath, CALL 911 and be transported to the hospital emergency room.  If you develope a fever above 101 F, pus (white drainage) or increased drainage or redness at the wound, or calf pain, call your surgeon's office.     Constipation Prevention    Complete by:  As directed   Drink plenty of fluids.  Prune juice may be helpful.  You may use a stool softener, such as Colace (over the counter) 100 mg twice a day.  Use MiraLax (over the counter) for constipation as needed.     Diet - low sodium heart healthy    Complete by:  As directed      Increase activity slowly as tolerated    Complete by:  As directed            Follow-up Information    Follow up with BEANE,JEFFREY C, MD In 2 weeks.   Specialty:  Orthopedic Surgery   Why:  For suture removal   Contact information:   [address]  [address] [place] [address] [phone]        Signed: Cecilie Kicks. 04/24/2014, 4:56 PM

## 2014-04-29 NOTE — Progress Notes (Signed)
   04/24/14 0817  PT G-Codes **NOT FOR INPATIENT CLASS**  Functional Assessment Tool Used clinical judgement  Functional Limitation Mobility: Walking and moving around  Mobility: Walking and Moving Around Goal Status [phone]) CI  Mobility: Walking and Moving Around Discharge Status [phone]) CI  After chart review from Debe Coder, entered G-Codes according to clinical documentation for Discharge.  Clide Dales, PT Pager: [phone] 04/29/2014

## 2014-05-29 ENCOUNTER — Other Ambulatory Visit: Payer: Self-pay | Admitting: *Deleted

## 2014-05-29 DIAGNOSIS — E1149 Type 2 diabetes mellitus with other diabetic neurological complication: Secondary | ICD-10-CM

## 2014-05-29 DIAGNOSIS — E1165 Type 2 diabetes mellitus with hyperglycemia: Secondary | ICD-10-CM

## 2014-05-29 DIAGNOSIS — IMO0002 Reserved for concepts with insufficient information to code with codable children: Secondary | ICD-10-CM

## 2014-05-29 MED ORDER — GLIMEPIRIDE 2 MG PO TABS: 2.0000 mg | ORAL_TABLET | Freq: Every day | ORAL | Status: DC

## 2014-07-07 ENCOUNTER — Telehealth: Payer: Self-pay | Admitting: Internal Medicine

## 2014-07-07 DIAGNOSIS — I10 Essential (primary) hypertension: Secondary | ICD-10-CM

## 2014-07-07 MED ORDER — AMLODIPINE BESYLATE 5 MG PO TABS: 5.0000 mg | ORAL_TABLET | Freq: Every day | ORAL | Status: DC

## 2014-07-07 MED ORDER — CARVEDILOL 25 MG PO TABS: 25.0000 mg | ORAL_TABLET | Freq: Two times a day (BID) | ORAL | Status: DC

## 2014-07-07 NOTE — Telephone Encounter (Signed)
Patient needs refills for carvedilol (COREG) 25 MG tablet [[id number] and amLODipine (NORVASC) 5 MG tablet [[id number]. Pharmacy is [facility] on [address]. No longer using mail order.

## 2014-07-07 NOTE — Telephone Encounter (Signed)
Done

## 2014-07-08 ENCOUNTER — Other Ambulatory Visit (INDEPENDENT_AMBULATORY_CARE_PROVIDER_SITE_OTHER): Payer: Medicare Other

## 2014-07-08 DIAGNOSIS — IMO0002 Reserved for concepts with insufficient information to code with codable children: Secondary | ICD-10-CM

## 2014-07-08 DIAGNOSIS — E1165 Type 2 diabetes mellitus with hyperglycemia: Secondary | ICD-10-CM | POA: Diagnosis not present

## 2014-07-08 LAB — HEMOGLOBIN A1C: Hgb A1c MFr Bld: 6.3 % (ref 4.6–6.5)

## 2014-07-13 ENCOUNTER — Telehealth: Payer: Self-pay

## 2014-07-13 NOTE — Telephone Encounter (Signed)
Patient called to educate on Medicare Wellness apt. LVM for the patient to call back to educate and schedule for wellness visit.   

## 2014-07-21 ENCOUNTER — Other Ambulatory Visit: Payer: Self-pay | Admitting: Internal Medicine

## 2014-07-21 ENCOUNTER — Encounter: Payer: Self-pay | Admitting: Internal Medicine

## 2014-07-22 ENCOUNTER — Other Ambulatory Visit: Payer: Self-pay

## 2014-07-22 ENCOUNTER — Other Ambulatory Visit: Payer: Self-pay | Admitting: Internal Medicine

## 2014-07-22 MED ORDER — PRAVASTATIN SODIUM 40 MG PO TABS: 40.0000 mg | ORAL_TABLET | Freq: Every evening | ORAL | Status: DC

## 2014-09-14 ENCOUNTER — Other Ambulatory Visit: Payer: Self-pay

## 2014-09-19 ENCOUNTER — Other Ambulatory Visit: Payer: Self-pay | Admitting: Internal Medicine

## 2014-09-22 ENCOUNTER — Other Ambulatory Visit: Payer: Self-pay

## 2014-09-22 MED ORDER — LISINOPRIL 20 MG PO TABS: 20.0000 mg | ORAL_TABLET | Freq: Two times a day (BID) | ORAL | Status: DC

## 2014-09-23 ENCOUNTER — Telehealth: Payer: Self-pay | Admitting: Emergency Medicine

## 2014-09-23 NOTE — Telephone Encounter (Signed)
PA request started for Lisinopril

## 2014-10-01 ENCOUNTER — Other Ambulatory Visit: Payer: Self-pay | Admitting: Internal Medicine

## 2014-10-02 ENCOUNTER — Other Ambulatory Visit: Payer: Self-pay | Admitting: Internal Medicine

## 2014-10-23 ENCOUNTER — Other Ambulatory Visit: Payer: Self-pay | Admitting: Internal Medicine

## 2014-10-26 ENCOUNTER — Other Ambulatory Visit: Payer: Self-pay | Admitting: Internal Medicine

## 2014-10-26 ENCOUNTER — Telehealth: Payer: Self-pay | Admitting: Internal Medicine

## 2014-10-26 DIAGNOSIS — E1165 Type 2 diabetes mellitus with hyperglycemia: Principal | ICD-10-CM

## 2014-10-26 DIAGNOSIS — E1140 Type 2 diabetes mellitus with diabetic neuropathy, unspecified: Secondary | ICD-10-CM

## 2014-10-26 DIAGNOSIS — IMO0002 Reserved for concepts with insufficient information to code with codable children: Secondary | ICD-10-CM

## 2014-10-26 NOTE — Telephone Encounter (Signed)
done

## 2014-10-26 NOTE — Telephone Encounter (Signed)
States Dr. Linna Darner told him to come back in in August for labs.  Is requesting those labs to be entered.

## 2014-10-26 NOTE — Telephone Encounter (Signed)
Please advise on which labs should be entered.

## 2014-10-27 NOTE — Telephone Encounter (Signed)
Spoke with pts wife and informed her that the labs were in.

## 2014-11-04 ENCOUNTER — Other Ambulatory Visit: Payer: Self-pay | Admitting: Internal Medicine

## 2014-11-05 ENCOUNTER — Other Ambulatory Visit (INDEPENDENT_AMBULATORY_CARE_PROVIDER_SITE_OTHER): Payer: Medicare Other

## 2014-11-05 DIAGNOSIS — E1165 Type 2 diabetes mellitus with hyperglycemia: Secondary | ICD-10-CM | POA: Diagnosis not present

## 2014-11-05 DIAGNOSIS — IMO0002 Reserved for concepts with insufficient information to code with codable children: Secondary | ICD-10-CM

## 2014-11-05 DIAGNOSIS — E1140 Type 2 diabetes mellitus with diabetic neuropathy, unspecified: Secondary | ICD-10-CM

## 2014-11-05 LAB — MICROALBUMIN / CREATININE URINE RATIO
Creatinine,U: 138.1 mg/dL
Microalb Creat Ratio: 0.5 mg/g (ref 0.0–30.0)
Microalb, Ur: <0.7 mg/dL (ref 0.0–1.9)

## 2014-11-05 LAB — HEMOGLOBIN A1C: Hgb A1c MFr Bld: 6 % (ref 4.6–6.5)

## 2014-11-10 ENCOUNTER — Other Ambulatory Visit: Payer: Self-pay | Admitting: Internal Medicine

## 2014-11-11 ENCOUNTER — Other Ambulatory Visit: Payer: Self-pay | Admitting: Emergency Medicine

## 2014-11-11 MED ORDER — GLUCOSE BLOOD VI STRP: 1.0000 | ORAL_STRIP | Freq: Two times a day (BID) | Status: DC

## 2014-11-19 ENCOUNTER — Telehealth: Payer: Self-pay | Admitting: *Deleted

## 2014-11-19 MED ORDER — GLUCOSE BLOOD VI STRP: 1.0000 | ORAL_STRIP | Freq: Two times a day (BID) | Status: DC

## 2014-11-19 NOTE — Telephone Encounter (Signed)
Receive call wife states due to insurance have to use [facility] pharmacy, and they are needing a rx for testing strips. Verified type of meter pt is using inform send one touch ultra to pharmacy...Donald Carlson

## 2014-11-22 ENCOUNTER — Other Ambulatory Visit: Payer: Self-pay | Admitting: Internal Medicine

## 2014-11-25 ENCOUNTER — Other Ambulatory Visit: Payer: Self-pay | Admitting: Emergency Medicine

## 2014-11-25 DIAGNOSIS — E1149 Type 2 diabetes mellitus with other diabetic neurological complication: Secondary | ICD-10-CM

## 2014-11-25 DIAGNOSIS — IMO0002 Reserved for concepts with insufficient information to code with codable children: Secondary | ICD-10-CM

## 2014-11-25 DIAGNOSIS — E1165 Type 2 diabetes mellitus with hyperglycemia: Secondary | ICD-10-CM

## 2014-11-25 MED ORDER — GLIMEPIRIDE 2 MG PO TABS: 2.0000 mg | ORAL_TABLET | Freq: Every day | ORAL | Status: DC

## 2014-11-27 ENCOUNTER — Encounter: Payer: Self-pay | Admitting: Internal Medicine

## 2014-11-27 ENCOUNTER — Ambulatory Visit (INDEPENDENT_AMBULATORY_CARE_PROVIDER_SITE_OTHER): Payer: Medicare Other | Admitting: Internal Medicine

## 2014-11-27 VITALS — BP 140/78 | HR 55 | Temp 98.5°F | Ht 70.0 in | Wt 210.0 lb

## 2014-11-27 DIAGNOSIS — E1165 Type 2 diabetes mellitus with hyperglycemia: Secondary | ICD-10-CM | POA: Diagnosis not present

## 2014-11-27 DIAGNOSIS — I10 Essential (primary) hypertension: Secondary | ICD-10-CM | POA: Diagnosis not present

## 2014-11-27 DIAGNOSIS — E1140 Type 2 diabetes mellitus with diabetic neuropathy, unspecified: Secondary | ICD-10-CM | POA: Diagnosis not present

## 2014-11-27 DIAGNOSIS — E782 Mixed hyperlipidemia: Secondary | ICD-10-CM | POA: Diagnosis not present

## 2014-11-27 DIAGNOSIS — IMO0002 Reserved for concepts with insufficient information to code with codable children: Secondary | ICD-10-CM

## 2014-11-27 NOTE — Assessment & Plan Note (Signed)
A1c , urine microalbumin, BMET in 4 mos Nutrition discussed

## 2014-11-27 NOTE — Patient Instructions (Signed)
  The following nutritional changes may help prevent Diabetes progression & complications.  White carbohydrates (potatoes, rice, bread, and pasta) cause a high spike of the sugar level which stays elevated for a significant period of time (called sugar"load").  For example a  baked potato has a cup of sugar and a  french fry  2 teaspoons of sugar.  More complex carbs such as yams, wild  rice, whole grained bread &  wheat pasta have been much lower spike and persistent load of sugar than the white carbs. The pancreas excretes excess insulin in response to the high spike & load of sugar . Over time the pancreas can actually run out of insulin necessitating insulin shots.   Minimal Blood Pressure Goal= AVERAGE < 140/90;  Ideal is an AVERAGE < 135/85. This AVERAGE should be calculated from @ least 5-7 BP readings taken @ different times of day on different days of week. You should not respond to isolated BP readings , but rather the AVERAGE for that week .Please bring your  blood pressure cuff to office visits to verify that it is reliable.It  can also be checked against the blood pressure device at the pharmacy. Finger or wrist cuffs are not dependable; an arm cuff is.

## 2014-11-27 NOTE — Progress Notes (Signed)
   Subjective:    Patient ID: Donald Carlson, male    DOB: May 06, 1945, 69 y.o.   MRN: [id number]  HPI The patient is here to assess status of active health conditions.  PMH, FH, & Social History reviewed & updated.No change in [place] as recorded.  He is on a modified heart healthy diet and does restrict salt. He's not been exercising. He does eat some sweets. He has been compliant with his medicines without adverse effects.  Blood pressure averages 136/70.  His most recent A1c was 6% on 11/05/14. A1c was 8.8% in January of this year. He is on glimepiride 2 mg daily, pioglitazone and metformin twice a day. He denies any hypoglycemia. Fasting blood sugars average 114 over the last month. Ophthalmologic exam is up-to-date. He's not seen a podiatrist.  He describes burning in the left foot ventrally by the end of the day. He also has some edema on that side. He has had back surgery twice but the symptoms seem to have been worse since he had total left hip.  He also describes some blurred vision late in the day.  He has no other cardiovascular or neurologic symptoms.  Review of Systems  Chest pain, palpitations, tachycardia, exertional dyspnea, paroxysmal nocturnal dyspnea, or claudication are absent. No unexplained weight loss, abdominal pain, significant dyspepsia, dysphagia, melena, rectal bleeding, or persistently small caliber stools. Dysuria, pyuria, hematuria, frequency, or nocturia  are denied. Change in hair, skin, nails denied. No bowel changes of constipation or diarrhea. No intolerance to heat or cold. He denies polyuria, polydipsia, polyphasia. He has no nonhealing skin lesions.     Objective:   Physical Exam  Pertinent or positive findings include: He has complete dentures. The nasal septum is deviated to the right. Abdomen is protuberant. There is medial flexion change at the DIP joint of the fifth finger bilaterally. He has decreased sensation of the right foot at the base of  the fifth toe only.  General appearance :adequately nourished; in no distress.  Eyes: No conjunctival inflammation or scleral icterus is present.  Oral exam:  Lips and gums are healthy appearing.There is no oropharyngeal erythema or exudate noted.   Heart:  Normal rate and regular rhythm. S1 and S2 normal without gallop, murmur, click, rub or other extra sounds    Lungs:Chest clear to auscultation; no wheezes, rhonchi,rales ,or rubs present.No increased work of breathing.   Abdomen: bowel sounds normal, soft and non-tender without masses, organomegaly or hernias noted.  No guarding or rebound.  Vascular : all pulses equal ; no bruits present.  Skin:Warm & dry.  Intact without suspicious lesions or rashes ; no tenting or jaundice   Lymphatic: No lymphadenopathy is noted about the head, neck, axilla.   Neuro: Strength, tone & DTRs normal.     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 He'll have repeat A1c in 4 months. Also at that time lipids would be due.

## 2014-11-27 NOTE — Assessment & Plan Note (Signed)
Blood pressure goals reviewed. BMET in 4 mos

## 2014-11-27 NOTE — Progress Notes (Signed)
Pre visit review using our clinic review tool, if applicable. No additional management support is needed unless otherwise documented below in the visit note. 

## 2014-11-27 NOTE — Assessment & Plan Note (Signed)
Lipids, LFTs, TSH in 4 mos

## 2014-11-30 ENCOUNTER — Other Ambulatory Visit: Payer: Self-pay | Admitting: Internal Medicine

## 2014-12-11 LAB — HM DIABETES EYE EXAM

## 2014-12-23 ENCOUNTER — Encounter: Payer: Self-pay | Admitting: Internal Medicine

## 2015-01-03 ENCOUNTER — Other Ambulatory Visit: Payer: Self-pay | Admitting: Internal Medicine

## 2015-01-04 ENCOUNTER — Other Ambulatory Visit: Payer: Self-pay | Admitting: Emergency Medicine

## 2015-01-04 DIAGNOSIS — I10 Essential (primary) hypertension: Secondary | ICD-10-CM

## 2015-01-04 MED ORDER — CARVEDILOL 25 MG PO TABS: 25.0000 mg | ORAL_TABLET | Freq: Two times a day (BID) | ORAL | Status: DC

## 2015-01-04 MED ORDER — AMLODIPINE BESYLATE 5 MG PO TABS: 5.0000 mg | ORAL_TABLET | Freq: Every day | ORAL | Status: DC

## 2015-01-19 ENCOUNTER — Other Ambulatory Visit: Payer: Self-pay | Admitting: Internal Medicine

## 2015-01-20 ENCOUNTER — Other Ambulatory Visit: Payer: Self-pay | Admitting: Emergency Medicine

## 2015-01-20 MED ORDER — LISINOPRIL 20 MG PO TABS: 20.0000 mg | ORAL_TABLET | Freq: Two times a day (BID) | ORAL | Status: DC

## 2015-01-21 ENCOUNTER — Other Ambulatory Visit: Payer: Self-pay | Admitting: Internal Medicine

## 2015-02-01 ENCOUNTER — Encounter: Payer: Self-pay | Admitting: Internal Medicine

## 2015-02-03 ENCOUNTER — Telehealth: Payer: Self-pay

## 2015-02-03 NOTE — Telephone Encounter (Signed)
Patient called to educate on Medicare Wellness apt. LVM for the patient to call back to educate and schedule for wellness visit at the practice number or my direct number for questions

## 2015-02-06 ENCOUNTER — Encounter: Payer: Self-pay | Admitting: Internal Medicine

## 2015-02-08 ENCOUNTER — Other Ambulatory Visit: Payer: Self-pay

## 2015-02-08 DIAGNOSIS — I10 Essential (primary) hypertension: Secondary | ICD-10-CM

## 2015-02-08 MED ORDER — PIOGLITAZONE HCL 15 MG PO TABS: 15.0000 mg | ORAL_TABLET | Freq: Every day | ORAL | Status: DC

## 2015-02-08 MED ORDER — AMLODIPINE BESYLATE 5 MG PO TABS: 5.0000 mg | ORAL_TABLET | Freq: Every day | ORAL | Status: DC

## 2015-02-08 MED ORDER — METFORMIN HCL 500 MG PO TABS: 500.0000 mg | ORAL_TABLET | Freq: Two times a day (BID) | ORAL | Status: DC

## 2015-02-08 MED ORDER — CARVEDILOL 25 MG PO TABS: 25.0000 mg | ORAL_TABLET | Freq: Two times a day (BID) | ORAL | Status: DC

## 2015-02-08 MED ORDER — LISINOPRIL 20 MG PO TABS: 20.0000 mg | ORAL_TABLET | Freq: Two times a day (BID) | ORAL | Status: DC

## 2015-02-08 NOTE — Telephone Encounter (Signed)
Call to the patient regarding AWV and is working until 4pm. Will attempt call back later to schedule awv

## 2015-02-15 ENCOUNTER — Telehealth: Payer: Self-pay

## 2015-02-15 NOTE — Telephone Encounter (Signed)
Patient called to educate on Medicare Wellness apt. LVM for the patient to call back to educate and schedule for wellness visit.   

## 2015-02-19 NOTE — Telephone Encounter (Signed)
2nd outreach for AWV; LVM for return call to discuss

## 2015-03-29 ENCOUNTER — Other Ambulatory Visit: Payer: Self-pay | Admitting: Internal Medicine

## 2015-04-07 ENCOUNTER — Other Ambulatory Visit (INDEPENDENT_AMBULATORY_CARE_PROVIDER_SITE_OTHER): Payer: Medicare Other

## 2015-04-07 DIAGNOSIS — E1140 Type 2 diabetes mellitus with diabetic neuropathy, unspecified: Secondary | ICD-10-CM

## 2015-04-07 DIAGNOSIS — E1165 Type 2 diabetes mellitus with hyperglycemia: Secondary | ICD-10-CM

## 2015-04-07 DIAGNOSIS — E782 Mixed hyperlipidemia: Secondary | ICD-10-CM | POA: Diagnosis not present

## 2015-04-07 DIAGNOSIS — I10 Essential (primary) hypertension: Secondary | ICD-10-CM | POA: Diagnosis not present

## 2015-04-07 DIAGNOSIS — IMO0002 Reserved for concepts with insufficient information to code with codable children: Secondary | ICD-10-CM

## 2015-04-07 LAB — BASIC METABOLIC PANEL
BUN: 20 mg/dL (ref 6–23)
CO2: 28 mEq/L (ref 19–32)
Calcium: 9.4 mg/dL (ref 8.4–10.5)
Chloride: 103 mEq/L (ref 96–112)
Creatinine, Ser: 1.18 mg/dL (ref 0.40–1.50)
GFR: 64.83 mL/min (ref >60.00)
Glucose, Bld: 116 mg/dL — ABNORMAL HIGH (ref 70–99)
Potassium: 4.1 mEq/L (ref 3.5–5.1)
Sodium: 138 mEq/L (ref 135–145)

## 2015-04-07 LAB — MICROALBUMIN / CREATININE URINE RATIO
Creatinine,U: 177.3 mg/dL
Microalb Creat Ratio: 0.4 mg/g (ref 0.0–30.0)
Microalb, Ur: 0.7 mg/dL (ref 0.0–1.9)

## 2015-04-07 LAB — LIPID PANEL
Cholesterol: 152 mg/dL (ref 0–200)
HDL: 36.9 mg/dL — ABNORMAL LOW (ref >39.00)
LDL Cholesterol: 94 mg/dL (ref 0–99)
NonHDL: 115.46
Total CHOL/HDL Ratio: 4
Triglycerides: 107 mg/dL (ref 0.0–149.0)
VLDL: 21.4 mg/dL (ref 0.0–40.0)

## 2015-04-07 LAB — TSH: TSH: 1.17 u[IU]/mL (ref 0.35–4.50)

## 2015-04-07 LAB — HEPATIC FUNCTION PANEL
ALT: 16 U/L (ref 0–53)
AST: 20 U/L (ref 0–37)
Albumin: 4.2 g/dL (ref 3.5–5.2)
Alkaline Phosphatase: 60 U/L (ref 39–117)
Bilirubin, Direct: 0.1 mg/dL (ref 0.0–0.3)
Total Bilirubin: 0.4 mg/dL (ref 0.2–1.2)
Total Protein: 6.9 g/dL (ref 6.0–8.3)

## 2015-04-07 LAB — HEMOGLOBIN A1C: Hgb A1c MFr Bld: 6.5 % (ref 4.6–6.5)

## 2015-04-18 ENCOUNTER — Other Ambulatory Visit: Payer: Self-pay | Admitting: Internal Medicine

## 2015-04-25 ENCOUNTER — Encounter (HOSPITAL_COMMUNITY): Payer: Self-pay | Admitting: *Deleted

## 2015-04-25 ENCOUNTER — Emergency Department (HOSPITAL_COMMUNITY)
Admission: EM | Admit: 2015-04-25 | Discharge: 2015-04-25 | Disposition: A | Discharge status: Home / Self Care | Payer: Medicare Other | Attending: Emergency Medicine | Admitting: Emergency Medicine

## 2015-04-25 ENCOUNTER — Emergency Department (HOSPITAL_COMMUNITY): Payer: Medicare Other

## 2015-04-25 DIAGNOSIS — Z85828 Personal history of other malignant neoplasm of skin: Secondary | ICD-10-CM | POA: Insufficient documentation

## 2015-04-25 DIAGNOSIS — Z8551 Personal history of malignant neoplasm of bladder: Secondary | ICD-10-CM | POA: Insufficient documentation

## 2015-04-25 DIAGNOSIS — Z862 Personal history of diseases of the blood and blood-forming organs and certain disorders involving the immune mechanism: Secondary | ICD-10-CM | POA: Insufficient documentation

## 2015-04-25 DIAGNOSIS — Z7984 Long term (current) use of oral hypoglycemic drugs: Secondary | ICD-10-CM | POA: Diagnosis not present

## 2015-04-25 DIAGNOSIS — Z8701 Personal history of pneumonia (recurrent): Secondary | ICD-10-CM | POA: Insufficient documentation

## 2015-04-25 DIAGNOSIS — Z87442 Personal history of urinary calculi: Secondary | ICD-10-CM | POA: Insufficient documentation

## 2015-04-25 DIAGNOSIS — E119 Type 2 diabetes mellitus without complications: Secondary | ICD-10-CM | POA: Diagnosis not present

## 2015-04-25 DIAGNOSIS — Z8719 Personal history of other diseases of the digestive system: Secondary | ICD-10-CM | POA: Diagnosis not present

## 2015-04-25 DIAGNOSIS — Z7982 Long term (current) use of aspirin: Secondary | ICD-10-CM | POA: Diagnosis not present

## 2015-04-25 DIAGNOSIS — S8992XA Unspecified injury of left lower leg, initial encounter: Secondary | ICD-10-CM | POA: Diagnosis present

## 2015-04-25 DIAGNOSIS — Y9389 Activity, other specified: Secondary | ICD-10-CM | POA: Diagnosis not present

## 2015-04-25 DIAGNOSIS — S82292A Other fracture of shaft of left tibia, initial encounter for closed fracture: Secondary | ICD-10-CM | POA: Insufficient documentation

## 2015-04-25 DIAGNOSIS — Y9289 Other specified places as the place of occurrence of the external cause: Secondary | ICD-10-CM | POA: Insufficient documentation

## 2015-04-25 DIAGNOSIS — I10 Essential (primary) hypertension: Secondary | ICD-10-CM | POA: Diagnosis not present

## 2015-04-25 DIAGNOSIS — E785 Hyperlipidemia, unspecified: Secondary | ICD-10-CM | POA: Diagnosis not present

## 2015-04-25 DIAGNOSIS — Y998 Other external cause status: Secondary | ICD-10-CM | POA: Insufficient documentation

## 2015-04-25 DIAGNOSIS — M1990 Unspecified osteoarthritis, unspecified site: Secondary | ICD-10-CM | POA: Diagnosis not present

## 2015-04-25 DIAGNOSIS — S82202A Unspecified fracture of shaft of left tibia, initial encounter for closed fracture: Secondary | ICD-10-CM

## 2015-04-25 DIAGNOSIS — Z79899 Other long term (current) drug therapy: Secondary | ICD-10-CM | POA: Insufficient documentation

## 2015-04-25 DIAGNOSIS — W1839XA Other fall on same level, initial encounter: Secondary | ICD-10-CM | POA: Insufficient documentation

## 2015-04-25 MED ORDER — OXYCODONE-ACETAMINOPHEN 5-325 MG PO TABS: 1.0000 | ORAL_TABLET | Freq: Four times a day (QID) | ORAL | Status: DC | PRN

## 2015-04-25 MED ORDER — OXYCODONE-ACETAMINOPHEN 5-325 MG PO TABS
1.0000 | ORAL_TABLET | Freq: Once | ORAL | Status: AC
  Administered 2015-04-25: 1 via ORAL
  Filled 2015-04-25: qty 1

## 2015-04-25 NOTE — ED Provider Notes (Signed)
CSN: [id number]     Arrival date & time 04/25/15  1533 History   First MD Initiated Contact with Patient 04/25/15 1651     Chief Complaint  Patient presents with  . Leg Injury     (Consider location/radiation/quality/duration/timing/severity/associated sxs/prior Treatment) HPI Comments: Patient states today he was unloading leaves out of bags from his truck and he threw a bucket up close to the house. The bucket started rolling down the hill and he kicked it with his right foot causing him to lose his balance and fall onto his left leg. He felt a pop in his left leg and was not able to bear weight. He denies head injury or LOC. No hip pain. Normal sensation  The history is provided by the patient.    Past Medical History  Diagnosis Date  . Hypertension   . Diabetes mellitus   . Anemia   . Arthritis   . HERNIORRHAPHY, HX OF 01/07/2007    Qualifier: Diagnosis of  By: Ronnald Ramp CMA, Chemira    . Small bowel obstruction s/p open lysis of adhesions [id number] 08/23/2011  . Hyperlipidemia   . Kidney stones   . Skin cancer (melanoma) ([place]) 1996    Left/ Back  . Bladder cancer ([place]) 2012    scrapped bladder and inserted liquid chemo  . Pneumonia   . Gout    Past Surgical History  Procedure Laterality Date  . 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 Hernia repair    . Knee arthroscopy      Left knee  . Colonoscopy  07/2002    neg  . Lumbar laminectomy  2004  . Shoulder surgery  05/2008  . Laparotomy  08/23/2011    Procedure: EXPLORATORY LAPAROTOMY;  Surgeon: Edward Jolly, MD;  Location: [facility];  Service: General;  Laterality: N/A;  Lysis of Adhesions/small bowel obstruction  . Abdominal adhesion surgery  08/23/2011    Procedure: EXPLORATORY LAPAROTOMY;  Surgeon: Edward Jolly, MD;  Location: [facility];  Service: General;  Laterality: N/A;  Lysis of Adhesions/small bowel obstruction  . Hip pinning,cannulated Left 06/19/2012    Procedure: CANNULATED HIP PINNING;  Surgeon:  Johnn Hai, MD;  Location: [facility];  Service: Orthopedics;  Laterality: Left;  . Fracture surgery    . Total hip arthroplasty Left 01/30/2013    Procedure: REMOVAL OF HARDWARE, LEFT HIP HEMIARTHROPLASTY;  Surgeon: Johnn Hai, MD;  Location: [facility];  Service: Orthopedics;  Laterality: Left;  . Excision/release bursa hip Left 04/23/2014    Procedure: EXCISION OF LEFT HIP TROCHANTERIC BURSA;  Surgeon: Johnn Hai, MD;  Location: [facility];  Service: Orthopedics;  Laterality: Left;   Family History  Problem Relation Age of Onset  . Diabetes Mother   . Heart attack Mother 58  . Heart disease Mother   . Heart attack Brother 33  . Lung cancer Brother   . Emphysema Brother   . Cancer Brother     lung  . Cancer Brother     Lymph node/neck  . Breast cancer Sister   . Cancer Sister     breast   Social History  Substance Use Topics  . Smoking status: Never Smoker   . Smokeless tobacco: Never Used  . Alcohol Use: No    Review of Systems  All other systems reviewed and are negative.     Allergies  Review of patient's allergies indicates no known allergies.  Home Medications   Prior to Admission  medications   Medication Sig Start Date End Date Taking? Authorizing Provider  amLODipine (NORVASC) 5 MG tablet Take 1 tablet (5 mg total) by mouth daily. 02/08/15  Yes Hendricks Limes, MD  aspirin EC 81 MG tablet Take 4 tablets (325 mg total) by mouth daily. Patient taking differently: Take 81 mg by mouth daily.  04/24/14  Yes Cecilie Kicks, PA-C  betaxolol (BETOPTIC-S) 0.25 % ophthalmic suspension Place 1 drop into both eyes 2 (two) times daily.    Yes Historical Provider, MD  CALCIUM-VITAMIN D PO Take 1 tablet by mouth daily.   Yes Historical Provider, MD  carvedilol (COREG) 25 MG tablet Take 1 tablet (25 mg total) by mouth 2 (two) times daily with a meal. 02/08/15  Yes Hendricks Limes, MD  Flaxseed, Linseed, (FLAX SEED OIL PO) Take 1 tablet by mouth every morning.   Yes  Historical Provider, MD  lisinopril (PRINIVIL,ZESTRIL) 20 MG tablet Take 1 tablet (20 mg total) by mouth 2 (two) times daily. 02/08/15  Yes Hendricks Limes, MD  metFORMIN (GLUCOPHAGE) 500 MG tablet Take 1 tablet (500 mg total) by mouth 2 (two) times daily. 02/08/15  Yes Hendricks Limes, MD  pioglitazone (ACTOS) 15 MG tablet Take 1 tablet (15 mg total) by mouth daily. 02/08/15  Yes Hendricks Limes, MD  pravastatin (PRAVACHOL) 40 MG tablet Take 1 tablet (40 mg total) by mouth daily. Must keep new appt w/new provider for future refills 04/19/15  Yes Binnie Rail, MD  Saw Palmetto, Serenoa repens, (SAW PALMETTO PO) Take 1 tablet by mouth every morning.   Yes Historical Provider, MD  glucose blood (ONE TOUCH ULTRA TEST) test strip 1 each by Other route 2 (two) times daily. USE TO TEST BLOOD SUGAR AS DIRECTED ICD E11.40 11/11/14   Hendricks Limes, MD  glucose blood (ONE TOUCH ULTRA TEST) test strip 1 each by Other route 2 (two) times daily. Use to check blood sugars twice a day Dx e11.9 11/19/14   Hendricks Limes, MD  metFORMIN (GLUCOPHAGE) 500 MG tablet Take 1 tablet (500 mg total) by mouth 2 (two) times daily. --- Please establish with new PCP for further refills Patient not taking: Reported on 04/25/2015 03/29/15   Hendricks Limes, MD  oxyCODONE-acetaminophen (PERCOCET/ROXICET) 5-325 MG tablet Take 1-2 tablets by mouth every 6 (six) hours as needed for severe pain. 04/25/15   Blanchie Dessert, MD   BP 176/92 mmHg  Pulse 65  Resp 16  SpO2 97% Physical Exam  Constitutional: He is oriented to person, place, and time. He appears well-developed and well-nourished. No distress.  HENT:  Head: Normocephalic and atraumatic.  Mouth/Throat: Oropharynx is clear and moist.  Eyes: Conjunctivae and EOM are normal. Pupils are equal, round, and reactive to light.  Neck: Normal range of motion. Neck supple.  Cardiovascular: Normal rate and intact distal pulses.   Pulmonary/Chest: Effort normal.  Musculoskeletal:  Normal range of motion. He exhibits tenderness. He exhibits no edema.       Left lower leg: He exhibits tenderness and swelling.       Legs: Neurological: He is alert and oriented to person, place, and time.  Skin: Skin is warm and dry. No rash noted. No erythema.  Psychiatric: He has a normal mood and affect. His behavior is normal.  Nursing note and vitals reviewed.   ED Course  Procedures (including critical care time) Labs Review Labs Reviewed - No data to display  Imaging Review Dg Tibia/fibula Left  04/25/2015  CLINICAL DATA:  Fall EXAM: LEFT TIBIA AND FIBULA - 2 VIEW COMPARISON:  None. FINDINGS: Nondisplaced fracture mid tibia. No fracture of the fibula. Ankle and knee joints appear negative. IMPRESSION: Nondisplaced fracture mid tibia Electronically Signed   By: Franchot Gallo M.D.   On: 04/25/2015 17:25   I have personally reviewed and evaluated these images and lab results as part of my medical decision-making.   EKG Interpretation None      MDM   Final diagnoses:  Tibia fracture, left, closed, initial encounter    Patient with a mechanical fall today and evidence of a nondisplaced fracture of the mid tibia. Patient placed in short-leg splint placed on crutches and given follow-up with Dr. Tonita Cong his orthopedist. He denies any head injury, hip pain or LOC. Patient given pain control and was discharged home.    Blanchie Dessert, MD 04/26/15 0001

## 2015-04-25 NOTE — ED Notes (Signed)
Pt reports fall when he was kicking a bucket with his L leg.  Landing on his leg.  No weight bearing to L leg.  Swelling noted.

## 2015-04-26 DIAGNOSIS — S82235A Nondisplaced oblique fracture of shaft of left tibia, initial encounter for closed fracture: Secondary | ICD-10-CM | POA: Diagnosis not present

## 2015-04-30 DIAGNOSIS — S8992XD Unspecified injury of left lower leg, subsequent encounter: Secondary | ICD-10-CM | POA: Diagnosis not present

## 2015-04-30 DIAGNOSIS — S82235D Nondisplaced oblique fracture of shaft of left tibia, subsequent encounter for closed fracture with routine healing: Secondary | ICD-10-CM | POA: Diagnosis not present

## 2015-05-07 DIAGNOSIS — S82235D Nondisplaced oblique fracture of shaft of left tibia, subsequent encounter for closed fracture with routine healing: Secondary | ICD-10-CM | POA: Diagnosis not present

## 2015-05-21 DIAGNOSIS — S82235D Nondisplaced oblique fracture of shaft of left tibia, subsequent encounter for closed fracture with routine healing: Secondary | ICD-10-CM | POA: Diagnosis not present

## 2015-06-04 DIAGNOSIS — S82235D Nondisplaced oblique fracture of shaft of left tibia, subsequent encounter for closed fracture with routine healing: Secondary | ICD-10-CM | POA: Diagnosis not present

## 2015-06-04 DIAGNOSIS — H401231 Low-tension glaucoma, bilateral, mild stage: Secondary | ICD-10-CM | POA: Diagnosis not present

## 2015-06-16 ENCOUNTER — Ambulatory Visit (INDEPENDENT_AMBULATORY_CARE_PROVIDER_SITE_OTHER): Payer: Medicare Other | Admitting: Internal Medicine

## 2015-06-16 ENCOUNTER — Encounter: Payer: Self-pay | Admitting: Internal Medicine

## 2015-06-16 VITALS — BP 134/82 | HR 62 | Temp 97.8°F | Resp 16 | Wt 202.0 lb

## 2015-06-16 DIAGNOSIS — I10 Essential (primary) hypertension: Secondary | ICD-10-CM | POA: Diagnosis not present

## 2015-06-16 DIAGNOSIS — E782 Mixed hyperlipidemia: Secondary | ICD-10-CM | POA: Diagnosis not present

## 2015-06-16 DIAGNOSIS — E1140 Type 2 diabetes mellitus with diabetic neuropathy, unspecified: Secondary | ICD-10-CM

## 2015-06-16 DIAGNOSIS — IMO0002 Reserved for concepts with insufficient information to code with codable children: Secondary | ICD-10-CM

## 2015-06-16 DIAGNOSIS — E1165 Type 2 diabetes mellitus with hyperglycemia: Secondary | ICD-10-CM

## 2015-06-16 DIAGNOSIS — M79605 Pain in left leg: Secondary | ICD-10-CM | POA: Diagnosis not present

## 2015-06-16 MED ORDER — PIOGLITAZONE HCL 15 MG PO TABS: 15.0000 mg | ORAL_TABLET | Freq: Every day | ORAL | Status: DC

## 2015-06-16 MED ORDER — ASPIRIN EC 81 MG PO TBEC: 81.0000 mg | DELAYED_RELEASE_TABLET | Freq: Every day | ORAL | Status: DC

## 2015-06-16 MED ORDER — PRAVASTATIN SODIUM 40 MG PO TABS: 40.0000 mg | ORAL_TABLET | Freq: Every day | ORAL | Status: DC

## 2015-06-16 MED ORDER — LISINOPRIL 20 MG PO TABS: 20.0000 mg | ORAL_TABLET | Freq: Two times a day (BID) | ORAL | Status: DC

## 2015-06-16 MED ORDER — METFORMIN HCL 500 MG PO TABS: 500.0000 mg | ORAL_TABLET | Freq: Two times a day (BID) | ORAL | Status: DC

## 2015-06-16 MED ORDER — AMLODIPINE BESYLATE 5 MG PO TABS: 5.0000 mg | ORAL_TABLET | Freq: Every day | ORAL | Status: DC

## 2015-06-16 NOTE — Patient Instructions (Addendum)
   No immunizations administered today.   Medications reviewed and updated.  No changes recommended at this time.  Your prescription(s) have been submitted to your pharmacy. Please take as directed and contact our office if you believe you are having problem(s) with the medication(s).   Please followup annually

## 2015-06-16 NOTE — Assessment & Plan Note (Signed)
Pain in upper leg with activity - related to left hip fracture in 2014. Uses crutches as needed, physical activity is limited Takes percocet as needed

## 2015-06-16 NOTE — Assessment & Plan Note (Signed)
Lab Results  Component Value Date   HGBA1C 6.5 04/07/2015   Sugars are well controlled continue metformin ad actos

## 2015-06-16 NOTE — Assessment & Plan Note (Signed)
Lipid panel well controlled Continue pravachol at 40 mg daily

## 2015-06-16 NOTE — Assessment & Plan Note (Signed)
BP well controlled Current regimen effective and well tolerated Continue current medications at current doses Continue to monitor BP at home

## 2015-06-16 NOTE — Progress Notes (Addendum)
Subjective:    Patient ID: Donald Carlson, male    DOB: Nov 18, 1945, 70 y.o.   MRN: [id number]  HPI He is here to establish with a new pcp.   He is here for follow up.   Hypertension: He is taking his medication daily. He is compliant with a low sodium diet.  He denies chest pain, palpitations, edema, shortness of breath and regular headaches. He is not exercising regularly.  He does monitor his blood pressure at home and it is controlled.    Diabetes: He is taking his medication daily as prescribed. He is compliant with a diabetic diet. He is not exercising regularly. He monitors his sugars and they have been running low 100's. 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  He checks his feet daily and denies foot lesions. He is up-to-date with an ophthalmology examination.   Hyperlipidemia: He is taking his medication daily. He is compliant with a low fat/cholesterol diet. He is not exercising regularly. He denies myalgias.   Left leg pain:   Takes oxycodone as needed.  His pain started after his left hip fracture.  He has pain in his upper leg with activity mostly.  When the pain is bad he needs to use his crutches.  He is not able to exercise because of that.  Two months ago he broke his left lower leg and is still recovering.    Medications and allergies reviewed with patient and updated if appropriate.  Patient Active Problem List   Diagnosis Date Noted  . Trochanteric bursitis of left hip 04/23/2014  . Bursitis, hip 04/23/2014  . OSA (obstructive sleep apnea) 04/20/2014  . Low serum vitamin B12 02/02/2014  . Bladder cancer ([place]) 11/21/2013  . Melanoma of skin, site unspecified 10/07/2013  . Intertrochanteric fracture of left hip ([place]) 01/30/2013  . Closed left hip fracture ([place]) 06/18/2012  . Small bowel obstruction s/p open lysis of adhesions [id number] 08/23/2011  . DM type 2, uncontrolled, with neuropathy ([place]) 10/02/2008  . History of gout 03/27/2008  . CPK, ABNORMAL 10/23/2007  .  POSTURAL HYPOTENSION, HX OF 10/23/2007  . Mixed hyperlipidemia 01/11/2007  . GLAUCOMA NEC 01/11/2007  . Essential hypertension 01/11/2007  . FEMORAL BRUIT 01/07/2007    Current Outpatient Prescriptions on File Prior to Visit  Medication Sig Dispense Refill  . amLODipine (NORVASC) 5 MG tablet Take 1 tablet (5 mg total) by mouth daily. 90 tablet 3  . betaxolol (BETOPTIC-S) 0.25 % ophthalmic suspension Place 1 drop into both eyes 2 (two) times daily.     Marland Kitchen CALCIUM-VITAMIN D PO Take 1 tablet by mouth daily.    . carvedilol (COREG) 25 MG tablet Take 1 tablet (25 mg total) by mouth 2 (two) times daily with a meal. 180 tablet 1  . Flaxseed, Linseed, (FLAX SEED OIL PO) Take 1 tablet by mouth every morning.    Marland Kitchen glucose blood (ONE TOUCH ULTRA TEST) test strip 1 each by Other route 2 (two) times daily. Use to check blood sugars twice a day Dx e11.9 100 each 1  . lisinopril (PRINIVIL,ZESTRIL) 20 MG tablet Take 1 tablet (20 mg total) by mouth 2 (two) times daily. 180 tablet 1  . metFORMIN (GLUCOPHAGE) 500 MG tablet Take 1 tablet (500 mg total) by mouth 2 (two) times daily. 180 tablet 1  . oxyCODONE-acetaminophen (PERCOCET/ROXICET) 5-325 MG tablet Take 1-2 tablets by mouth every 6 (six) hours as needed for severe pain. 15 tablet 0  . pioglitazone (ACTOS) 15 MG  tablet Take 1 tablet (15 mg total) by mouth daily. 90 tablet 1  . pravastatin (PRAVACHOL) 40 MG tablet Take 1 tablet (40 mg total) by mouth daily. Must keep new appt w/new provider for future refills 90 tablet 0  . Saw Palmetto, Serenoa repens, (SAW PALMETTO PO) Take 1 tablet by mouth every morning.     No current facility-administered medications on file prior to visit.    Past Medical History  Diagnosis Date  . Hypertension   . Diabetes mellitus   . Anemia   . Arthritis   . HERNIORRHAPHY, HX OF 01/07/2007    Qualifier: Diagnosis of  By: Ronnald Ramp CMA, Chemira    . Small bowel obstruction s/p open lysis of adhesions [id number] 08/23/2011  .  Hyperlipidemia   . Kidney stones   . Skin cancer (melanoma) ([place]) 1996    Left/ Back  . Bladder cancer ([place]) 2012    scrapped bladder and inserted liquid chemo  . Pneumonia   . Gout     Past Surgical History  Procedure Laterality Date  . 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 Hernia repair    . Knee arthroscopy      Left knee  . Colonoscopy  07/2002    neg  . Lumbar laminectomy  2004  . Shoulder surgery  05/2008  . Laparotomy  08/23/2011    Procedure: EXPLORATORY LAPAROTOMY;  Surgeon: Edward Jolly, MD;  Location: [facility];  Service: General;  Laterality: N/A;  Lysis of Adhesions/small bowel obstruction  . Abdominal adhesion surgery  08/23/2011    Procedure: EXPLORATORY LAPAROTOMY;  Surgeon: Edward Jolly, MD;  Location: [facility];  Service: General;  Laterality: N/A;  Lysis of Adhesions/small bowel obstruction  . Hip pinning,cannulated Left 06/19/2012    Procedure: CANNULATED HIP PINNING;  Surgeon: Johnn Hai, MD;  Location: [facility];  Service: Orthopedics;  Laterality: Left;  . Fracture surgery    . Total hip arthroplasty Left 01/30/2013    Procedure: REMOVAL OF HARDWARE, LEFT HIP HEMIARTHROPLASTY;  Surgeon: Johnn Hai, MD;  Location: [facility];  Service: Orthopedics;  Laterality: Left;  . Excision/release bursa hip Left 04/23/2014    Procedure: EXCISION OF LEFT HIP TROCHANTERIC BURSA;  Surgeon: Johnn Hai, MD;  Location: [facility];  Service: Orthopedics;  Laterality: Left;    Social History   Social History  . Marital Status: Married    Spouse Name: N/A  . Number of Children: N/A  . Years of Education: N/A   Social History Main Topics  . Smoking status: Never Smoker   . Smokeless tobacco: Never Used  . Alcohol Use: No  . Drug Use: No  . Sexual Activity: Not Asked   Other Topics Concern  . None   Social History Narrative    Family History  Problem Relation Age of Onset  . Diabetes Mother   . Heart attack Mother 48  . Heart disease Mother   .  Heart attack Brother 64  . Lung cancer Brother   . Emphysema Brother   . Cancer Brother     lung  . Cancer Brother     Lymph node/neck  . Breast cancer Sister   . Cancer Sister     breast    Review of Systems  Constitutional: Negative for fever, chills, appetite change and unexpected weight change.  Respiratory: Negative for cough, shortness of breath and wheezing.   Cardiovascular: Negative for chest pain and palpitations. Leg swelling: chorinc, mild.  Gastrointestinal: Negative for nausea and abdominal pain.       Rare gerd  Neurological: Negative for dizziness, light-headedness and headaches.       Objective:   Filed Vitals:   06/16/15 0828  BP: 134/82  Pulse: 62  Temp: 97.8 F (36.6 C)  Resp: 16   Filed Weights   06/16/15 0828  Weight: 202 lb (91.627 kg)   Body mass index is 28.98 kg/(m^2).   Physical Exam Constitutional: Appears well-developed and well-nourished. No distress.  Neck: Neck supple. No tracheal deviation present. No thyromegaly present.  No carotid bruit. No cervical adenopathy.   Cardiovascular: Normal rate, regular rhythm and normal heart sounds.   No murmur heard.  No edema RLE, moderate edema LLE from fracture of tibia Pulmonary/Chest: Effort normal and breath sounds normal. No respiratory distress. No wheezes.        Assessment & Plan:   See Problem List for Assessment and Plan of chronic medical problems.  Follow up annually

## 2015-06-16 NOTE — Progress Notes (Signed)
Pre visit review using our clinic review tool, if applicable. No additional management support is needed unless otherwise documented below in the visit note. 

## 2015-06-25 DIAGNOSIS — R600 Localized edema: Secondary | ICD-10-CM | POA: Diagnosis not present

## 2015-06-25 DIAGNOSIS — S82235D Nondisplaced oblique fracture of shaft of left tibia, subsequent encounter for closed fracture with routine healing: Secondary | ICD-10-CM | POA: Diagnosis not present

## 2015-06-28 ENCOUNTER — Ambulatory Visit (HOSPITAL_COMMUNITY)
Admission: RE | Admit: 2015-06-28 | Discharge: 2015-06-28 | Disposition: A | Discharge status: Home / Self Care | Payer: Medicare Other | Source: Ambulatory Visit | Attending: Cardiology | Admitting: Cardiology

## 2015-06-28 ENCOUNTER — Encounter (HOSPITAL_COMMUNITY): Payer: Self-pay

## 2015-06-28 ENCOUNTER — Other Ambulatory Visit (HOSPITAL_COMMUNITY): Payer: Self-pay | Admitting: Sports Medicine

## 2015-06-28 DIAGNOSIS — R600 Localized edema: Secondary | ICD-10-CM | POA: Diagnosis not present

## 2015-06-28 DIAGNOSIS — M79605 Pain in left leg: Secondary | ICD-10-CM | POA: Diagnosis not present

## 2015-06-28 DIAGNOSIS — E785 Hyperlipidemia, unspecified: Secondary | ICD-10-CM | POA: Insufficient documentation

## 2015-06-28 DIAGNOSIS — E119 Type 2 diabetes mellitus without complications: Secondary | ICD-10-CM | POA: Insufficient documentation

## 2015-06-28 DIAGNOSIS — I10 Essential (primary) hypertension: Secondary | ICD-10-CM | POA: Diagnosis not present

## 2015-07-14 ENCOUNTER — Other Ambulatory Visit: Payer: Self-pay

## 2015-07-14 MED ORDER — METFORMIN HCL 500 MG PO TABS: 500.0000 mg | ORAL_TABLET | Freq: Two times a day (BID) | ORAL | Status: DC

## 2015-07-23 DIAGNOSIS — S82235D Nondisplaced oblique fracture of shaft of left tibia, subsequent encounter for closed fracture with routine healing: Secondary | ICD-10-CM | POA: Diagnosis not present

## 2015-07-23 DIAGNOSIS — R600 Localized edema: Secondary | ICD-10-CM | POA: Diagnosis not present

## 2015-08-20 DIAGNOSIS — S82235D Nondisplaced oblique fracture of shaft of left tibia, subsequent encounter for closed fracture with routine healing: Secondary | ICD-10-CM | POA: Diagnosis not present

## 2015-08-20 DIAGNOSIS — R600 Localized edema: Secondary | ICD-10-CM | POA: Diagnosis not present

## 2015-08-23 ENCOUNTER — Other Ambulatory Visit: Payer: Self-pay | Admitting: Sports Medicine

## 2015-08-23 DIAGNOSIS — S82235D Nondisplaced oblique fracture of shaft of left tibia, subsequent encounter for closed fracture with routine healing: Secondary | ICD-10-CM

## 2015-08-30 ENCOUNTER — Ambulatory Visit
Admission: RE | Admit: 2015-08-30 | Discharge: 2015-08-30 | Disposition: A | Discharge status: Home / Self Care | Payer: Medicare Other | Source: Ambulatory Visit | Attending: Sports Medicine | Admitting: Sports Medicine

## 2015-08-30 DIAGNOSIS — S82242D Displaced spiral fracture of shaft of left tibia, subsequent encounter for closed fracture with routine healing: Secondary | ICD-10-CM | POA: Diagnosis not present

## 2015-08-30 DIAGNOSIS — S82235D Nondisplaced oblique fracture of shaft of left tibia, subsequent encounter for closed fracture with routine healing: Secondary | ICD-10-CM

## 2015-09-03 DIAGNOSIS — H401231 Low-tension glaucoma, bilateral, mild stage: Secondary | ICD-10-CM | POA: Diagnosis not present

## 2015-09-15 DIAGNOSIS — S82235G Nondisplaced oblique fracture of shaft of left tibia, subsequent encounter for closed fracture with delayed healing: Secondary | ICD-10-CM | POA: Diagnosis not present

## 2015-09-15 DIAGNOSIS — R600 Localized edema: Secondary | ICD-10-CM | POA: Diagnosis not present

## 2015-09-22 DIAGNOSIS — M25552 Pain in left hip: Secondary | ICD-10-CM | POA: Diagnosis not present

## 2015-09-22 DIAGNOSIS — M7062 Trochanteric bursitis, left hip: Secondary | ICD-10-CM | POA: Diagnosis not present

## 2015-10-05 DIAGNOSIS — Z96642 Presence of left artificial hip joint: Secondary | ICD-10-CM | POA: Diagnosis not present

## 2015-10-05 DIAGNOSIS — M7062 Trochanteric bursitis, left hip: Secondary | ICD-10-CM | POA: Diagnosis not present

## 2015-10-05 DIAGNOSIS — Z471 Aftercare following joint replacement surgery: Secondary | ICD-10-CM | POA: Diagnosis not present

## 2015-10-05 DIAGNOSIS — M25552 Pain in left hip: Secondary | ICD-10-CM | POA: Diagnosis not present

## 2015-10-06 ENCOUNTER — Other Ambulatory Visit (HOSPITAL_COMMUNITY): Payer: Self-pay | Admitting: Orthopedic Surgery

## 2015-10-06 DIAGNOSIS — M25552 Pain in left hip: Secondary | ICD-10-CM

## 2015-10-11 ENCOUNTER — Other Ambulatory Visit: Payer: Self-pay | Admitting: *Deleted

## 2015-10-11 MED ORDER — CARVEDILOL 25 MG PO TABS: 25.0000 mg | ORAL_TABLET | Freq: Two times a day (BID) | ORAL | 2 refills | Status: DC

## 2015-10-13 DIAGNOSIS — S82235D Nondisplaced oblique fracture of shaft of left tibia, subsequent encounter for closed fracture with routine healing: Secondary | ICD-10-CM | POA: Diagnosis not present

## 2015-10-14 ENCOUNTER — Encounter (HOSPITAL_COMMUNITY)
Admission: RE | Admit: 2015-10-14 | Discharge: 2015-10-14 | Disposition: A | Discharge status: Home / Self Care | Payer: Medicare Other | Source: Ambulatory Visit | Attending: Orthopedic Surgery | Admitting: Orthopedic Surgery

## 2015-10-14 DIAGNOSIS — M25552 Pain in left hip: Secondary | ICD-10-CM

## 2015-10-14 DIAGNOSIS — Z96642 Presence of left artificial hip joint: Secondary | ICD-10-CM | POA: Diagnosis not present

## 2015-10-14 DIAGNOSIS — Z471 Aftercare following joint replacement surgery: Secondary | ICD-10-CM | POA: Diagnosis not present

## 2015-10-14 MED ORDER — TECHNETIUM TC 99M MEDRONATE IV KIT
25.0000 | PACK | Freq: Once | INTRAVENOUS | Status: AC | PRN
  Administered 2015-10-14: 26.4 via INTRAVENOUS

## 2015-10-19 DIAGNOSIS — M1612 Unilateral primary osteoarthritis, left hip: Secondary | ICD-10-CM | POA: Diagnosis not present

## 2015-11-02 DIAGNOSIS — M25552 Pain in left hip: Secondary | ICD-10-CM | POA: Diagnosis not present

## 2015-11-02 DIAGNOSIS — M7062 Trochanteric bursitis, left hip: Secondary | ICD-10-CM | POA: Diagnosis not present

## 2015-11-02 DIAGNOSIS — Z96642 Presence of left artificial hip joint: Secondary | ICD-10-CM | POA: Diagnosis not present

## 2015-11-02 DIAGNOSIS — Z471 Aftercare following joint replacement surgery: Secondary | ICD-10-CM | POA: Diagnosis not present

## 2015-11-05 DIAGNOSIS — C672 Malignant neoplasm of lateral wall of bladder: Secondary | ICD-10-CM | POA: Diagnosis not present

## 2015-11-10 DIAGNOSIS — S82235D Nondisplaced oblique fracture of shaft of left tibia, subsequent encounter for closed fracture with routine healing: Secondary | ICD-10-CM | POA: Diagnosis not present

## 2015-12-03 DIAGNOSIS — E119 Type 2 diabetes mellitus without complications: Secondary | ICD-10-CM | POA: Diagnosis not present

## 2015-12-03 LAB — HM DIABETES EYE EXAM

## 2015-12-09 ENCOUNTER — Encounter: Payer: Self-pay | Admitting: Internal Medicine

## 2015-12-10 ENCOUNTER — Encounter: Payer: Self-pay | Admitting: Internal Medicine

## 2015-12-10 ENCOUNTER — Ambulatory Visit (INDEPENDENT_AMBULATORY_CARE_PROVIDER_SITE_OTHER): Payer: Medicare Other | Admitting: Internal Medicine

## 2015-12-10 VITALS — BP 124/70 | HR 56 | Temp 97.7°F | Resp 16 | Wt 199.0 lb

## 2015-12-10 DIAGNOSIS — Z23 Encounter for immunization: Secondary | ICD-10-CM | POA: Diagnosis not present

## 2015-12-10 DIAGNOSIS — Z01818 Encounter for other preprocedural examination: Secondary | ICD-10-CM | POA: Diagnosis not present

## 2015-12-10 DIAGNOSIS — E1165 Type 2 diabetes mellitus with hyperglycemia: Secondary | ICD-10-CM

## 2015-12-10 DIAGNOSIS — IMO0002 Reserved for concepts with insufficient information to code with codable children: Secondary | ICD-10-CM

## 2015-12-10 DIAGNOSIS — R079 Chest pain, unspecified: Secondary | ICD-10-CM

## 2015-12-10 DIAGNOSIS — E1140 Type 2 diabetes mellitus with diabetic neuropathy, unspecified: Secondary | ICD-10-CM | POA: Diagnosis not present

## 2015-12-10 DIAGNOSIS — I10 Essential (primary) hypertension: Secondary | ICD-10-CM

## 2015-12-10 NOTE — Patient Instructions (Signed)
  An EKG was done today.   Medications reviewed and updated.  No changes recommended at this time.   We will send your pre-operative clearance to Dr Lyla Glassing.    Please call with any questions or concerns.

## 2015-12-10 NOTE — Progress Notes (Signed)
Subjective:    Patient ID: Donald Carlson, male    DOB: 1946/01/15, 70 y.o.   MRN: [id number]  HPI He is here for medical clearance for left total hip revision scheduled for 01/22/16. Dr  Lyla Glassing requested he come for medical clearance.    He has no concerns.    He has had surgery in the past and tolerated them well.  He denies any side effects with anesthesia.  He denies a personal or family history of bleeding disorders or blood clots.  He is not able to exercise at this time due to the hip pain.  He is doing all the yard work, Conservation officer, historic buildings - he denies chest pain, palpiations, sob and lightheadedness with yard work.   This week he had a few episodes of lightheadedness with standing up after bending over.  It was transient.    Hypertension: He is taking his medication daily. He is compliant with a low sodium diet.  He does monitor his blood pressure at home and the average is 125/70.     Diabetes: He is taking his medication daily as prescribed. He is compliant with a diabetic diet.  He monitors his sugars and they have been running on average 140.    Medications and allergies reviewed with patient and updated if appropriate.  Patient Active Problem List   Diagnosis Date Noted  . Preoperative clearance 12/10/2015  . Left leg pain 06/16/2015  . Trochanteric bursitis of left hip 04/23/2014  . OSA (obstructive sleep apnea) 04/20/2014  . Low serum vitamin B12 02/02/2014  . Bladder cancer ([place]) 11/21/2013  . Melanoma of skin, site unspecified 10/07/2013  . Closed left hip fracture ([place]) 06/18/2012  . Small bowel obstruction s/p open lysis of adhesions [id number] 08/23/2011  . DM type 2, uncontrolled, with neuropathy ([place]) 10/02/2008  . History of gout 03/27/2008  . CPK, ABNORMAL 10/23/2007  . Mixed hyperlipidemia 01/11/2007  . GLAUCOMA NEC 01/11/2007  . Essential hypertension 01/11/2007  . FEMORAL BRUIT 01/07/2007    Current Outpatient Prescriptions on File Prior to  Visit  Medication Sig Dispense Refill  . amLODipine (NORVASC) 5 MG tablet Take 1 tablet (5 mg total) by mouth daily. 90 tablet 3  . aspirin EC 81 MG tablet Take 1 tablet (81 mg total) by mouth daily.    . betaxolol (BETOPTIC-S) 0.25 % ophthalmic suspension Place 1 drop into both eyes 2 (two) times daily.     Marland Kitchen CALCIUM-VITAMIN D PO Take 1 tablet by mouth daily.    . carvedilol (COREG) 25 MG tablet Take 1 tablet (25 mg total) by mouth 2 (two) times daily with a meal. 180 tablet 2  . Flaxseed, Linseed, (FLAX SEED OIL PO) Take 1 tablet by mouth every morning.    Marland Kitchen glucose blood (ONE TOUCH ULTRA TEST) test strip 1 each by Other route 2 (two) times daily. Use to check blood sugars twice a day Dx e11.9 100 each 1  . lisinopril (PRINIVIL,ZESTRIL) 20 MG tablet Take 1 tablet (20 mg total) by mouth 2 (two) times daily. 180 tablet 3  . metFORMIN (GLUCOPHAGE) 500 MG tablet Take 1 tablet (500 mg total) by mouth 2 (two) times daily. 180 tablet 3  . pioglitazone (ACTOS) 15 MG tablet Take 1 tablet (15 mg total) by mouth daily. 90 tablet 3  . pravastatin (PRAVACHOL) 40 MG tablet Take 1 tablet (40 mg total) by mouth daily. 90 tablet 3  . Saw Palmetto, Serenoa repens, (SAW PALMETTO PO) Take  1 tablet by mouth every morning.     No current facility-administered medications on file prior to visit.     Past Medical History:  Diagnosis Date  . Anemia   . Arthritis   . Bladder cancer ([place]) 2012   scrapped bladder and inserted liquid chemo  . Diabetes mellitus   . Gout   . HERNIORRHAPHY, HX OF 01/07/2007   Qualifier: Diagnosis of  By: Ronnald Ramp CMA, Chemira    . Hyperlipidemia   . Hypertension   . Kidney stones   . Pneumonia   . Skin cancer (melanoma) ([place]) 1996   Left/ Back  . Small bowel obstruction s/p open lysis of adhesions [id number] 08/23/2011    Past Surgical History:  Procedure Laterality Date  . ABDOMINAL ADHESION SURGERY  08/23/2011   Procedure: EXPLORATORY LAPAROTOMY;  Surgeon: Edward Jolly,  MD;  Location: [facility];  Service: General;  Laterality: N/A;  Lysis of Adhesions/small bowel obstruction  . 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 COLONOSCOPY  07/2002   neg  . EXCISION/RELEASE BURSA HIP Left 04/23/2014   Procedure: EXCISION OF LEFT HIP TROCHANTERIC BURSA;  Surgeon: Johnn Hai, MD;  Location: [facility];  Service: Orthopedics;  Laterality: Left;  . FRACTURE SURGERY    . HERNIA REPAIR    . HIP PINNING,CANNULATED Left 06/19/2012   Procedure: CANNULATED HIP PINNING;  Surgeon: Johnn Hai, MD;  Location: [facility];  Service: Orthopedics;  Laterality: Left;  . KNEE ARTHROSCOPY     Left knee  . LAPAROTOMY  08/23/2011   Procedure: EXPLORATORY LAPAROTOMY;  Surgeon: Edward Jolly, MD;  Location: [facility];  Service: General;  Laterality: N/A;  Lysis of Adhesions/small bowel obstruction  . LUMBAR LAMINECTOMY  2004  . SHOULDER SURGERY  05/2008  . TOTAL HIP ARTHROPLASTY Left 01/30/2013   Procedure: REMOVAL OF HARDWARE, LEFT HIP HEMIARTHROPLASTY;  Surgeon: Johnn Hai, MD;  Location: [facility];  Service: Orthopedics;  Laterality: Left;    Social History   Social History  . Marital status: Married    Spouse name: N/A  . Number of children: N/A  . Years of education: N/A   Social History Main Topics  . Smoking status: Never Smoker  . Smokeless tobacco: Never Used  . Alcohol use No  . Drug use: No  . Sexual activity: Not Asked   Other Topics Concern  . None   Social History Narrative   No regular exercise - physical activity limited by left leg pain    Family History  Problem Relation Age of Onset  . Diabetes Mother   . Heart attack Mother 79  . Heart disease Mother   . Heart attack Brother 55  . Lung cancer Brother   . Emphysema Brother   . Cancer Brother     lung  . Breast cancer Sister   . Cancer Sister     breast  . Cancer Brother     Lymph node/neck    Review of Systems  Constitutional: Negative for appetite change, chills, fatigue and fever.    Eyes: Positive for visual disturbance (occasional, transient blurry vision - eye exam up to date).  Respiratory: Negative for cough, shortness of breath and wheezing.   Cardiovascular: Positive for chest pain (left sided for at least 10 years - once a week, sharp pain that last 10-15 seconds and goes away, not related to any activity). Negative for palpitations.  Gastrointestinal: Positive for abdominal pain (occasional sharp pain from hernia or  scar tissue ). Negative for blood in stool, constipation, diarrhea and nausea.       Gerd occasional  Endocrine: Negative for polydipsia and polyuria.  Genitourinary: Negative for dysuria and hematuria.  Skin: Negative for rash.  Neurological: Positive for light-headedness (with standing after bending over). Negative for dizziness and headaches.       Objective:   Vitals:   12/10/15 1051  BP: 124/70  Pulse: (!) 56  Resp: 16  Temp: 97.7 F (36.5 C)   Filed Weights   12/10/15 1051  Weight: 199 lb (90.3 kg)   Body mass index is 28.55 kg/m.   Physical Exam Constitutional: He appears well-developed and well-nourished. No distress.  HENT:  Head: Normocephalic and atraumatic.  Right Ear: External ear normal.  Left Ear: External ear normal.  Mouth/Throat: Oropharynx is clear and moist.  Normal ear canals and TM b/l  Eyes: Conjunctivae and EOM are normal.  Neck: Neck supple. No tracheal deviation present. No thyromegaly present.  No carotid bruit  Cardiovascular: Normal rate, regular rhythm, normal heart sounds and intact distal pulses.  No murmur heard. Pulmonary/Chest: Effort normal and breath sounds normal. No respiratory distress. He has no wheezes. He has no rales.  Abdominal: Soft. Mild ventral hernia. umbilical and lower abdominal scar. He exhibits no distension. There is minimal tenderness in central upper abdomen w/o rebound or guarding- this is chronic and likely from scar tissue.  Musculoskeletal: He exhibits no edema.   Lymphadenopathy:  He has no cervical adenopathy.  Skin: Skin is warm and dry. He is not diaphoretic.  Psychiatric: He has a normal mood and affect. His behavior is normal.      Assessment & Plan:   See Problem List for Assessment and Plan of chronic medical problems.

## 2015-12-10 NOTE — Assessment & Plan Note (Signed)
He mentions chest pain today that he has had for years.  It is sharp in nature, intermittent, transient lasting a few seconds and not associated with activity An EKG is unchanged His chest pain is not cardiac in nature No further evaluation or testing is needed

## 2015-12-10 NOTE — Assessment & Plan Note (Signed)
BP well controlled Current regimen effective and well tolerated Continue current medications at current doses  

## 2015-12-10 NOTE — Progress Notes (Signed)
Pre visit review using our clinic review tool, if applicable. No additional management support is needed unless otherwise documented below in the visit note. 

## 2015-12-10 NOTE — Assessment & Plan Note (Signed)
He is low risk for a low risk procedure His chronic medical problems are well controlled No further evaluation or testing is needed Clearance paperwork sent to surgeon.

## 2015-12-10 NOTE — Assessment & Plan Note (Signed)
Lab Results  Component Value Date   HGBA1C 6.5 04/07/2015   Sugars well controlled Continue current medications at current dose He will hold his diabetic medications the day of surgery

## 2015-12-16 NOTE — Progress Notes (Signed)
Pt is being scheduled for preop appt; please place surgical orders in epic. Thanks.  

## 2015-12-17 ENCOUNTER — Ambulatory Visit: Payer: Self-pay | Admitting: Orthopedic Surgery

## 2015-12-19 HISTORY — PX: OTHER SURGICAL HISTORY: SHX169

## 2015-12-29 ENCOUNTER — Ambulatory Visit: Payer: Self-pay | Admitting: Orthopedic Surgery

## 2015-12-29 NOTE — H&P (Signed)
Donald Carlson  Patient is admitted for left revision Donald hip arthroplasty.  Subjective:  Chief Complaint: left hip pain  HPI: Donald Carlson, 70 y.o. male, has a history of pain and functional disability in the left hip due to arthritis and patient has failed non-surgical conservative treatments for greater than 12 weeks to include NSAID's and/or analgesics, flexibility and strengthening excercises, use of assistive devices and activity modification. 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  Onset of symptoms was gradual starting 3 years ago with gradually worsening course since that time.  Prior procedures on the left hip include hemi-arthroplasty and hip pinning.  Patient currently rates pain in the left hip at 10 out of 10 with activity.  There is night pain, worsening of pain with activity and weight bearing and pain that interfers with activities of daily living. Patient has evidence of joint space narrowing by imaging studies.  This condition presents safety issues increasing the risk of falls.  This patient has had proximal femur fracture.  There is no current active infection.  Patient Active Problem List   Diagnosis Date Noted  . Preoperative clearance 12/10/2015  . Pain in the chest 12/10/2015  . Left leg pain 06/16/2015  . Trochanteric bursitis of left hip 04/23/2014  . OSA (obstructive sleep apnea) 04/20/2014  . Low serum vitamin B12 02/02/2014  . Bladder cancer ([facility]) 11/21/2013  . Melanoma of skin, site unspecified 10/07/2013  . Closed left hip fracture ([facility]) 06/18/2012  . Small bowel obstruction s/p open lysis of adhesions Jun2013 08/23/2011  . DM type 2, uncontrolled, with neuropathy ([facility]) 10/02/2008  . History of gout 03/27/2008  . CPK, ABNORMAL 10/23/2007  . Mixed hyperlipidemia 01/11/2007  . GLAUCOMA NEC 01/11/2007  . Essential hypertension 01/11/2007  . FEMORAL BRUIT  01/07/2007   Past Medical History:  Diagnosis Date  . Anemia   . Arthritis   . Bladder cancer ([facility]) 2012   scrapped bladder and inserted liquid chemo  . Diabetes mellitus   . Gout   . HERNIORRHAPHY, HX OF 01/07/2007   Qualifier: Diagnosis of  By: Jones CMA, Chemira    . Hyperlipidemia   . Hypertension   . Kidney stones   . Pneumonia   . Skin cancer (melanoma) ([facility]) 1996   Left/ Back  . Small bowel obstruction s/p open lysis of adhesions Jun2013 08/23/2011    Past Surgical History:  Procedure Laterality Date  . ABDOMINAL ADHESION SURGERY  08/23/2011   Procedure: EXPLORATORY LAPAROTOMY;  Surgeon: Benjamin T Hoxworth, MD;  [facility];  Service: General;  Laterality: N/A;  Lysis of Adhesions/small bowel obstruction  . 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 COLONOSCOPY  07/2002   neg  . EXCISION/RELEASE BURSA HIP Left 04/23/2014   Procedure: EXCISION OF LEFT HIP TROCHANTERIC BURSA;  Surgeon: Jeffrey C Beane, MD;  Location: [facility];  Service: Orthopedics;  Laterality: Left;  . FRACTURE SURGERY    . HERNIA REPAIR    . HIP PINNING,CANNULATED Left 06/19/2012   Procedure: CANNULATED HIP PINNING;  Surgeon: Jeffrey C Beane, MD;  Location[facility];  Service: Orthopedics;  Laterality: Left;  . KNEE ARTHROSCOPY     Left knee  . LAPAROTOMY  08/23/2011   Procedure: EXPLORATORY LAPAROTOMY;  Surgeon: Benjamin T Hoxworth, MD;  Location: [facility];  Service: General;  Laterality: N/A;  Lysis of Adhesions/small bowel obstruction  . LUMBAR LAMINECTOMY  2004  .   SHOULDER SURGERY  05/2008  . Donald HIP ARTHROPLASTY Left 01/30/2013   Procedure: REMOVAL OF HARDWARE, LEFT HIP HEMIARTHROPLASTY;  Surgeon: Johnn Hai, MD;  Location: [facility];  Service: Orthopedics;  Laterality: Left;     (Not in a hospital admission) No Known Allergies  Social History  Substance Use Topics  . Smoking status: Never Smoker  . Smokeless tobacco: Never Used  . Alcohol use No    Family History  Problem Relation Age  of Onset  . Diabetes Mother   . Heart attack Mother 20  . Heart disease Mother   . Heart attack Brother 62  . Lung cancer Brother   . Emphysema Brother   . Cancer Brother     lung  . Breast cancer Sister   . Cancer Sister     breast  . Cancer Brother     Lymph node/neck      Review of Systems  Constitutional: Negative.   HENT: Negative.   Eyes: Negative.   Respiratory: Negative.   Cardiovascular: Negative.   Gastrointestinal: Negative.   Genitourinary: Negative.   Musculoskeletal: Negative.   Skin: Negative.   Neurological: Negative.   Endo/Heme/Allergies: Negative.   Psychiatric/Behavioral: Negative.     Objective:  Physical Exam  Vitals reviewed. Constitutional: He is oriented to person, place, and time. He appears well-developed and well-nourished.  HENT:  Head: Normocephalic.  Eyes: Conjunctivae and EOM are normal. Pupils are equal, round, and reactive to light.  Neck: Normal range of motion. Neck supple.  Cardiovascular: Normal rate.   Respiratory: No respiratory distress.  GI: Soft. He exhibits no distension.  Genitourinary:  Genitourinary Comments: deferred  Musculoskeletal:       Legs: Healed posterior incision. Pain with ROM And WB.  Neurological: He is alert and oriented to person, place, and time.  Skin: Skin is warm and dry.  Psychiatric: He has a normal mood and affect. His behavior is normal. Judgment and thought content normal.    Vital signs in last 24 hours: @VSRANGES @   Labs:   Estimated body mass index is 28.55 kg/m as calculated from the following:   Height as of 11/27/14: 5\' 10"  (1.778 m).   Weight as of 12/10/15: 90.3 kg (199 lb).  Imaging Review:  Plain radiographs demonstrate severe degenerative joint disease of the left hip(s). There is no loosening of the femoral component.The bone quality appears to be adequate for age and reported activity level.  Assessment/Plan:  End stage arthritis, left hip(s) with failed previous  hemiarthroplasty.  The patient history, physical examination, clinical judgement of the provider and imaging studies are consistent with end stage degenerative joint disease of the left hip(s), previous Donald hip arthroplasty. Revision Donald hip arthroplasty is deemed medically necessary. The treatment options including medical management, injection therapy, arthroscopy and arthroplasty were discussed at length. The risks and benefits of Donald hip arthroplasty were presented and reviewed. The risks due to aseptic loosening, infection, stiffness, dislocation/subluxation,  thromboembolic complications and other imponderables were discussed.  The patient acknowledged the explanation, agreed to proceed with the plan and consent was signed. Patient is being admitted for inpatient treatment for surgery, pain control, PT, OT, prophylactic antibiotics, VTE prophylaxis, progressive ambulation and ADL's and discharge planning. The patient is planning to be discharged home with home health services

## 2015-12-30 DIAGNOSIS — L905 Scar conditions and fibrosis of skin: Secondary | ICD-10-CM | POA: Diagnosis not present

## 2015-12-30 DIAGNOSIS — L821 Other seborrheic keratosis: Secondary | ICD-10-CM | POA: Diagnosis not present

## 2015-12-30 DIAGNOSIS — D1801 Hemangioma of skin and subcutaneous tissue: Secondary | ICD-10-CM | POA: Diagnosis not present

## 2015-12-30 DIAGNOSIS — L812 Freckles: Secondary | ICD-10-CM | POA: Diagnosis not present

## 2015-12-30 DIAGNOSIS — Z85828 Personal history of other malignant neoplasm of skin: Secondary | ICD-10-CM | POA: Diagnosis not present

## 2015-12-30 DIAGNOSIS — L570 Actinic keratosis: Secondary | ICD-10-CM | POA: Diagnosis not present

## 2016-01-03 NOTE — Patient Instructions (Signed)
Donald Carlson  01/03/2016   Your procedure is scheduled on: 01/14/2016    Report to [facility] Main  Entrance take [place]  elevators to 3rd floor to  [place] at  [place] AM.  Call this number if you have problems the morning of surgery [phone]   Remember: ONLY 1 PERSON MAY GO WITH YOU TO SHORT STAY TO GET  READY MORNING OF [place].  Do not eat food or drink liquids :After Midnight.     Take these medicines the morning of surgery with A SIP OF WATER: Amlodipine ( Norvasc0, Betoptic eye drops, Carvedilol ( coreg)                                You may not have any metal on your body including hair pins and              piercings  Do not wear jewelry,  lotions, powders or perfumes, deodorant                          Men may shave face and neck.   Do not bring valuables to the hospital. [place].  Contacts, dentures or bridgework may not be worn into surgery.  Leave suitcase in the car. After surgery it may be brought to your room.       Special Instructions: coughing and deep breathing exercises, leg exercises               Please read over the following fact sheets you were given: _____________________________________________________________________             [facility] - Preparing for Surgery Before surgery, you can play an important role.  Because skin is not sterile, your skin needs to be as free of germs as possible.  You can reduce the number of germs on your skin by washing with CHG (chlorahexidine gluconate) soap before surgery.  CHG is an antiseptic cleaner which kills germs and bonds with the skin to continue killing germs even after washing. Please DO NOT use if you have an allergy to CHG or antibacterial soaps.  If your skin becomes reddened/irritated stop using the CHG and inform your nurse when you arrive at Short Stay. Do not shave (including legs and underarms) for at  least 48 hours prior to the first CHG shower.  You may shave your face/neck. Please follow these instructions carefully:  1.  Shower with CHG Soap the night before surgery and the  morning of Surgery.  2.  If you choose to wash your hair, wash your hair first as usual with your  normal  shampoo.  3.  After you shampoo, rinse your hair and body thoroughly to remove the  shampoo.                           4.  Use CHG as you would any other liquid soap.  You can apply chg directly  to the skin and wash                       Gently with a scrungie or clean  washcloth.  5.  Apply the CHG Soap to your body ONLY FROM THE NECK DOWN.   Do not use on face/ open                           Wound or open sores. Avoid contact with eyes, ears mouth and genitals (private parts).                       Wash face,  Genitals (private parts) with your normal soap.             6.  Wash thoroughly, paying special attention to the area where your surgery  will be performed.  7.  Thoroughly rinse your body with warm water from the neck down.  8.  DO NOT shower/wash with your normal soap after using and rinsing off  the CHG Soap.                9.  Pat yourself dry with a clean towel.            10.  Wear clean pajamas.            11.  Place clean sheets on your bed the night of your first shower and do not  sleep with pets. Day of Surgery : Do not apply any lotions/deodorants the morning of surgery.  Please wear clean clothes to the hospital/surgery center.  FAILURE TO FOLLOW THESE INSTRUCTIONS MAY RESULT IN THE CANCELLATION OF YOUR SURGERY PATIENT SIGNATURE_________________________________  NURSE SIGNATURE__________________________________  ________________________________________________________________________  WHAT IS A BLOOD TRANSFUSION? Blood Transfusion Information  A transfusion is the replacement of blood or some of its parts. Blood is made up of multiple cells which provide different functions.  Red  blood cells carry oxygen and are used for blood loss replacement.  White blood cells fight against infection.  Platelets control bleeding.  Plasma helps clot blood.  Other blood products are available for specialized needs, such as hemophilia or other clotting disorders. BEFORE THE TRANSFUSION  Who gives blood for transfusions?   Healthy volunteers who are fully evaluated to make sure their blood is safe. This is blood bank blood. Transfusion therapy is the safest it has ever been in the practice of medicine. Before blood is taken from a donor, a complete history is taken to make sure that person has no history of diseases nor engages in risky social behavior (examples are intravenous drug use or sexual activity with multiple partners). The donor's travel history is screened to minimize risk of transmitting infections, such as malaria. The donated blood is tested for signs of infectious diseases, such as HIV and hepatitis. The blood is then tested to be sure it is compatible with you in order to minimize the chance of a transfusion reaction. If you or a relative donates blood, this is often done in anticipation of surgery and is not appropriate for emergency situations. It takes many days to process the donated blood. RISKS AND COMPLICATIONS Although transfusion therapy is very safe and saves many lives, the main dangers of transfusion include:   Getting an infectious disease.  Developing a transfusion reaction. This is an allergic reaction to something in the blood you were given. Every precaution is taken to prevent this. The decision to have a blood transfusion has been considered carefully by your caregiver before blood is given. Blood is not given unless the benefits outweigh the risks. AFTER THE TRANSFUSION  Right after receiving a blood transfusion, you will usually feel much better and more energetic. This is especially true if your red blood cells have gotten low (anemic). The  transfusion raises the level of the red blood cells which carry oxygen, and this usually causes an energy increase.  The nurse administering the transfusion will monitor you carefully for complications. HOME CARE INSTRUCTIONS  No special instructions are needed after a transfusion. You may find your energy is better. Speak with your caregiver about any limitations on activity for underlying diseases you may have. SEEK MEDICAL CARE IF:   Your condition is not improving after your transfusion.  You develop redness or irritation at the intravenous (IV) site. SEEK IMMEDIATE MEDICAL CARE IF:  Any of the following symptoms occur over the next 12 hours:  Shaking chills.  You have a temperature by mouth above 102 F (38.9 C), not controlled by medicine.  Chest, back, or muscle pain.  People around you feel you are not acting correctly or are confused.  Shortness of breath or difficulty breathing.  Dizziness and fainting.  You get a rash or develop hives.  You have a decrease in urine output.  Your urine turns a dark color or changes to pink, red, or brown. Any of the following symptoms occur over the next 10 days:  You have a temperature by mouth above 102 F (38.9 C), not controlled by medicine.  Shortness of breath.  Weakness after normal activity.  The white part of the eye turns yellow (jaundice).  You have a decrease in the amount of urine or are urinating less often.  Your urine turns a dark color or changes to pink, red, or brown. Document Released: 03/03/2000 Document Revised: 05/29/2011 Document Reviewed: 10/21/2007 ExitCare Patient Information 2014 [place].  _______________________________________________________________________  Incentive Spirometer  An incentive spirometer is a tool that can help keep your lungs clear and active. This tool measures how well you are filling your lungs with each breath. Taking long deep breaths may help reverse or  decrease the chance of developing breathing (pulmonary) problems (especially infection) following:  A long period of time when you are unable to move or be active. BEFORE THE PROCEDURE   If the spirometer includes an indicator to show your best effort, your nurse or respiratory therapist will set it to a desired goal.  If possible, sit up straight or lean slightly forward. Try not to slouch.  Hold the incentive spirometer in an upright position. INSTRUCTIONS FOR USE  1. Sit on the edge of your bed if possible, or sit up as far as you can in bed or on a chair. 2. Hold the incentive spirometer in an upright position. 3. Breathe out normally. 4. Place the mouthpiece in your mouth and seal your lips tightly around it. 5. Breathe in slowly and as deeply as possible, raising the piston or the ball toward the top of the column. 6. Hold your breath for 3-5 seconds or for as long as possible. Allow the piston or ball to fall to the bottom of the column. 7. Remove the mouthpiece from your mouth and breathe out normally. 8. Rest for a few seconds and repeat Steps 1 through 7 at least 10 times every 1-2 hours when you are awake. Take your time and take a few normal breaths between deep breaths. 9. The spirometer may include an indicator to show your best effort. Use the indicator as a goal to work toward during each repetition. 10. After  each set of 10 deep breaths, practice coughing to be sure your lungs are clear. If you have an incision (the cut made at the time of surgery), support your incision when coughing by placing a pillow or rolled up towels firmly against it. Once you are able to get out of bed, walk around indoors and cough well. You may stop using the incentive spirometer when instructed by your caregiver.  RISKS AND COMPLICATIONS  Take your time so you do not get dizzy or light-headed.  If you are in pain, you may need to take or ask for pain medication before doing incentive spirometry.  It is harder to take a deep breath if you are having pain. AFTER USE  Rest and breathe slowly and easily.  It can be helpful to keep track of a log of your progress. Your caregiver can provide you with a simple table to help with this. If you are using the spirometer at home, follow these instructions: [place] IF:   You are having difficultly using the spirometer.  You have trouble using the spirometer as often as instructed.  Your pain medication is not giving enough relief while using the spirometer.  You develop fever of 100.5 F (38.1 C) or higher. SEEK IMMEDIATE MEDICAL CARE IF:   You cough up bloody sputum that had not been present before.  You develop fever of 102 F (38.9 C) or greater.  You develop worsening pain at or near the incision site. MAKE SURE YOU:   Understand these instructions.  Will watch your condition.  Will get help right away if you are not doing well or get worse. Document Released: 07/17/2006 Document Revised: 05/29/2011 Document Reviewed: 09/17/2006 ExitCare Patient Information 2014 ExitCare, [place].   ________________________________________________________________________ How to Manage Your Diabetes Before and After Surgery  Why is it important to control my blood sugar before and after surgery? . Improving blood sugar levels before and after surgery helps healing and can limit problems. . A way of improving blood sugar control is eating a healthy diet by: o  Eating less sugar and carbohydrates o  Increasing activity/exercise o  Talking with your doctor about reaching your blood sugar goals . High blood sugars (greater than 180 mg/dL) can raise your risk of infections and slow your recovery, so you will need to focus on controlling your diabetes during the weeks before surgery. . Make sure that the doctor who takes care of your diabetes knows about your planned surgery including the date and location.  How do I manage my blood  sugar before surgery? . Check your blood sugar at least 4 times a day, starting 2 days before surgery, to make sure that the level is not too high or low. o Check your blood sugar the morning of your surgery when you wake up and every 2 hours until you get to the Short Stay unit. . If your blood sugar is less than 70 mg/dL, you will need to treat for low blood sugar: o Do not take insulin. o Treat a low blood sugar (less than 70 mg/dL) with  cup of clear juice (cranberry or apple), 4 glucose tablets, OR glucose gel. o Recheck blood sugar in 15 minutes after treatment (to make sure it is greater than 70 mg/dL). If your blood sugar is not greater than 70 mg/dL on recheck, call [phone] for further instructions. . Report your blood sugar to the short stay nurse when you get to Short Stay.  . If  you are admitted to the hospital after surgery: o Your blood sugar will be checked by the staff and you will probably be given insulin after surgery (instead of oral diabetes medicines) to make sure you have good blood sugar levels. o The goal for blood sugar control after surgery is 80-180 mg/dL.   WHAT DO I DO ABOUT MY DIABETES MEDICATION?   [place] Do not take oral diabetes medicines (pills) the morning of surgery.  . THE NIGHT BEFORE SURGERY, take ___________ units of ___________insulin.       . THE MORNING OF SURGERY, take _____________ units of __________insulin.  . The day of surgery, do not take other diabetes injectables, including Byetta (exenatide), Bydureon (exenatide ER), Victoza (liraglutide), or Trulicity (dulaglutide).  . If your CBG is greater than 220 mg/dL, you may take  of your sliding scale (correction) dose of insulin.  Patient Signature:  Date:   Nurse Signature:  Date:   Reviewed and Endorsed by [facility] Patient Education Committee, August 2015

## 2016-01-05 ENCOUNTER — Encounter (HOSPITAL_COMMUNITY): Payer: Self-pay

## 2016-01-05 ENCOUNTER — Encounter (HOSPITAL_COMMUNITY)
Admission: RE | Admit: 2016-01-05 | Discharge: 2016-01-05 | Disposition: A | Discharge status: Home / Self Care | Payer: Medicare Other | Source: Ambulatory Visit | Attending: Orthopedic Surgery | Admitting: Orthopedic Surgery

## 2016-01-05 DIAGNOSIS — Z01812 Encounter for preprocedural laboratory examination: Secondary | ICD-10-CM | POA: Diagnosis not present

## 2016-01-05 DIAGNOSIS — I10 Essential (primary) hypertension: Secondary | ICD-10-CM | POA: Insufficient documentation

## 2016-01-05 DIAGNOSIS — E119 Type 2 diabetes mellitus without complications: Secondary | ICD-10-CM | POA: Insufficient documentation

## 2016-01-05 DIAGNOSIS — M25552 Pain in left hip: Secondary | ICD-10-CM | POA: Insufficient documentation

## 2016-01-05 HISTORY — DX: Personal history of urinary calculi: Z87.442

## 2016-01-05 HISTORY — DX: Unspecified fracture of left lower leg, initial encounter for closed fracture: S82.92XA

## 2016-01-05 HISTORY — DX: Cardiac murmur, unspecified: R01.1

## 2016-01-05 LAB — BASIC METABOLIC PANEL
Anion gap: 6 (ref 5–15)
BUN: 17 mg/dL (ref 6–20)
CO2: 26 mmol/L (ref 22–32)
Calcium: 9.3 mg/dL (ref 8.9–10.3)
Chloride: 108 mmol/L (ref 101–111)
Creatinine, Ser: 1.1 mg/dL (ref 0.61–1.24)
GFR calc Af Amer: >60 mL/min (ref >60)
GFR calc non Af Amer: >60 mL/min (ref >60)
Glucose, Bld: 146 mg/dL — ABNORMAL HIGH (ref 65–99)
Potassium: 4.6 mmol/L (ref 3.5–5.1)
Sodium: 140 mmol/L (ref 135–145)

## 2016-01-05 LAB — CBC
HCT: 36.5 % — ABNORMAL LOW (ref 39.0–52.0)
Hemoglobin: 11.9 g/dL — ABNORMAL LOW (ref 13.0–17.0)
MCH: 29 pg (ref 26.0–34.0)
MCHC: 32.6 g/dL (ref 30.0–36.0)
MCV: 88.8 fL (ref 78.0–100.0)
Platelets: 191 10*3/uL (ref 150–400)
RBC: 4.11 MIL/uL — ABNORMAL LOW (ref 4.22–5.81)
RDW: 13.6 % (ref 11.5–15.5)
WBC: 6.3 10*3/uL (ref 4.0–10.5)

## 2016-01-05 LAB — SURGICAL PCR SCREEN
MRSA, PCR: NEGATIVE
Staphylococcus aureus: NEGATIVE

## 2016-01-05 LAB — GLUCOSE, CAPILLARY: Glucose-Capillary: 154 mg/dL — ABNORMAL HIGH (ref 65–99)

## 2016-01-05 NOTE — Progress Notes (Addendum)
EKG-12/10/15- EPIC  Clearance- Dr Marzetta Board burns on chart -12/10/15

## 2016-01-06 LAB — HEMOGLOBIN A1C
Hgb A1c MFr Bld: 6.2 % — ABNORMAL HIGH (ref 4.8–5.6)
Mean Plasma Glucose: 131 mg/dL

## 2016-01-14 ENCOUNTER — Inpatient Hospital Stay (HOSPITAL_COMMUNITY): Payer: Medicare Other

## 2016-01-14 ENCOUNTER — Encounter (HOSPITAL_COMMUNITY): Payer: Self-pay | Admitting: *Deleted

## 2016-01-14 ENCOUNTER — Inpatient Hospital Stay (HOSPITAL_COMMUNITY): Payer: Medicare Other | Admitting: Certified Registered Nurse Anesthetist

## 2016-01-14 ENCOUNTER — Inpatient Hospital Stay (HOSPITAL_COMMUNITY)
Admission: RE | Admit: 2016-01-14 | Discharge: 2016-01-15 | DRG: 468 | Disposition: A | Discharge status: Home Health | Payer: Medicare Other | Source: Ambulatory Visit | Attending: Orthopedic Surgery | Admitting: Orthopedic Surgery

## 2016-01-14 ENCOUNTER — Encounter (HOSPITAL_COMMUNITY)
Admission: RE | Disposition: A | Discharge status: Home Health | Payer: Self-pay | Source: Ambulatory Visit | Attending: Orthopedic Surgery

## 2016-01-14 DIAGNOSIS — R937 Abnormal findings on diagnostic imaging of other parts of musculoskeletal system: Secondary | ICD-10-CM | POA: Diagnosis not present

## 2016-01-14 DIAGNOSIS — Y792 Prosthetic and other implants, materials and accessory orthopedic devices associated with adverse incidents: Secondary | ICD-10-CM | POA: Diagnosis present

## 2016-01-14 DIAGNOSIS — G4730 Sleep apnea, unspecified: Secondary | ICD-10-CM | POA: Diagnosis not present

## 2016-01-14 DIAGNOSIS — G4733 Obstructive sleep apnea (adult) (pediatric): Secondary | ICD-10-CM | POA: Diagnosis not present

## 2016-01-14 DIAGNOSIS — E785 Hyperlipidemia, unspecified: Secondary | ICD-10-CM | POA: Diagnosis not present

## 2016-01-14 DIAGNOSIS — Z96642 Presence of left artificial hip joint: Secondary | ICD-10-CM | POA: Diagnosis not present

## 2016-01-14 DIAGNOSIS — E119 Type 2 diabetes mellitus without complications: Secondary | ICD-10-CM | POA: Diagnosis present

## 2016-01-14 DIAGNOSIS — M109 Gout, unspecified: Secondary | ICD-10-CM | POA: Diagnosis present

## 2016-01-14 DIAGNOSIS — Z96649 Presence of unspecified artificial hip joint: Secondary | ICD-10-CM

## 2016-01-14 DIAGNOSIS — T84061A Wear of articular bearing surface of internal prosthetic left hip joint, initial encounter: Secondary | ICD-10-CM | POA: Diagnosis not present

## 2016-01-14 DIAGNOSIS — Z09 Encounter for follow-up examination after completed treatment for conditions other than malignant neoplasm: Secondary | ICD-10-CM

## 2016-01-14 DIAGNOSIS — Z8551 Personal history of malignant neoplasm of bladder: Secondary | ICD-10-CM

## 2016-01-14 DIAGNOSIS — E139 Other specified diabetes mellitus without complications: Secondary | ICD-10-CM | POA: Diagnosis not present

## 2016-01-14 DIAGNOSIS — Z87442 Personal history of urinary calculi: Secondary | ICD-10-CM | POA: Diagnosis not present

## 2016-01-14 DIAGNOSIS — I10 Essential (primary) hypertension: Secondary | ICD-10-CM | POA: Diagnosis not present

## 2016-01-14 DIAGNOSIS — M25552 Pain in left hip: Secondary | ICD-10-CM

## 2016-01-14 DIAGNOSIS — M1612 Unilateral primary osteoarthritis, left hip: Secondary | ICD-10-CM | POA: Diagnosis not present

## 2016-01-14 DIAGNOSIS — Z471 Aftercare following joint replacement surgery: Secondary | ICD-10-CM | POA: Diagnosis not present

## 2016-01-14 DIAGNOSIS — T84018A Broken internal joint prosthesis, other site, initial encounter: Secondary | ICD-10-CM

## 2016-01-14 DIAGNOSIS — R1030 Lower abdominal pain, unspecified: Secondary | ICD-10-CM | POA: Diagnosis not present

## 2016-01-14 DIAGNOSIS — T84195A Other mechanical complication of internal fixation device of left femur, initial encounter: Secondary | ICD-10-CM | POA: Diagnosis not present

## 2016-01-14 DIAGNOSIS — T84091A Other mechanical complication of internal left hip prosthesis, initial encounter: Secondary | ICD-10-CM | POA: Diagnosis not present

## 2016-01-14 DIAGNOSIS — Z85820 Personal history of malignant melanoma of skin: Secondary | ICD-10-CM | POA: Diagnosis not present

## 2016-01-14 HISTORY — PX: ANTERIOR HIP REVISION: SHX6527

## 2016-01-14 HISTORY — DX: Presence of left artificial hip joint: Z96.642

## 2016-01-14 LAB — TYPE AND SCREEN
ABO/RH(D): B POS
Antibody Screen: NEGATIVE

## 2016-01-14 LAB — GLUCOSE, CAPILLARY
Glucose-Capillary: 111 mg/dL — ABNORMAL HIGH (ref 65–99)
Glucose-Capillary: 119 mg/dL — ABNORMAL HIGH (ref 65–99)
Glucose-Capillary: 101 mg/dL — ABNORMAL HIGH (ref 65–99)
Glucose-Capillary: 141 mg/dL — ABNORMAL HIGH (ref 65–99)

## 2016-01-14 SURGERY — REVISION, TOTAL ARTHROPLASTY, HIP, ANTERIOR APPROACH
Anesthesia: Spinal | Site: Hip | Laterality: Left

## 2016-01-14 MED ORDER — DIPHENHYDRAMINE HCL 12.5 MG/5ML PO ELIX: 12.5000 mg | ORAL_SOLUTION | ORAL | Status: DC | PRN

## 2016-01-14 MED ORDER — CHLORHEXIDINE GLUCONATE 4 % EX LIQD: 60.0000 mL | Freq: Once | CUTANEOUS | Status: DC

## 2016-01-14 MED ORDER — DEXAMETHASONE SODIUM PHOSPHATE 10 MG/ML IJ SOLN
10.0000 mg | Freq: Once | INTRAMUSCULAR | Status: AC
  Administered 2016-01-15: 10 mg via INTRAVENOUS
  Filled 2016-01-14: qty 1

## 2016-01-14 MED ORDER — HYDROGEN PEROXIDE 3 % EX SOLN
CUTANEOUS | Status: AC
  Filled 2016-01-14: qty 473

## 2016-01-14 MED ORDER — EPHEDRINE 5 MG/ML INJ
INTRAVENOUS | Status: AC
  Filled 2016-01-14: qty 10

## 2016-01-14 MED ORDER — CARVEDILOL 25 MG PO TABS
25.0000 mg | ORAL_TABLET | Freq: Two times a day (BID) | ORAL | Status: DC
  Administered 2016-01-14 – 2016-01-15 (×2): 25 mg via ORAL
  Filled 2016-01-14 (×2): qty 1

## 2016-01-14 MED ORDER — ASPIRIN 81 MG PO CHEW
81.0000 mg | CHEWABLE_TABLET | Freq: Two times a day (BID) | ORAL | Status: DC
  Administered 2016-01-14 – 2016-01-15 (×2): 81 mg via ORAL
  Filled 2016-01-14 (×2): qty 1

## 2016-01-14 MED ORDER — AMLODIPINE BESYLATE 5 MG PO TABS
5.0000 mg | ORAL_TABLET | Freq: Every day | ORAL | Status: DC
  Administered 2016-01-14: 5 mg via ORAL
  Filled 2016-01-14: qty 1

## 2016-01-14 MED ORDER — HYDROMORPHONE HCL 1 MG/ML IJ SOLN
0.5000 mg | INTRAMUSCULAR | Status: DC | PRN
  Administered 2016-01-14: 0.5 mg via INTRAVENOUS
  Filled 2016-01-14: qty 1

## 2016-01-14 MED ORDER — CEFAZOLIN SODIUM-DEXTROSE 2-4 GM/100ML-% IV SOLN
2.0000 g | INTRAVENOUS | Status: AC
  Administered 2016-01-14: 2 g via INTRAVENOUS
  Filled 2016-01-14: qty 100

## 2016-01-14 MED ORDER — SUGAMMADEX SODIUM 200 MG/2ML IV SOLN
INTRAVENOUS | Status: AC
  Filled 2016-01-14: qty 2

## 2016-01-14 MED ORDER — PROPOFOL 10 MG/ML IV BOLUS
INTRAVENOUS | Status: DC | PRN
  Administered 2016-01-14: 150 mg via INTRAVENOUS

## 2016-01-14 MED ORDER — KETOROLAC TROMETHAMINE 15 MG/ML IJ SOLN
7.5000 mg | Freq: Four times a day (QID) | INTRAMUSCULAR | Status: DC
  Administered 2016-01-14 – 2016-01-15 (×3): 7.5 mg via INTRAVENOUS
  Filled 2016-01-14 (×3): qty 1

## 2016-01-14 MED ORDER — ROCURONIUM BROMIDE 50 MG/5ML IV SOSY
PREFILLED_SYRINGE | INTRAVENOUS | Status: AC
  Filled 2016-01-14: qty 5

## 2016-01-14 MED ORDER — TRANEXAMIC ACID 1000 MG/10ML IV SOLN
1000.0000 mg | Freq: Once | INTRAVENOUS | Status: AC
  Administered 2016-01-14: 1000 mg via INTRAVENOUS
  Filled 2016-01-14: qty 1100

## 2016-01-14 MED ORDER — ISOPROPYL ALCOHOL 70 % SOLN
Status: DC | PRN
  Administered 2016-01-14: 1 via TOPICAL

## 2016-01-14 MED ORDER — BUPIVACAINE-EPINEPHRINE 0.25% -1:200000 IJ SOLN
INTRAMUSCULAR | Status: DC | PRN
  Administered 2016-01-14: 30 mL

## 2016-01-14 MED ORDER — KETOROLAC TROMETHAMINE 30 MG/ML IJ SOLN
INTRAMUSCULAR | Status: AC
  Filled 2016-01-14: qty 1

## 2016-01-14 MED ORDER — SUGAMMADEX SODIUM 500 MG/5ML IV SOLN
INTRAVENOUS | Status: DC | PRN
  Administered 2016-01-14: 300 mg via INTRAVENOUS

## 2016-01-14 MED ORDER — SENNA 8.6 MG PO TABS: 2.0000 | ORAL_TABLET | Freq: Every day | ORAL | 3 refills | Status: DC

## 2016-01-14 MED ORDER — MIDAZOLAM HCL 5 MG/5ML IJ SOLN
INTRAMUSCULAR | Status: DC | PRN
  Administered 2016-01-14: 0.5 mg via INTRAVENOUS
  Administered 2016-01-14: 1 mg via INTRAVENOUS
  Administered 2016-01-14: 0.5 mg via INTRAVENOUS

## 2016-01-14 MED ORDER — SODIUM CHLORIDE 0.9 % IJ SOLN
INTRAMUSCULAR | Status: AC
  Filled 2016-01-14: qty 50

## 2016-01-14 MED ORDER — SODIUM CHLORIDE 0.9 % IV SOLN
INTRAVENOUS | Status: DC
  Administered 2016-01-14 (×2): via INTRAVENOUS

## 2016-01-14 MED ORDER — KETOROLAC TROMETHAMINE 30 MG/ML IJ SOLN
INTRAMUSCULAR | Status: DC | PRN
  Administered 2016-01-14: 30 mg

## 2016-01-14 MED ORDER — MIDAZOLAM HCL 2 MG/2ML IJ SOLN
INTRAMUSCULAR | Status: AC
  Filled 2016-01-14: qty 2

## 2016-01-14 MED ORDER — POLYETHYLENE GLYCOL 3350 17 G PO PACK: 17.0000 g | PACK | Freq: Every day | ORAL | Status: DC | PRN

## 2016-01-14 MED ORDER — LIDOCAINE HCL (CARDIAC) 20 MG/ML IV SOLN
INTRAVENOUS | Status: DC | PRN
  Administered 2016-01-14: 80 mg via INTRAVENOUS

## 2016-01-14 MED ORDER — HYDROCODONE-ACETAMINOPHEN 5-325 MG PO TABS
1.0000 | ORAL_TABLET | ORAL | Status: DC | PRN
  Administered 2016-01-14: 1 via ORAL
  Administered 2016-01-14: 2 via ORAL
  Administered 2016-01-14: 1 via ORAL
  Administered 2016-01-15: 2 via ORAL
  Filled 2016-01-14 (×2): qty 2
  Filled 2016-01-14 (×2): qty 1

## 2016-01-14 MED ORDER — MENTHOL 3 MG MT LOZG: 1.0000 | LOZENGE | OROMUCOSAL | Status: DC | PRN

## 2016-01-14 MED ORDER — ONDANSETRON HCL 4 MG/2ML IJ SOLN
INTRAMUSCULAR | Status: DC | PRN
  Administered 2016-01-14: 4 mg via INTRAVENOUS

## 2016-01-14 MED ORDER — HYDROCODONE-ACETAMINOPHEN 5-325 MG PO TABS: 1.0000 | ORAL_TABLET | ORAL | 0 refills | Status: DC | PRN

## 2016-01-14 MED ORDER — HYDROMORPHONE HCL 1 MG/ML IJ SOLN
INTRAMUSCULAR | Status: AC
  Filled 2016-01-14: qty 1

## 2016-01-14 MED ORDER — BUPIVACAINE HCL (PF) 0.25 % IJ SOLN
INTRAMUSCULAR | Status: AC
  Filled 2016-01-14: qty 30

## 2016-01-14 MED ORDER — ACETAMINOPHEN 650 MG RE SUPP: 650.0000 mg | Freq: Four times a day (QID) | RECTAL | Status: DC | PRN

## 2016-01-14 MED ORDER — WATER FOR IRRIGATION, STERILE IR SOLN
Status: DC | PRN
  Administered 2016-01-14: 2000 mL

## 2016-01-14 MED ORDER — METOCLOPRAMIDE HCL 5 MG/ML IJ SOLN: 5.0000 mg | Freq: Three times a day (TID) | INTRAMUSCULAR | Status: DC | PRN

## 2016-01-14 MED ORDER — METFORMIN HCL 500 MG PO TABS
500.0000 mg | ORAL_TABLET | Freq: Two times a day (BID) | ORAL | Status: DC
  Administered 2016-01-14 – 2016-01-15 (×2): 500 mg via ORAL
  Filled 2016-01-14 (×2): qty 1

## 2016-01-14 MED ORDER — ONDANSETRON HCL 4 MG/2ML IJ SOLN: 4.0000 mg | Freq: Four times a day (QID) | INTRAMUSCULAR | Status: DC | PRN

## 2016-01-14 MED ORDER — PROPOFOL 10 MG/ML IV BOLUS
INTRAVENOUS | Status: AC
  Filled 2016-01-14: qty 40

## 2016-01-14 MED ORDER — FENTANYL CITRATE (PF) 100 MCG/2ML IJ SOLN
INTRAMUSCULAR | Status: AC
  Filled 2016-01-14: qty 2

## 2016-01-14 MED ORDER — METOCLOPRAMIDE HCL 5 MG PO TABS: 5.0000 mg | ORAL_TABLET | Freq: Three times a day (TID) | ORAL | Status: DC | PRN

## 2016-01-14 MED ORDER — TRANEXAMIC ACID 1000 MG/10ML IV SOLN
1000.0000 mg | INTRAVENOUS | Status: AC
  Administered 2016-01-14: 1000 mg via INTRAVENOUS
  Filled 2016-01-14: qty 1100

## 2016-01-14 MED ORDER — DOCUSATE SODIUM 100 MG PO CAPS
100.0000 mg | ORAL_CAPSULE | Freq: Two times a day (BID) | ORAL | Status: DC
  Administered 2016-01-14 – 2016-01-15 (×2): 100 mg via ORAL
  Filled 2016-01-14 (×2): qty 1

## 2016-01-14 MED ORDER — FENTANYL CITRATE (PF) 100 MCG/2ML IJ SOLN
INTRAMUSCULAR | Status: DC | PRN
  Administered 2016-01-14 (×2): 50 ug via INTRAVENOUS
  Administered 2016-01-14: 25 ug via INTRAVENOUS
  Administered 2016-01-14: 150 ug via INTRAVENOUS
  Administered 2016-01-14: 25 ug via INTRAVENOUS

## 2016-01-14 MED ORDER — ASPIRIN 81 MG PO TABS: 81.0000 mg | ORAL_TABLET | Freq: Two times a day (BID) | ORAL | 1 refills | Status: AC

## 2016-01-14 MED ORDER — LACTATED RINGERS IV SOLN
INTRAVENOUS | Status: DC | PRN
  Administered 2016-01-14 (×2): via INTRAVENOUS

## 2016-01-14 MED ORDER — METHOCARBAMOL 500 MG PO TABS: 500.0000 mg | ORAL_TABLET | Freq: Four times a day (QID) | ORAL | Status: DC | PRN

## 2016-01-14 MED ORDER — ISOPROPYL ALCOHOL 70 % SOLN
Status: AC
  Filled 2016-01-14: qty 480

## 2016-01-14 MED ORDER — BETAXOLOL HCL 0.25 % OP SUSP
1.0000 [drp] | Freq: Two times a day (BID) | OPHTHALMIC | Status: DC
  Administered 2016-01-14 – 2016-01-15 (×2): 1 [drp] via OPHTHALMIC
  Filled 2016-01-14: qty 10

## 2016-01-14 MED ORDER — ONDANSETRON HCL 4 MG/2ML IJ SOLN
INTRAMUSCULAR | Status: AC
  Filled 2016-01-14: qty 2

## 2016-01-14 MED ORDER — PIOGLITAZONE HCL 15 MG PO TABS
15.0000 mg | ORAL_TABLET | Freq: Every evening | ORAL | Status: DC
  Administered 2016-01-14: 15 mg via ORAL
  Filled 2016-01-14: qty 1

## 2016-01-14 MED ORDER — MEPERIDINE HCL 50 MG/ML IJ SOLN: 6.2500 mg | INTRAMUSCULAR | Status: DC | PRN

## 2016-01-14 MED ORDER — DOCUSATE SODIUM 100 MG PO CAPS: 100.0000 mg | ORAL_CAPSULE | Freq: Two times a day (BID) | ORAL | 3 refills | Status: AC

## 2016-01-14 MED ORDER — ACETAMINOPHEN 10 MG/ML IV SOLN
1000.0000 mg | INTRAVENOUS | Status: DC
  Filled 2016-01-14: qty 100

## 2016-01-14 MED ORDER — SENNA 8.6 MG PO TABS
2.0000 | ORAL_TABLET | Freq: Every day | ORAL | Status: DC
  Administered 2016-01-14: 17.2 mg via ORAL
  Filled 2016-01-14: qty 2

## 2016-01-14 MED ORDER — ROCURONIUM BROMIDE 100 MG/10ML IV SOLN
INTRAVENOUS | Status: DC | PRN
  Administered 2016-01-14 (×2): 10 mg via INTRAVENOUS
  Administered 2016-01-14: 50 mg via INTRAVENOUS

## 2016-01-14 MED ORDER — ONDANSETRON HCL 4 MG PO TABS: 4.0000 mg | ORAL_TABLET | Freq: Four times a day (QID) | ORAL | Status: DC | PRN

## 2016-01-14 MED ORDER — ONDANSETRON HCL 4 MG/2ML IJ SOLN: 4.0000 mg | Freq: Once | INTRAMUSCULAR | Status: DC | PRN

## 2016-01-14 MED ORDER — PRAVASTATIN SODIUM 20 MG PO TABS
40.0000 mg | ORAL_TABLET | Freq: Every evening | ORAL | Status: DC
  Administered 2016-01-14: 40 mg via ORAL
  Filled 2016-01-14: qty 2

## 2016-01-14 MED ORDER — METHOCARBAMOL 1000 MG/10ML IJ SOLN
500.0000 mg | Freq: Four times a day (QID) | INTRAVENOUS | Status: DC | PRN
  Administered 2016-01-14: 500 mg via INTRAVENOUS
  Filled 2016-01-14: qty 5
  Filled 2016-01-14: qty 550

## 2016-01-14 MED ORDER — ACETAMINOPHEN 325 MG PO TABS: 650.0000 mg | ORAL_TABLET | Freq: Four times a day (QID) | ORAL | Status: DC | PRN

## 2016-01-14 MED ORDER — CEFAZOLIN SODIUM-DEXTROSE 2-4 GM/100ML-% IV SOLN
INTRAVENOUS | Status: AC
  Filled 2016-01-14: qty 100

## 2016-01-14 MED ORDER — POVIDONE-IODINE 10 % EX SWAB: 2.0000 "application " | Freq: Once | CUTANEOUS | Status: DC

## 2016-01-14 MED ORDER — EPHEDRINE SULFATE 50 MG/ML IJ SOLN
INTRAMUSCULAR | Status: DC | PRN
  Administered 2016-01-14: 10 mg via INTRAVENOUS

## 2016-01-14 MED ORDER — SODIUM CHLORIDE 0.9 % IR SOLN
Status: DC | PRN
  Administered 2016-01-14: 1000 mL

## 2016-01-14 MED ORDER — SODIUM CHLORIDE 0.9 % IV SOLN: INTRAVENOUS | Status: DC

## 2016-01-14 MED ORDER — DEXAMETHASONE SODIUM PHOSPHATE 10 MG/ML IJ SOLN
INTRAMUSCULAR | Status: AC
  Filled 2016-01-14: qty 1

## 2016-01-14 MED ORDER — LISINOPRIL 20 MG PO TABS
20.0000 mg | ORAL_TABLET | Freq: Two times a day (BID) | ORAL | Status: DC
  Administered 2016-01-14 – 2016-01-15 (×2): 20 mg via ORAL
  Filled 2016-01-14 (×2): qty 1

## 2016-01-14 MED ORDER — ROCURONIUM BROMIDE 50 MG/5ML IV SOSY
PREFILLED_SYRINGE | INTRAVENOUS | Status: AC
  Filled 2016-01-14: qty 10

## 2016-01-14 MED ORDER — ACETAMINOPHEN 10 MG/ML IV SOLN
INTRAVENOUS | Status: AC
  Filled 2016-01-14: qty 100

## 2016-01-14 MED ORDER — ACETAMINOPHEN 10 MG/ML IV SOLN
INTRAVENOUS | Status: DC | PRN
  Administered 2016-01-14: 1000 mg via INTRAVENOUS

## 2016-01-14 MED ORDER — ONDANSETRON HCL 4 MG PO TABS: 4.0000 mg | ORAL_TABLET | Freq: Three times a day (TID) | ORAL | 0 refills | Status: DC | PRN

## 2016-01-14 MED ORDER — VITAMIN D3 25 MCG (1000 UNIT) PO TABS
1000.0000 [IU] | ORAL_TABLET | Freq: Every day | ORAL | Status: DC
  Administered 2016-01-14 – 2016-01-15 (×2): 1000 [IU] via ORAL
  Filled 2016-01-14 (×2): qty 1

## 2016-01-14 MED ORDER — CEFAZOLIN SODIUM-DEXTROSE 2-4 GM/100ML-% IV SOLN
2.0000 g | Freq: Four times a day (QID) | INTRAVENOUS | Status: AC
  Administered 2016-01-14 (×2): 2 g via INTRAVENOUS
  Filled 2016-01-14 (×2): qty 100

## 2016-01-14 MED ORDER — HYDROMORPHONE HCL 1 MG/ML IJ SOLN
0.2500 mg | INTRAMUSCULAR | Status: DC | PRN
  Administered 2016-01-14 (×2): 0.5 mg via INTRAVENOUS

## 2016-01-14 MED ORDER — SODIUM CHLORIDE 0.9 % IJ SOLN
INTRAMUSCULAR | Status: DC | PRN
  Administered 2016-01-14: 30 mL

## 2016-01-14 MED ORDER — PHENOL 1.4 % MT LIQD
1.0000 | OROMUCOSAL | Status: DC | PRN
Start: 2016-01-14 — End: 2016-01-15
  Filled 2016-01-14: qty 177

## 2016-01-14 SURGICAL SUPPLY — 55 items
ADH SKN CLS APL DERMABOND .7 (GAUZE/BANDAGES/DRESSINGS) ×2
BAG DECANTER FOR FLEXI CONT (MISCELLANEOUS) IMPLANT
BAG SPEC THK2 15X12 ZIP CLS (MISCELLANEOUS)
BAG ZIPLOCK 12X15 (MISCELLANEOUS) IMPLANT
BIT DRILL RINGLOC QUICK CONN (BIT) IMPLANT
CHLORAPREP W/TINT 26ML (MISCELLANEOUS) ×2 IMPLANT
CLOTH BEACON ORANGE TIMEOUT ST (SAFETY) ×2 IMPLANT
COVER PERINEAL POST (MISCELLANEOUS) ×2 IMPLANT
DECANTER SPIKE VIAL GLASS SM (MISCELLANEOUS) ×2 IMPLANT
DERMABOND ADVANCED (GAUZE/BANDAGES/DRESSINGS) ×2
DERMABOND ADVANCED .7 DNX12 (GAUZE/BANDAGES/DRESSINGS) ×2 IMPLANT
DRAPE SHEET LG 3/4 BI-LAMINATE (DRAPES) ×4 IMPLANT
DRAPE STERI IOBAN 125X83 (DRAPES) ×2 IMPLANT
DRAPE U-SHAPE 47X51 STRL (DRAPES) ×4 IMPLANT
DRILL BIT RINGLOC QUICK CONN (BIT) ×2
DRSG AQUACEL AG ADV 3.5X10 (GAUZE/BANDAGES/DRESSINGS) ×2 IMPLANT
ELECT PENCIL ROCKER SW 15FT (MISCELLANEOUS) ×2 IMPLANT
ELECT REM PT RETURN 15FT ADLT (MISCELLANEOUS) ×2 IMPLANT
GAUZE SPONGE 4X4 12PLY STRL (GAUZE/BANDAGES/DRESSINGS) ×2 IMPLANT
GLOVE BIO SURGEON STRL SZ8.5 (GLOVE) ×4 IMPLANT
GLOVE BIOGEL M STRL SZ7.5 (GLOVE) ×1 IMPLANT
GLOVE BIOGEL PI IND STRL 7.0 (GLOVE) IMPLANT
GLOVE BIOGEL PI IND STRL 8.5 (GLOVE) ×1 IMPLANT
GLOVE BIOGEL PI INDICATOR 7.0 (GLOVE) ×1
GLOVE BIOGEL PI INDICATOR 8.5 (GLOVE) ×1
GOWN SPEC L3 XXLG W/TWL (GOWN DISPOSABLE) ×2 IMPLANT
HANDPIECE INTERPULSE COAX TIP (DISPOSABLE) ×2
HEAD FEMUR METAL 36MM 15.6 HIP (Head) IMPLANT
HIP SHELL ACETAB 3H 56MM F (Shell) ×2 IMPLANT
HOLDER FOLEY CATH W/STRAP (MISCELLANEOUS) ×1 IMPLANT
HOOD PEEL AWAY FLYTE STAYCOOL (MISCELLANEOUS) ×4 IMPLANT
LINER ACE +5MM 36MM HIP (Liner) ×1 IMPLANT
MARKER SKIN DUAL TIP RULER LAB (MISCELLANEOUS) ×2 IMPLANT
METAL FEMUR HEAD 36MM 15.6 HIP (Head) ×2 IMPLANT
NDL SPNL 18GX3.5 QUINCKE PK (NEEDLE) ×1 IMPLANT
NEEDLE SPNL 18GX3.5 QUINCKE PK (NEEDLE) ×2 IMPLANT
PACK ANTERIOR HIP CUSTOM (KITS) ×2 IMPLANT
SAW OSC TIP CART 19.5X105X1.3 (SAW) ×2 IMPLANT
SCREW ACETABULAR G7 6.5X30 (Screw) ×1 IMPLANT
SEALER BIPOLAR AQUA 6.0 (INSTRUMENTS) ×2 IMPLANT
SET HNDPC FAN SPRY TIP SCT (DISPOSABLE) ×1 IMPLANT
SHELL ACETAB HIP 3H 56MM F (Shell) ×1 IMPLANT
SOL PREP POV-IOD 4OZ 10% (MISCELLANEOUS) ×2 IMPLANT
SUT ETHIBOND NAB CT1 #1 30IN (SUTURE) ×4 IMPLANT
SUT MNCRL AB 3-0 PS2 18 (SUTURE) ×2 IMPLANT
SUT MON AB 2-0 CT1 36 (SUTURE) ×4 IMPLANT
SUT STRATAFIX PDO 1 14 VIOLET (SUTURE) ×2
SUT STRATFX PDO 1 14 VIOLET (SUTURE) ×1
SUT VIC AB 2-0 CT1 27 (SUTURE) ×2
SUT VIC AB 2-0 CT1 TAPERPNT 27 (SUTURE) ×1 IMPLANT
SUTURE STRATFX PDO 1 14 VIOLET (SUTURE) ×1 IMPLANT
SYR 50ML LL SCALE MARK (SYRINGE) ×2 IMPLANT
TRAY FOLEY W/METER SILVER 16FR (SET/KITS/TRAYS/PACK) IMPLANT
WATER STERILE IRR 1500ML POUR (IV SOLUTION) ×2 IMPLANT
YANKAUER SUCT BULB TIP 10FT TU (MISCELLANEOUS) ×2 IMPLANT

## 2016-01-14 NOTE — Brief Op Note (Signed)
01/14/2016  9:46 AM  PATIENT:  Donald Carlson  70 y.o. male  PRE-OPERATIVE DIAGNOSIS:  Failed Left Hemi Hip  POST-OPERATIVE DIAGNOSIS:  Failed Left Hemi Hip  PROCEDURE:  Procedure(s): CONVERSION OF LEFT HEMI HIP TO LEFT TOTAL HIP ARTHROPLASTY ANTERIOR APPROACH (Left)  SURGEON:  Surgeon(s) and Role:    * Rod Can, MD - Primary  PHYSICIAN ASSISTANT: blair roberts, pa-c  ASSISTANTS: none   ANESTHESIA:   general  EBL:  Total I/O In: 1000 [I.V.:1000] Out: 225 [Blood:225]  BLOOD ADMINISTERED:none  DRAINS: none   LOCAL MEDICATIONS USED:  [place]     SPECIMEN:  No Specimen  DISPOSITION OF SPECIMEN:  N/A  COUNTS:  YES  TOURNIQUET:  * No tourniquets in log *  DICTATION: .Other Dictation: Dictation Number [id number]  PLAN OF CARE: Admit to inpatient   PATIENT DISPOSITION:  PACU - hemodynamically stable.   Delay start of Pharmacological VTE agent (>24hrs) due to surgical blood loss or risk of bleeding: no

## 2016-01-14 NOTE — H&P (View-Only) (Signed)
TOTAL HIP REVISION ADMISSION H&P  Patient is admitted for left revision total hip arthroplasty.  Subjective:  Chief Complaint: left hip pain  HPI: Donald Carlson, 70 y.o. male, has a history of pain and functional disability in the left hip due to arthritis and patient has failed non-surgical conservative treatments for greater than 12 weeks to include NSAID's and/or analgesics, flexibility and strengthening excercises, use of assistive devices and activity modification. 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  Onset of symptoms was gradual starting 3 years ago with gradually worsening course since that time.  Prior procedures on the left hip include hemi-arthroplasty and hip pinning.  Patient currently rates pain in the left hip at 10 out of 10 with activity.  There is night pain, worsening of pain with activity and weight bearing and pain that interfers with activities of daily living. Patient has evidence of joint space narrowing by imaging studies.  This condition presents safety issues increasing the risk of falls.  This patient has had proximal femur fracture.  There is no current active infection.  Patient Active Problem List   Diagnosis Date Noted  . Preoperative clearance 12/10/2015  . Pain in the chest 12/10/2015  . Left leg pain 06/16/2015  . Trochanteric bursitis of left hip 04/23/2014  . OSA (obstructive sleep apnea) 04/20/2014  . Low serum vitamin B12 02/02/2014  . Bladder cancer ([place]) 11/21/2013  . Melanoma of skin, site unspecified 10/07/2013  . Closed left hip fracture ([place]) 06/18/2012  . Small bowel obstruction s/p open lysis of adhesions [id number] 08/23/2011  . 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 History of gout 03/27/2008  . CPK, ABNORMAL 10/23/2007  . Mixed hyperlipidemia 01/11/2007  . GLAUCOMA NEC 01/11/2007  . Essential hypertension 01/11/2007  . FEMORAL BRUIT  01/07/2007   Past Medical History:  Diagnosis Date  . Anemia   . Arthritis   . Bladder cancer ([place]) 2012   scrapped bladder and inserted liquid chemo  . Diabetes mellitus   . Gout   . HERNIORRHAPHY, HX OF 01/07/2007   Qualifier: Diagnosis of  By: Ronnald Ramp CMA, [place]    . Hyperlipidemia   . Hypertension   . Kidney stones   . Pneumonia   . Skin cancer (melanoma) ([place]) 1996   Left/ Back  . Small bowel obstruction s/p open lysis of adhesions [id number] 08/23/2011    Past Surgical History:  Procedure Laterality Date  . ABDOMINAL ADHESION SURGERY  08/23/2011   Procedure: EXPLORATORY LAPAROTOMY;  Surgeon: Edward Jolly, MD;  Location: [facility];  Service: General;  Laterality: N/A;  Lysis of Adhesions/small bowel obstruction  . 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 COLONOSCOPY  07/2002   neg  . EXCISION/RELEASE BURSA HIP Left 04/23/2014   Procedure: EXCISION OF LEFT HIP TROCHANTERIC BURSA;  Surgeon: Johnn Hai, MD;  Location: [facility];  Service: Orthopedics;  Laterality: Left;  . FRACTURE SURGERY    . HERNIA REPAIR    . HIP PINNING,CANNULATED Left 06/19/2012   Procedure: CANNULATED HIP PINNING;  Surgeon: Johnn Hai, MD;  Location: [facility];  Service: Orthopedics;  Laterality: Left;  . KNEE ARTHROSCOPY     Left knee  . LAPAROTOMY  08/23/2011   Procedure: EXPLORATORY LAPAROTOMY;  Surgeon: Edward Jolly, MD;  Location: [facility];  Service: General;  Laterality: N/A;  Lysis of Adhesions/small bowel obstruction  . LUMBAR LAMINECTOMY  2004  .  SHOULDER SURGERY  05/2008  . TOTAL HIP ARTHROPLASTY Left 01/30/2013   Procedure: REMOVAL OF HARDWARE, LEFT HIP HEMIARTHROPLASTY;  Surgeon: Johnn Hai, MD;  Location: [facility];  Service: Orthopedics;  Laterality: Left;     (Not in a hospital admission) No Known Allergies  Social History  Substance Use Topics  . Smoking status: Never Smoker  . Smokeless tobacco: Never Used  . Alcohol use No    Family History  Problem Relation Age  of Onset  . Diabetes Mother   . Heart attack Mother 53  . Heart disease Mother   . Heart attack Brother 75  . Lung cancer Brother   . Emphysema Brother   . Cancer Brother     lung  . Breast cancer Sister   . Cancer Sister     breast  . Cancer Brother     Lymph node/neck      Review of Systems  Constitutional: Negative.   HENT: Negative.   Eyes: Negative.   Respiratory: Negative.   Cardiovascular: Negative.   Gastrointestinal: Negative.   Genitourinary: Negative.   Musculoskeletal: Negative.   Skin: Negative.   Neurological: Negative.   Endo/Heme/Allergies: Negative.   Psychiatric/Behavioral: Negative.     Objective:  Physical Exam  Vitals reviewed. Constitutional: He is oriented to person, place, and time. He appears well-developed and well-nourished.  HENT:  Head: Normocephalic.  Eyes: Conjunctivae and EOM are normal. Pupils are equal, round, and reactive to light.  Neck: Normal range of motion. Neck supple.  Cardiovascular: Normal rate.   Respiratory: No respiratory distress.  GI: Soft. He exhibits no distension.  Genitourinary:  Genitourinary Comments: deferred  Musculoskeletal:       Legs: Healed posterior incision. Pain with ROM And WB.  Neurological: He is alert and oriented to person, place, and time.  Skin: Skin is warm and dry.  Psychiatric: He has a normal mood and affect. His behavior is normal. Judgment and thought content normal.    Vital signs in last 24 hours: @VSRANGES @   Labs:   Estimated body mass index is 28.55 kg/m as calculated from the following:   Height as of 11/27/14: 5\' 10"  (1.778 m).   Weight as of 12/10/15: 90.3 kg (199 lb).  Imaging Review:  Plain radiographs demonstrate severe degenerative joint disease of the left hip(s). There is no loosening of the femoral component.The bone quality appears to be adequate for age and reported activity level.  Assessment/Plan:  End stage arthritis, left hip(s) with failed previous  hemiarthroplasty.  The patient history, physical examination, clinical judgement of the provider and imaging studies are consistent with end stage degenerative joint disease of the left hip(s), previous total hip arthroplasty. Revision total hip arthroplasty is deemed medically necessary. The treatment options including medical management, injection therapy, arthroscopy and arthroplasty were discussed at length. The risks and benefits of total hip arthroplasty were presented and reviewed. The risks due to aseptic loosening, infection, stiffness, dislocation/subluxation,  thromboembolic complications and other imponderables were discussed.  The patient acknowledged the explanation, agreed to proceed with the plan and consent was signed. Patient is being admitted for inpatient treatment for surgery, pain control, PT, OT, prophylactic antibiotics, VTE prophylaxis, progressive ambulation and ADL's and discharge planning. The patient is planning to be discharged home with home health services
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
## 2016-01-14 NOTE — Anesthesia Procedure Notes (Signed)
Procedure Name: Intubation Date/Time: 01/14/2016 7:32 AM Performed by: Lillia Abed Pre-anesthesia Checklist: Patient identified, Timeout performed, Emergency Drugs available, Suction available and Patient being monitored Patient Re-evaluated:Patient Re-evaluated prior to inductionOxygen Delivery Method: Circle system utilized Preoxygenation: Pre-oxygenation with 100% oxygen Intubation Type: IV induction Ventilation: Mask ventilation without difficulty Laryngoscope Size: Mac and 4 Grade View: Grade II Tube type: Oral Tube size: 7.5 mm Number of attempts: 1 Airway Equipment and Method: Stylet Secured at: 22 cm Tube secured with: Tape Dental Injury: Teeth and Oropharynx as per pre-operative assessment  Comments: Slightly anterior. Cords vis. With head lift

## 2016-01-14 NOTE — Anesthesia Postprocedure Evaluation (Signed)
Anesthesia Post Note  Patient: Donald Carlson  Procedure(s) Performed: Procedure(s) (LRB): CONVERSION OF LEFT HEMI HIP TO LEFT TOTAL HIP ARTHROPLASTY ANTERIOR APPROACH (Left)  Patient location during evaluation: PACU Anesthesia Type: Spinal Level of consciousness: oriented and awake and alert Pain management: pain level controlled Vital Signs Assessment: post-procedure vital signs reviewed and stable Respiratory status: spontaneous breathing, respiratory function stable and patient connected to nasal cannula oxygen Cardiovascular status: blood pressure returned to baseline and stable Postop Assessment: no headache and no backache Anesthetic complications: no    Last Vitals:  Vitals:   01/14/16 1140 01/14/16 1259  BP: (!) 160/85 137/71  Pulse:  63  Resp: 15 16  Temp: 36.6 C 36.7 C    Last Pain:  Vitals:   01/14/16 1259  TempSrc: Oral  PainSc:                  Kyleigha Markert DAVID

## 2016-01-14 NOTE — Interval H&P Note (Signed)
History and Physical Interval Note:  01/14/2016 6:55 AM  Donald Carlson  has presented today for surgery, with the diagnosis of Failed Left Hemi Hip  The various methods of treatment have been discussed with the patient and family. After consideration of risks, benefits and other options for treatment, the patient has consented to  Procedure(s): CONVERSION OF LEFT HEMI HIP TO LEFT TOTAL HIP ARTHROPLASTY ANTERIOR APPROACH (Left) as a surgical intervention .  The patient's history has been reviewed, patient examined, no change in status, stable for surgery.  I have reviewed the patient's chart and labs.  Questions were answered to the patient's satisfaction.     Previn Jian, Horald Pollen

## 2016-01-14 NOTE — Anesthesia Preprocedure Evaluation (Signed)
Anesthesia Evaluation  Patient identified by MRN, date of birth, ID band Patient awake    Reviewed: Allergy & Precautions, NPO status , Patient's Chart, lab work & pertinent test results  Airway Mallampati: I  TM Distance: >3 FB Neck ROM: Full    Dental   Pulmonary sleep apnea ,    Pulmonary exam normal        Cardiovascular hypertension, Pt. on medications Normal cardiovascular exam     Neuro/Psych    GI/Hepatic   Endo/Other  diabetes  Renal/GU      Musculoskeletal   Abdominal   Peds  Hematology   Anesthesia Other Findings   Reproductive/Obstetrics                             Anesthesia Physical Anesthesia Plan  ASA: III  Anesthesia Plan: Spinal   Post-op Pain Management:    Induction: Intravenous  Airway Management Planned: Simple Face Mask  Additional Equipment:   Intra-op Plan:   Post-operative Plan:   Informed Consent: I have reviewed the patients History and Physical, chart, labs and discussed the procedure including the risks, benefits and alternatives for the proposed anesthesia with the patient or authorized representative who has indicated his/her understanding and acceptance.     Plan Discussed with: CRNA and Surgeon  Anesthesia Plan Comments:         Anesthesia Quick Evaluation

## 2016-01-14 NOTE — Discharge Instructions (Signed)
°Dr. Estephanie Hubbs °Joint Replacement Specialist °[facility] °[address] °[place], [place] [address] °[phone] ° ° °TOTAL HIP REPLACEMENT POSTOPERATIVE DIRECTIONS ° ° ° °Hip Rehabilitation, Guidelines Following Surgery  ° °WEIGHT BEARING °Weight bearing as tolerated with assist device (walker, cane, etc) as directed, use it as long as suggested by your surgeon or therapist, typically at least 4-6 weeks. ° °The results of a hip operation are greatly improved after range of motion and muscle strengthening exercises. Follow all safety measures which are given to protect your hip. If any of these exercises cause increased pain or swelling in your joint, decrease the amount until you are comfortable again. Then slowly increase the exercises. Call your caregiver if you have problems or questions.  ° °HOME CARE INSTRUCTIONS  °Most of the following instructions are designed to prevent the dislocation of your new hip.  °Remove items at home which could result in a fall. This includes throw rugs or furniture in walking pathways.  °Continue medications as instructed at time of discharge. °· You may have some home medications which will be placed on hold until you complete the course of blood thinner medication. °· You may start showering once you are discharged home. Do not remove your dressing. °Do not put on socks or shoes without following the instructions of your caregivers.   °Sit on chairs with arms. Use the chair arms to help push yourself up when arising.  °Arrange for the use of a toilet seat elevator so you are not sitting low.  °· Walk with walker as instructed.  °You may resume a sexual relationship in one month or when given the OK by your caregiver.  °Use walker as long as suggested by your caregivers.  °You may put full weight on your legs and walk as much as is comfortable. °Avoid periods of inactivity such as sitting longer than an hour when not asleep. This helps prevent  blood clots.  °You may return to work once you are cleared by your surgeon.  °Do not drive a car for 6 weeks or until released by your surgeon.  °Do not drive while taking narcotics.  °Wear elastic stockings for two weeks following surgery during the day but you may remove then at night.  °Make sure you keep all of your appointments after your operation with all of your doctors and caregivers. You should call the office at the above phone number and make an appointment for approximately two weeks after the date of your surgery. °Please pick up a stool softener and laxative for home use as long as you are requiring pain medications. °· ICE to the affected hip every three hours for 30 minutes at a time and then as needed for pain and swelling. Continue to use ice on the hip for pain and swelling from surgery. You may notice swelling that will progress down to the foot and ankle.  This is normal after surgery.  Elevate the leg when you are not up walking on it.   °It is important for you to complete the blood thinner medication as prescribed by your doctor. °· Continue to use the breathing machine which will help keep your temperature down.  It is common for your temperature to cycle up and down following surgery, especially at night when you are not up moving around and exerting yourself.  The breathing machine keeps your lungs expanded and your temperature down. ° °RANGE OF MOTION AND STRENGTHENING EXERCISES  °These exercises are   designed to help you keep full movement of your hip joint. Follow your caregiver's or physical therapist's instructions. Perform all exercises about fifteen times, three times per day or as directed. Exercise both hips, even if you have had only one joint replacement. These exercises can be done on a training (exercise) mat, on the floor, on a table or on a bed. Use whatever works the best and is most comfortable for you. Use music or television while you are exercising so that the exercises  are a pleasant break in your day. This will make your life better with the exercises acting as a break in routine you can look forward to.  °Lying on your back, slowly slide your foot toward your buttocks, raising your knee up off the floor. Then slowly slide your foot back down until your leg is straight again.  °Lying on your back spread your legs as far apart as you can without causing discomfort.  °Lying on your side, raise your upper leg and foot straight up from the floor as far as is comfortable. Slowly lower the leg and repeat.  °Lying on your back, tighten up the muscle in the front of your thigh (quadriceps muscles). You can do this by keeping your leg straight and trying to raise your heel off the floor. This helps strengthen the largest muscle supporting your knee.  °Lying on your back, tighten up the muscles of your buttocks both with the legs straight and with the knee bent at a comfortable angle while keeping your heel on the floor.  ° °SKILLED REHAB INSTRUCTIONS: °If the patient is transferred to a skilled rehab facility following release from the hospital, a list of the current medications will be sent to the facility for the patient to continue.  When discharged from the skilled rehab facility, please have the facility set up the patient's Home Health Physical Therapy prior to being released. Also, the skilled facility will be responsible for providing the patient with their medications at time of release from the facility to include their pain medication and their blood thinner medication. If the patient is still at the rehab facility at time of the two week follow up appointment, the skilled rehab facility will also need to assist the patient in arranging follow up appointment in our office and any transportation needs. ° °MAKE SURE YOU:  °Understand these instructions.  °Will watch your condition.  °Will get help right away if you are not doing well or get worse. ° °Pick up stool softner and  laxative for home use following surgery while on pain medications. °Do not remove your dressing. °The dressing is waterproof--it is OK to take showers. °Continue to use ice for pain and swelling after surgery. °Do not use any lotions or creams on the incision until instructed by your surgeon. °Total Hip Protocol. ° ° °

## 2016-01-14 NOTE — Progress Notes (Signed)
Portable AP Pelvis X-RAY done.

## 2016-01-14 NOTE — Transfer of Care (Signed)
Immediate Anesthesia Transfer of Care Note  Patient: Donald Carlson  Procedure(s) Performed: Procedure(s): CONVERSION OF LEFT HEMI HIP TO LEFT TOTAL HIP ARTHROPLASTY ANTERIOR APPROACH (Left)  Patient Location: PACU  Anesthesia Type:General  Level of Consciousness: awake, oriented, patient cooperative, lethargic and responds to stimulation  Airway & Oxygen Therapy: Patient Spontanous Breathing and Patient connected to face mask oxygen  Post-op Assessment: Report given to RN and Post -op Vital signs reviewed and stable  Post vital signs: Reviewed and stable  Last Vitals:  Vitals:   01/14/16 0545  BP: (!) 145/69  Pulse: (!) 58  Resp: 16  Temp: 36.8 C    Last Pain:  Vitals:   01/14/16 0545  TempSrc: Oral         Complications: No apparent anesthesia complications

## 2016-01-14 NOTE — Evaluation (Signed)
Physical Therapy Evaluation Patient Details Name: Donald Carlson MRN: [id number] DOB: 1946-02-09 Today's Date: 01/14/2016   History of Present Illness  Pt s/p L THR - conversion from hemi arthroplasty (posterior approach) and with hx of DM, bladder CA   Clinical Impression  Pt s/p L THR presents with decreased L LE strength/ROM and post op pain limiting functional mobility.  Pt should progress to dc home with family assist and HHPT follow up.    Follow Up Recommendations Home health PT    Equipment Recommendations  None recommended by PT    Recommendations for Other Services OT consult     Precautions / Restrictions Precautions Precautions: Fall Restrictions Weight Bearing Restrictions: No Other Position/Activity Restrictions: WBAT      Mobility  Bed Mobility Overal bed mobility: Needs Assistance Bed Mobility: Supine to Sit     Supine to sit: Min assist     General bed mobility comments: cues for sequence and use of R LE to self assist  Transfers Overall transfer level: Needs assistance Equipment used: Rolling walker (2 wheeled) Transfers: Sit to/from Stand Sit to Stand: Min assist         General transfer comment: cues for LE management and use of UEs to self assist  Ambulation/Gait Ambulation/Gait assistance: Min assist Ambulation Distance (Feet): 74 Feet Assistive device: Rolling walker (2 wheeled) Gait Pattern/deviations: Step-to pattern;Decreased step length - right;Decreased step length - left;Shuffle;Trunk flexed Gait velocity: decr Gait velocity interpretation: Below normal speed for age/gender General Gait Details: cues for sequence, posture and position from [employer]            Wheelchair Mobility    Modified Rankin (Stroke Patients Only)       Balance                                             Pertinent Vitals/Pain Pain Assessment: 0-10 Pain Score: 4  Pain Location: L hip Pain Descriptors /  Indicators: Aching;Sore Pain Intervention(s): Limited activity within patient's tolerance;Monitored during session;Premedicated before session;Ice applied    Home Living Family/patient expects to be discharged to:: Private residence Living Arrangements: Spouse/significant other Available Help at Discharge: Family Type of Home: House Home Access: Stairs to enter Entrance Stairs-Rails: None Entrance Stairs-Number of Steps: 3 Home Layout: One level Home Equipment: Environmental consultant - 2 wheels;Bedside commode      Prior Function Level of Independence: Independent               Hand Dominance        Extremity/Trunk Assessment   Upper Extremity Assessment: Overall WFL for tasks assessed           Lower Extremity Assessment: LLE deficits/detail      Cervical / Trunk Assessment: Normal  Communication   Communication: No difficulties  Cognition Arousal/Alertness: Awake/alert Behavior During Therapy: WFL for tasks assessed/performed Overall Cognitive Status: Within Functional Limits for tasks assessed                      General Comments      Exercises Total Joint Exercises Ankle Circles/Pumps: AROM;Both;15 reps;Supine   Assessment/Plan    PT Assessment Patient needs continued PT services  PT Problem List Decreased strength;Decreased range of motion;Decreased activity tolerance;Decreased mobility;Decreased knowledge of use of DME;Pain          PT Treatment Interventions DME instruction;Gait  training;Stair training;Functional mobility training;Therapeutic activities;Therapeutic exercise;Patient/family education    PT Goals (Current goals can be found in the Care Plan section)  Acute Rehab PT Goals Patient Stated Goal: Regain IND and HAVE NO MORE HIP SURGERIES!!! PT Goal Formulation: With patient Time For Goal Achievement: 01/18/16 Potential to Achieve Goals: Good    Frequency 7X/week   Barriers to discharge        Co-evaluation                End of Session Equipment Utilized During Treatment: Gait belt Activity Tolerance: Patient tolerated treatment well Patient left: in chair;with call bell/phone within reach Nurse Communication: Mobility status         Time: [id number] PT Time Calculation (min) (ACUTE ONLY): 29 min   Charges:   PT Evaluation $PT Eval Low Complexity: 1 Procedure PT Treatments $Gait Training: 8-22 mins   PT G Codes:        Jizel Cheeks 01-18-16, 6:19 PM

## 2016-01-14 NOTE — Progress Notes (Signed)
X-ray results noted 

## 2016-01-15 LAB — BASIC METABOLIC PANEL
Anion gap: 5 (ref 5–15)
BUN: 17 mg/dL (ref 6–20)
CO2: 26 mmol/L (ref 22–32)
Calcium: 8.2 mg/dL — ABNORMAL LOW (ref 8.9–10.3)
Chloride: 108 mmol/L (ref 101–111)
Creatinine, Ser: 1.07 mg/dL (ref 0.61–1.24)
GFR calc Af Amer: >60 mL/min (ref >60)
GFR calc non Af Amer: >60 mL/min (ref >60)
Glucose, Bld: 110 mg/dL — ABNORMAL HIGH (ref 65–99)
Potassium: 4.1 mmol/L (ref 3.5–5.1)
Sodium: 139 mmol/L (ref 135–145)

## 2016-01-15 LAB — CBC
HCT: 28.7 % — ABNORMAL LOW (ref 39.0–52.0)
Hemoglobin: 9.5 g/dL — ABNORMAL LOW (ref 13.0–17.0)
MCH: 29.5 pg (ref 26.0–34.0)
MCHC: 33.1 g/dL (ref 30.0–36.0)
MCV: 89.1 fL (ref 78.0–100.0)
Platelets: 150 10*3/uL (ref 150–400)
RBC: 3.22 MIL/uL — ABNORMAL LOW (ref 4.22–5.81)
RDW: 13.7 % (ref 11.5–15.5)
WBC: 7.2 10*3/uL (ref 4.0–10.5)

## 2016-01-15 LAB — GLUCOSE, CAPILLARY: Glucose-Capillary: 110 mg/dL — ABNORMAL HIGH (ref 65–99)

## 2016-01-15 MED ORDER — TAB-A-VITE/IRON PO TABS
1.0000 | ORAL_TABLET | Freq: Every day | ORAL | Status: DC
  Administered 2016-01-15: 1 via ORAL
  Filled 2016-01-15: qty 1

## 2016-01-15 MED ORDER — TAB-A-VITE/IRON PO TABS: 1.0000 | ORAL_TABLET | Freq: Every day | ORAL | 0 refills | Status: AC

## 2016-01-15 NOTE — Progress Notes (Signed)
Physical Therapy Treatment Patient Details Name: Donald Carlson MRN: [id number] DOB: 1945-10-14 Today's Date: 01/15/2016    History of Present Illness Pt s/p L THR - conversion from hemi arthroplasty (posterior approach) and with hx of DM, bladder CA     PT Comments    Pt progressing well with mobility and eager for dc to home.  Reviewed home therex, stairs, and car transfers with pt and spouse.  Follow Up Recommendations  Home health PT     Equipment Recommendations  None recommended by PT    Recommendations for Other Services OT consult     Precautions / Restrictions Precautions Precautions: Fall Restrictions Weight Bearing Restrictions: No Other Position/Activity Restrictions: WBAT    Mobility  Bed Mobility Overal bed mobility: Needs Assistance Bed Mobility: Supine to Sit     Supine to sit: Supervision     General bed mobility comments: cues for sequence and use of R LE to self assist  Transfers Overall transfer level: Needs assistance Equipment used: Rolling walker (2 wheeled) Transfers: Sit to/from Stand Sit to Stand: Supervision         General transfer comment: cues for LE management and use of UEs to self assist  Ambulation/Gait Ambulation/Gait assistance: Min guard;Supervision Ambulation Distance (Feet): 250 Feet Assistive device: Rolling walker (2 wheeled) Gait Pattern/deviations: Step-to pattern;Decreased step length - right;Decreased step length - left;Shuffle;Trunk flexed Gait velocity: decr Gait velocity interpretation: Below normal speed for age/gender General Gait Details: cues for sequence, posture and position from RW   Stairs Stairs: Yes Stairs assistance: Min assist Stair Management: No rails;Step to pattern;Forwards;With crutches Number of Stairs: 4 General stair comments: cues for sequence and foot/crutch placement  Wheelchair Mobility    Modified Rankin (Stroke Patients Only)       Balance                                     Cognition Arousal/Alertness: Awake/alert Behavior During Therapy: WFL for tasks assessed/performed Overall Cognitive Status: Within Functional Limits for tasks assessed                      Exercises Total Joint Exercises Ankle Circles/Pumps: AROM;Both;15 reps;Supine Quad Sets: AROM;Both;10 reps;Supine Heel Slides: AAROM;Left;20 reps;Supine Hip ABduction/ADduction: AAROM;Left;15 reps;Supine Long Arc Quad: AROM;Left;15 reps;Seated    General Comments        Pertinent Vitals/Pain Pain Assessment: 0-10 Pain Score: 4  Pain Location: L hip Pain Descriptors / Indicators: Aching;Sore Pain Intervention(s): Limited activity within patient's tolerance;Monitored during session;Premedicated before session;Ice applied    Home Living                      Prior Function            PT Goals (current goals can now be found in the care plan section) Acute Rehab PT Goals Patient Stated Goal: Regain IND and HAVE NO MORE HIP SURGERIES!!! PT Goal Formulation: With patient Time For Goal Achievement: 01/18/16 Potential to Achieve Goals: Good Progress towards PT goals: Progressing toward goals    Frequency    7X/week      PT Plan Current plan remains appropriate    Co-evaluation             End of Session Equipment Utilized During Treatment: Gait belt Activity Tolerance: Patient tolerated treatment well Patient left: in chair;with call bell/phone within reach  Time: [id number] PT Time Calculation (min) (ACUTE ONLY): 42 min  Charges:  $Gait Training: 8-22 mins $Therapeutic Exercise: 8-22 mins $Therapeutic Activity: 8-22 mins                    G Codes:      Fusaye Wachtel Feb 10, 2016, 1:13 PM

## 2016-01-15 NOTE — Op Note (Signed)
NAMEBYRD, BUSS NO.:  [id number]  MEDICAL RECORD NO.:  [id number]  LOCATION:  [id number]                         FACILITY:  [facility]  PHYSICIAN:  Rod Can, MD     DATE OF BIRTH:  29-Nov-1945  DATE OF PROCEDURE:  01/14/2016 DATE OF DISCHARGE:                              OPERATIVE REPORT   SURGEON:  Rod Can, MD.  ASSISTANT:  Nehemiah Massed, PA-C.  PREOPERATIVE DIAGNOSIS:  Failed left hip hemiarthroplasty.  POSTOPERATIVE DIAGNOSIS:  Failed left hip hemiarthroplasty.  PROCEDURE PERFORMED:  Conversion of previous surgery to left total hip arthroplasty.  EXPLANTS:  DePuy 55-mm bipolar articulation.  IMPLANTS: 1. Biomet G7 acetabular shell, 3 hole, size 56 mm. 2. G7 +5 mm, 36, E1 antioxidant liner. 3. Biomet 6.5 x 30 mm cancellous screw. 4. DePuy metal femoral head ball size 36, +15.5.  ANESTHESIA:  General.  ESTIMATED BLOOD LOSS:  250 mL.  COMPLICATIONS:  None.  SPECIMENS:  None.  TUBES AND DRAINS:  None.  DISPOSITION:  Stable to PACU.  INDICATIONS:  The patient is a 70 year old male who underwent left hip hemiarthroplasty by Dr. Maxie Better in 2014 for failed left femoral neck fracture pinning.  He has gone onto develop groin pain.  I worked him up with inflammatory markers that were negative.  He had an intra-articular injection of lidocaine that completely relieved his pain, albeit very briefly.  He had a triple phase bone scan that did not show any loosening of the femoral component.  Therefore, I suggested conversion of previous operation to total hip replacement.  We discussed the risks, benefits, and alternatives.  He elected to proceed.  DESCRIPTION OF PROCEDURE IN DETAIL:  I identified the patient in the holding area using 2 identifiers.  He was taken to the operating room. General anesthesia was induced on the bed.  He was transferred to the [facility] table.  The left hip was prepped and draped in normal sterile surgical fashion.  Time-out  was called verifying side and site of surgery.  He did receive 2 g of Ancef within 60 minutes of beginning the procedure.  I began by making a standard anterior approach to the hip. The approach was done through the Hueter interval.  I performed a T- shaped capsulotomy and I identified his bipolar articulation.  I applied traction to the leg and then I used a bone tamp to remove the head ball.  Traction was applied to the hip and I was able to remove the bipolar head.  This was passed off.  I inspected the trunnion, the trunnion was intact.  I removed a small amount of posterior capsule to create a pocket for the trunnion.  Acetabular retractors were placed. He did have eburnated bone within the acetabular socket.  I reamed sequentially from a 53 to a 55 mm reamer.  I then selected a 56 mm cup, which I opened and impacted into place at 40 degrees of adduction and 20 degrees of anteversion.  I placed a trial neutral liner.  I then placed a standard head ball trial.  The hip was reduced.  Soft tissue tension was extremely lax.  Therefore, I removed the trial components.  I placed  a real +5 neutral liner and then I sequentially trialed head balls until I had good stability.  The +15.5 head ball was needed for appropriate hip stability.  X-ray was brought in and it was found that his leg was lengthened slightly, but this was performed in order to achieve stability.  I placed him in 90 degrees of external rotation.  I extended the leg down to the floor.  I was unable to dislocate the hip with an anterior directed force using a bone hook.  I then removed his boot from the leg Spar.  I flexed him up to 90 degrees and I was able to internally rotate him at least 60 degrees before there was any hip impingement or subluxation.  Therefore, I deemed that the hip was very stable.  I exchanged the trial head ball for the real head ball.  The hip was reduced.  Final x-rays were obtained.  The wound was  then copiously irrigated with dilute Betadine solution followed by saline with pulse lavage.  Marcaine solution was injected into the periarticular tissues.  I closed the wound with #1 Stratafix for the fascia, 2-0 Monocryl for the deep dermal layer, running 3-0 Monocryl subcuticular stitch.  Glue was applied.  Once the glue was hardened, sterile dressing applied.  The patient was extubated, taken to PACU in stable condition.  Sponge, needle, and instrument counts were correct at the end of the case x2.  There were no known complications.  Postoperatively, we will admit the patient to the hospital.  He may weight bear as tolerated with a walker.  We will place him on aspirin for DVT prophylaxis.  We will likely be able to discharge him home tomorrow with home health physical therapy.  I will see him in the office in 2 weeks.          ______________________________ Rod Can, MD     BS/MEDQ  D:  01/14/2016  T:  01/15/2016  Job:  [id number]

## 2016-01-15 NOTE — Progress Notes (Signed)
Subjective: 1 Day Post-Op Procedure(s) (LRB): CONVERSION OF LEFT HEMI HIP TO LEFT TOTAL HIP ARTHROPLASTY ANTERIOR APPROACH (Left) Patient reports pain as 3 on 0-10 scale.    Objective: Vital signs in last 24 hours: Temp:  [97.5 F (36.4 C)-98.5 F (36.9 C)] 98.5 F (36.9 C) (10/28 0604) Pulse Rate:  [56-68] 64 (10/28 0604) Resp:  [11-18] 16 (10/28 0604) BP: (115-170)/(55-89) 141/63 (10/28 0604) SpO2:  [97 %-100 %] 97 % (10/28 0604)  Intake/Output from previous day: 10/27 0701 - 10/28 0700 In: 4986.7 [P.O.:960; I.V.:3871.7; IV Piggyback:155] Out: 3250 [Urine:3025; Blood:225] Intake/Output this shift: No intake/output data recorded.   Recent Labs  01/15/16 0440  HGB 9.5*    Recent Labs  01/15/16 0440  WBC 7.2  RBC 3.22*  HCT 28.7*  PLT 150    Recent Labs  01/15/16 0440  NA 139  K 4.1  CL 108  CO2 26  BUN 17  CREATININE 1.07  GLUCOSE 110*  CALCIUM 8.2*   No results for input(s): LABPT, INR in the last 72 hours.  Neurologically intact Neurovascular intact Intact pulses distally Incision: dressing C/D/I  Assessment/Plan: 1 Day Post-Op Procedure(s) (LRB): CONVERSION OF LEFT HEMI HIP TO LEFT TOTAL HIP ARTHROPLASTY ANTERIOR APPROACH (Left) Advance diet D/C IV fluids Discharge home with home health  Vinod Mikesell C 01/15/2016, 8:25 AM

## 2016-01-15 NOTE — Care Management Note (Signed)
Case Management Note  Patient Details  Name: [name] MRN: [id number] Date of Birth: 08-24-1945  Subjective/Objective:                  CONVERSION OF LEFT HEMI HIP TO LEFT TOTAL HIP ARTHROPLASTY ANTERIOR APPROACH (Left) Action/Plan: Discharge planning Expected Discharge Date:  01/16/16               Expected Discharge Plan:  [place]  In-House Referral:     Discharge planning Services  CM Consult  Post Acute Care Choice:    Choice offered to:  Patient  DME Arranged:    DME Agency:     HH Arranged:    [place] Agency:     Status of Service:  Completed, signed off  If discussed at [employer], dates discussed:    Additional Comments: CM spoke with Ms. Perkins to confirm pt and spouse have declined all [place] services; Ms. Carlyon confirms.  NO other CM needs were communicated. Dellie Catholic, RN 01/15/2016, 10:53 AM

## 2016-01-15 NOTE — Progress Notes (Signed)
OT Cancellation Note  Patient Details Name: KASYN BRAU MRN: [id number] DOB: 1945/07/01   Cancelled Treatment:    Reason Eval/Treat Not Completed: OT screened, no needs identified, will sign off  Sullivan, Thereasa Parkin 01/15/2016, 10:01 AM

## 2016-01-15 NOTE — Progress Notes (Signed)
Pt to d/c home. Patient doesn't feel he needs any home health PT, as he has been through many ortho surgeries and he understands all exercises and instructions. No DME needs. AVS reviewed and "My Chart" discussed with pt. Pt capable of verbalizing medications, signs and symptoms of infection, and follow-up appointments. Remains hemodynamically stable. No signs and symptoms of distress. Educated pt to return to ER in the case of SOB, dizziness, or chest pain.

## 2016-01-17 ENCOUNTER — Telehealth: Payer: Self-pay | Admitting: *Deleted

## 2016-01-17 NOTE — Telephone Encounter (Signed)
Pt was on TCM list admitted for Failed total hip arthroplasty. Pt had a CONVERSION OF LEFT HEMI HIP TO LEFT TOTAL HIP ARTHROPLASTY ANTERIOR APPROACH. He was d/c 01/15/16 and will f/u w/Dr. Lyla Glassing in 2 weeks...Donald Carlson

## 2016-02-01 DIAGNOSIS — Z96642 Presence of left artificial hip joint: Secondary | ICD-10-CM | POA: Diagnosis not present

## 2016-02-01 DIAGNOSIS — Z471 Aftercare following joint replacement surgery: Secondary | ICD-10-CM | POA: Diagnosis not present

## 2016-02-01 DIAGNOSIS — M25552 Pain in left hip: Secondary | ICD-10-CM | POA: Diagnosis not present

## 2016-02-16 DIAGNOSIS — L500 Allergic urticaria: Secondary | ICD-10-CM | POA: Diagnosis not present

## 2016-02-29 ENCOUNTER — Telehealth: Payer: Self-pay | Admitting: Internal Medicine

## 2016-02-29 NOTE — Telephone Encounter (Signed)
Spoke with pt to inform.  

## 2016-02-29 NOTE — Telephone Encounter (Signed)
Please advise on whether pt needs to hold BP meds. Pt has OV tomorrow with [facility].

## 2016-02-29 NOTE — Telephone Encounter (Signed)
Do not take / hold amlodipine

## 2016-02-29 NOTE — Telephone Encounter (Signed)
Patient Name: Donald Carlson  DOB: 1945-08-08    Initial Comment Caller states husband's bp been dropping at night, goes down to 90/51, gets real dizzy and says everything starts looking pinkish. It is back up now   Nurse Assessment  Nurse: Donald Girt, RN, Almyra Free Date/Time Eilene Ghazi Time): 02/29/2016 9:04:16 AM  Confirm and document reason for call. If symptomatic, describe symptoms. ---Caller states, spoke to pt., that his BP has been dropping at night for the last month, but for the last 3 nights it has been worse. Last night it was 89/48, he was lightheaded and everything looked "pink". He drank fluids and ate some salt. Before bed it was 109/52. This morning it is 129/74, HR 68-74.  Does the patient have any new or worsening symptoms? ---Yes  Will a triage be completed? ---Yes  Related visit to physician within the last 2 weeks? ---No  Does the PT have any chronic conditions? (i.e. diabetes, asthma, etc.) ---Yes  List chronic conditions. ---Htn, Diabetes, THA 12/2015, Gout  Is this a behavioral health or substance abuse call? ---No     Guidelines    Guideline Title Affirmed Question Affirmed Notes  Low Blood Pressure [id number] Systolic BP [id number] AND [id number] taking blood pressure medications AND [3] NOT dizzy, lightheaded or weak    Final Disposition User   See Physician within [place], RN, Almyra Free    Comments  Caller is unable to come in to the office today, scheduled appointment for tomorrow but does need a call back in regards to holding BP medication.   Referrals  REFERRED TO PCP OFFICE   Disagree/Comply: Comply

## 2016-03-01 ENCOUNTER — Encounter: Payer: Self-pay | Admitting: Internal Medicine

## 2016-03-01 ENCOUNTER — Ambulatory Visit (INDEPENDENT_AMBULATORY_CARE_PROVIDER_SITE_OTHER): Payer: Medicare Other | Admitting: Internal Medicine

## 2016-03-01 DIAGNOSIS — I10 Essential (primary) hypertension: Secondary | ICD-10-CM

## 2016-03-01 DIAGNOSIS — IMO0002 Reserved for concepts with insufficient information to code with codable children: Secondary | ICD-10-CM

## 2016-03-01 DIAGNOSIS — R7989 Other specified abnormal findings of blood chemistry: Secondary | ICD-10-CM | POA: Diagnosis not present

## 2016-03-01 DIAGNOSIS — Z96642 Presence of left artificial hip joint: Secondary | ICD-10-CM | POA: Diagnosis not present

## 2016-03-01 DIAGNOSIS — E1140 Type 2 diabetes mellitus with diabetic neuropathy, unspecified: Secondary | ICD-10-CM

## 2016-03-01 DIAGNOSIS — E538 Deficiency of other specified B group vitamins: Secondary | ICD-10-CM

## 2016-03-01 DIAGNOSIS — H53483 Generalized contraction of visual field, bilateral: Secondary | ICD-10-CM

## 2016-03-01 DIAGNOSIS — E1165 Type 2 diabetes mellitus with hyperglycemia: Secondary | ICD-10-CM

## 2016-03-01 DIAGNOSIS — H4020X0 Unspecified primary angle-closure glaucoma, stage unspecified: Secondary | ICD-10-CM | POA: Diagnosis not present

## 2016-03-01 NOTE — Progress Notes (Signed)
Subjective:  Patient ID: Donald Carlson, male    DOB: 11-09-1945  Age: 70 y.o. MRN: [id number]  CC: Loss of Vision (described as "tunnel vision" and everything he can see turns a pale orange color., he feels like from hypotension. He has his BP readings with him today.)   HPI Donald Carlson presents for BP 89/48. He had 2 episodes this week of "tunnel vision" 1-2 hrs. Then it would resolve. No HAs Eye exam 4/year for glaucoma - on Betoptic 1 gtt bid each eye   Outpatient Medications Prior to Visit  Medication Sig Dispense Refill  . amLODipine (NORVASC) 5 MG tablet Take 1 tablet (5 mg total) by mouth daily. (Patient taking differently: Take 5 mg by mouth daily. Takes at [employer]) 90 tablet 3  . aspirin 81 MG tablet Take 1 tablet (81 mg total) by mouth 2 (two) times daily after a meal. 60 tablet 1  . betaxolol (BETOPTIC-S) 0.25 % ophthalmic suspension Place 1 drop into both eyes 2 (two) times daily.     . calcium carbonate (OSCAL) 1500 (600 Ca) MG TABS tablet Take 600 mg of elemental calcium by mouth daily with breakfast.    . carvedilol (COREG) 25 MG tablet Take 1 tablet (25 mg total) by mouth 2 (two) times daily with a meal. 180 tablet 2  . cholecalciferol (VITAMIN D) 1000 units tablet Take 1,000 Units by mouth daily.    Marland Kitchen docusate sodium (COLACE) 100 MG capsule Take 1 capsule (100 mg total) by mouth 2 (two) times daily. 60 capsule 3  . Flaxseed, Linseed, (FLAX SEED OIL) 1000 MG CAPS Take 1,000 mg by mouth daily.    Marland Kitchen glucose blood (ONE TOUCH ULTRA TEST) test strip 1 each by Other route 2 (two) times daily. Use to check blood sugars twice a day Dx e11.9 100 each 1  . HYDROcodone-acetaminophen (NORCO) 5-325 MG tablet Take 1-2 tablets by mouth every 4 (four) hours as needed for moderate pain. 90 tablet 0  . lisinopril (PRINIVIL,ZESTRIL) 20 MG tablet Take 1 tablet (20 mg total) by mouth 2 (two) times daily. 180 tablet 3  . metFORMIN (GLUCOPHAGE) 500 MG tablet Take 1 tablet (500 mg total) by  mouth 2 (two) times daily. 180 tablet 3  . Multiple Vitamins-Iron (MULTIVITAMINS WITH IRON) TABS tablet Take 1 tablet by mouth daily. 30 tablet 0  . ondansetron (ZOFRAN) 4 MG tablet Take 1 tablet (4 mg total) by mouth every 8 (eight) hours as needed for nausea or vomiting. 20 tablet 0  . pioglitazone (ACTOS) 15 MG tablet Take 1 tablet (15 mg total) by mouth daily. (Patient taking differently: Take 15 mg by mouth daily. Patient takes inpm) 90 tablet 3  . pravastatin (PRAVACHOL) 40 MG tablet Take 1 tablet (40 mg total) by mouth daily. (Patient taking differently: Take 40 mg by mouth daily. Patient takes in pm) 90 tablet 3  . Saw Palmetto, Serenoa repens, (SAW PALMETTO PO) Take 1 tablet by mouth every morning.    . senna (SENOKOT) 8.6 MG TABS tablet Take 2 tablets (17.2 mg total) by mouth at bedtime. 60 each 3   No facility-administered medications prior to visit.     ROS Review of Systems  Constitutional: Negative for appetite change, fatigue and unexpected weight change.  HENT: Negative for congestion, nosebleeds, sneezing, sore throat and trouble swallowing.   Eyes: Positive for visual disturbance. Negative for itching.  Respiratory: Negative for cough.   Cardiovascular: Negative for chest pain, palpitations and leg swelling.  Gastrointestinal: Negative for abdominal distention, blood in stool, diarrhea and nausea.  Genitourinary: Negative for frequency and hematuria.  Musculoskeletal: Positive for arthralgias and gait problem. Negative for back pain, joint swelling and neck pain.  Skin: Negative for rash.  Neurological: Negative for dizziness, tremors, speech difficulty and weakness.  Psychiatric/Behavioral: Negative for agitation, dysphoric mood and sleep disturbance. The patient is not nervous/anxious.     Objective:  BP (!) 158/84   Pulse (!) 55   Wt 198 lb (89.8 kg)   SpO2 97%   BMI 28.41 kg/m   BP Readings from Last 3 Encounters:  03/01/16 (!) 158/84  01/15/16 129/73    01/05/16 129/61    Wt Readings from Last 3 Encounters:  03/01/16 198 lb (89.8 kg)  01/14/16 198 lb (89.8 kg)  01/05/16 198 lb (89.8 kg)    Physical Exam  Constitutional: He is oriented to person, place, and time. He appears well-developed. No distress.  NAD  HENT:  Mouth/Throat: Oropharynx is clear and moist.  Eyes: Conjunctivae are normal. Pupils are equal, round, and reactive to light.  Neck: Normal range of motion. No JVD present. No thyromegaly present.  Cardiovascular: Normal rate, regular rhythm, normal heart sounds and intact distal pulses.  Exam reveals no gallop and no friction rub.   No murmur heard. Pulmonary/Chest: Effort normal and breath sounds normal. No respiratory distress. He has no wheezes. He has no rales. He exhibits no tenderness.  Abdominal: Soft. Bowel sounds are normal. He exhibits no distension and no mass. There is no tenderness. There is no rebound and no guarding.  Musculoskeletal: Normal range of motion. He exhibits tenderness. He exhibits no edema.  Lymphadenopathy:    He has no cervical adenopathy.  Neurological: He is alert and oriented to person, place, and time. He has normal reflexes. No cranial nerve deficit. He exhibits normal muscle tone. He displays a negative Romberg sign. Coordination and gait normal.  Skin: Skin is warm and dry. No rash noted.  Psychiatric: He has a normal mood and affect. His behavior is normal. Judgment and thought content normal.  firm eyeballs to palpation. Unable to do a good eye exam - small pupils L hip is tender; limping  Lab Results  Component Value Date   WBC 7.2 01/15/2016   HGB 9.5 (L) 01/15/2016   HCT 28.7 (L) 01/15/2016   PLT 150 01/15/2016   GLUCOSE 110 (H) 01/15/2016   CHOL 152 04/07/2015   TRIG 107.0 04/07/2015   HDL 36.90 (L) 04/07/2015   LDLDIRECT 116.7 02/03/2014   LDLCALC 94 04/07/2015   ALT 16 04/07/2015   AST 20 04/07/2015   NA 139 01/15/2016   K 4.1 01/15/2016   CL 108 01/15/2016    CREATININE 1.07 01/15/2016   BUN 17 01/15/2016   CO2 26 01/15/2016   TSH 1.17 04/07/2015   PSA 0.37 10/23/2007   INR 0.96 01/23/2013   HGBA1C 6.2 (H) 01/05/2016   MICROALBUR 0.7 04/07/2015    No results found.  Assessment & Plan:   There are no diagnoses linked to this encounter. I am having Mr. Percle maintain his betaxolol, (Saw Palmetto, Serenoa repens, (SAW PALMETTO PO)), glucose blood, amLODipine, lisinopril, pravastatin, pioglitazone, metFORMIN, carvedilol, cholecalciferol, Flax Seed Oil, calcium carbonate, HYDROcodone-acetaminophen, ondansetron, senna, docusate sodium, aspirin, and multivitamins with iron.  No orders of the defined types were placed in this encounter.    Follow-up: No Follow-up on file.  Walker Kehr, MD

## 2016-03-01 NOTE — Assessment & Plan Note (Addendum)
On B12 - [place]

## 2016-03-01 NOTE — Patient Instructions (Addendum)
Use Betoptic 2 drops in each eye twice a day  Glaucoma Glaucoma is a condition that is caused by high pressure inside the eye. The pressure in the eye is raised because fluid (aqueous humor) within the eye cannot get out through the normal drainage system. The increased pressure can cause damage to the nerves of the eye, and that can result in loss of vision. It is important to diagnose glaucoma early before damage occurs. Early treatment can often prevent vision loss. There are two main types of glaucoma:  Open-angle glaucoma. This is a chronic condition that causes the pressure inside the eye to rise slowly. This is the most common type of glaucoma, and it often causes no symptoms until later in the disease.  Acute angle-closure glaucoma. With this type, the pressure inside the eye rises suddenly to a very high level. This causes severe pain and requires treatment right away. What are the causes? In many cases, the cause of this condition is not known. Glaucoma can sometimes result from other diseases, such as infection, cataracts, or tumors. What increases the risk? This condition is more likely to develop in people who:  Are older than 71 years of age.  Are of African-American descent.  Have high blood pressure or diabetes.  Have a family history of glaucoma.  Have a previous eye injury or eye surgery.  Are farsighted.  Use certain medicines that increase pressure in the eyes or cause the pupils to widen (dilate). What are the signs or symptoms? In many cases, open-angle glaucoma causes no symptoms early on. If it is not treated, the condition will get worse and may cause a loss of side vision (peripheral vision). This can advance to tunnel vision, which means that you are able to see straight ahead but you have a loss of peripheral vision in all directions. Eventually, total loss of vision can occur. Symptoms of acute angle-closure glaucoma develop suddenly and may  include:  Clouded vision.  Severe pain in your affected eye.  Severe headache in the area around your eye.  Feeling nauseous.  Vomiting. How is this diagnosed? Glaucoma is diagnosed through an eye exam by an eye specialist (ophthalmologist). This specialist will perform a test to measure the pressure in your eye (tonometry). Eye tests to check your vision- especially your peripheral vision-will be done. The ophthalmologist will also use various instruments to look inside your eye and check for damage to the nerves. Because glaucoma often does not cause symptoms until late in the disease, it is often found during a routine eye exam. It is important to have your eyes checked regularly. How is this treated? Early treatment is important to prevent vision loss. Treatment will focus on lowering the pressure in your eyes. This usually involves using medicines in the form of eye drops. The type of medicine will depend on how bad the disease is and the degree of damage. Laser treatments or other types of surgery may be needed in some cases. Acute angle-closure glaucoma requires emergency treatment to help prevent vision loss. This treatment may involve eye drops and medicines that are given in pill form or through an IV tube. Surgery is usually done on both eyes right away. Follow these instructions at home:  Take medicines only as directed by your health care provider. Use your eye drops exactly as instructed.It is likely that you will need to use this medicine for the rest of your life.  Get regular exercise, but talk with your health  care provider about which types of exercise are safe for you. You may need to avoid yoga or other types of exercise that may involve standing on your head.  Keep all follow-up visits as directed by your health care provider. This is important. Contact a health care provider if:  You have new or worsening symptoms. Get help right away if:  You have severe pain in  your affected eye.  You have problems with your vision.  You have a bad headache in the area around your eye.  You develop nausea and vomiting.  You have the same or similar symptoms in your other eye. This information is not intended to replace advice given to you by your health care provider. Make sure you discuss any questions you have with your health care provider. Document Released: 03/06/2005 Document Revised: 08/12/2015 Document Reviewed: 12/16/2013 Elsevier Interactive Patient Education  2017 [employer].

## 2016-03-01 NOTE — Assessment & Plan Note (Signed)
Episodic - likely glaucoma related Increase Betoptic appt Dr Delman Cheadle ASA bid 81 mg

## 2016-03-01 NOTE — Assessment & Plan Note (Signed)
On Metformin 

## 2016-03-01 NOTE — Assessment & Plan Note (Signed)
On Betoptic - increase to 2 gtt bid each eye See Dr Delman Cheadle ASAP

## 2016-03-01 NOTE — Assessment & Plan Note (Signed)
Coreg, Norvasc, Lisinopril to cont

## 2016-03-01 NOTE — Progress Notes (Signed)
Pre visit review using our clinic review tool, if applicable. No additional management support is needed unless otherwise documented below in the visit note. 

## 2016-03-03 DIAGNOSIS — H401232 Low-tension glaucoma, bilateral, moderate stage: Secondary | ICD-10-CM | POA: Diagnosis not present

## 2016-06-02 DIAGNOSIS — H401231 Low-tension glaucoma, bilateral, mild stage: Secondary | ICD-10-CM | POA: Diagnosis not present

## 2016-06-21 DIAGNOSIS — Z96642 Presence of left artificial hip joint: Secondary | ICD-10-CM | POA: Diagnosis not present

## 2016-06-21 DIAGNOSIS — Z471 Aftercare following joint replacement surgery: Secondary | ICD-10-CM | POA: Diagnosis not present

## 2016-06-26 ENCOUNTER — Other Ambulatory Visit: Payer: Self-pay | Admitting: Internal Medicine

## 2016-07-08 ENCOUNTER — Other Ambulatory Visit: Payer: Self-pay | Admitting: Internal Medicine

## 2016-07-11 ENCOUNTER — Other Ambulatory Visit (INDEPENDENT_AMBULATORY_CARE_PROVIDER_SITE_OTHER): Payer: Medicare Other

## 2016-07-11 ENCOUNTER — Encounter: Payer: Self-pay | Admitting: Internal Medicine

## 2016-07-11 ENCOUNTER — Ambulatory Visit (INDEPENDENT_AMBULATORY_CARE_PROVIDER_SITE_OTHER): Payer: Medicare Other | Admitting: Internal Medicine

## 2016-07-11 VITALS — BP 130/82 | HR 58 | Ht 70.0 in | Wt 202.6 lb

## 2016-07-11 DIAGNOSIS — E1165 Type 2 diabetes mellitus with hyperglycemia: Secondary | ICD-10-CM

## 2016-07-11 DIAGNOSIS — E1140 Type 2 diabetes mellitus with diabetic neuropathy, unspecified: Secondary | ICD-10-CM | POA: Diagnosis not present

## 2016-07-11 DIAGNOSIS — IMO0002 Reserved for concepts with insufficient information to code with codable children: Secondary | ICD-10-CM

## 2016-07-11 DIAGNOSIS — Z1159 Encounter for screening for other viral diseases: Secondary | ICD-10-CM | POA: Diagnosis not present

## 2016-07-11 DIAGNOSIS — I10 Essential (primary) hypertension: Secondary | ICD-10-CM

## 2016-07-11 DIAGNOSIS — Z23 Encounter for immunization: Secondary | ICD-10-CM | POA: Diagnosis not present

## 2016-07-11 DIAGNOSIS — E782 Mixed hyperlipidemia: Secondary | ICD-10-CM

## 2016-07-11 LAB — CBC WITH DIFFERENTIAL/PLATELET
Basophils Absolute: 0.1 10*3/uL (ref 0.0–0.1)
Basophils Relative: 0.7 % (ref 0.0–3.0)
Eosinophils Absolute: 0.4 10*3/uL (ref 0.0–0.7)
Eosinophils Relative: 6.1 % — ABNORMAL HIGH (ref 0.0–5.0)
HCT: 36.4 % — ABNORMAL LOW (ref 39.0–52.0)
Hemoglobin: 12.2 g/dL — ABNORMAL LOW (ref 13.0–17.0)
Lymphocytes Relative: 21.6 % (ref 12.0–46.0)
Lymphs Abs: 1.6 10*3/uL (ref 0.7–4.0)
MCHC: 33.6 g/dL (ref 30.0–36.0)
MCV: 85 fl (ref 78.0–100.0)
Monocytes Absolute: 0.7 10*3/uL (ref 0.1–1.0)
Monocytes Relative: 9.2 % (ref 3.0–12.0)
Neutro Abs: 4.6 10*3/uL (ref 1.4–7.7)
Neutrophils Relative %: 62.4 % (ref 43.0–77.0)
Platelets: 208 10*3/uL (ref 150.0–400.0)
RBC: 4.28 Mil/uL (ref 4.22–5.81)
RDW: 14.5 % (ref 11.5–15.5)
WBC: 7.4 10*3/uL (ref 4.0–10.5)

## 2016-07-11 LAB — COMPREHENSIVE METABOLIC PANEL
ALT: 15 U/L (ref 0–53)
AST: 18 U/L (ref 0–37)
Albumin: 4.2 g/dL (ref 3.5–5.2)
Alkaline Phosphatase: 61 U/L (ref 39–117)
BUN: 18 mg/dL (ref 6–23)
CO2: 27 mEq/L (ref 19–32)
Calcium: 9.5 mg/dL (ref 8.4–10.5)
Chloride: 106 mEq/L (ref 96–112)
Creatinine, Ser: 1.06 mg/dL (ref 0.40–1.50)
GFR: 73.1 mL/min (ref >60.00)
Glucose, Bld: 143 mg/dL — ABNORMAL HIGH (ref 70–99)
Potassium: 4.3 mEq/L (ref 3.5–5.1)
Sodium: 139 mEq/L (ref 135–145)
Total Bilirubin: 0.5 mg/dL (ref 0.2–1.2)
Total Protein: 6.8 g/dL (ref 6.0–8.3)

## 2016-07-11 LAB — LIPID PANEL
Cholesterol: 159 mg/dL (ref 0–200)
HDL: 35.8 mg/dL — ABNORMAL LOW (ref >39.00)
LDL Cholesterol: 98 mg/dL (ref 0–99)
NonHDL: 122.82
Total CHOL/HDL Ratio: 4
Triglycerides: 125 mg/dL (ref 0.0–149.0)
VLDL: 25 mg/dL (ref 0.0–40.0)

## 2016-07-11 LAB — HEMOGLOBIN A1C: Hgb A1c MFr Bld: 6.8 % — ABNORMAL HIGH (ref 4.6–6.5)

## 2016-07-11 LAB — HEPATITIS C ANTIBODY: HCV Ab: NEGATIVE

## 2016-07-11 NOTE — Assessment & Plan Note (Signed)
BP well controlled Current regimen effective and well tolerated Continue current medications at current doses cmp, cbc

## 2016-07-11 NOTE — Addendum Note (Signed)
Addended by: Katrine Coho on: 07/11/2016 08:45 AM   Modules accepted: Orders

## 2016-07-11 NOTE — Patient Instructions (Addendum)
  Test(s) ordered today. Your results will be released to [place] (or called to you) after review, usually within 72hours after test completion. If any changes need to be made, you will be notified at that same time.  All other Health Maintenance issues reviewed.   All recommended immunizations and age-appropriate screenings are up-to-date or discussed.  Prevnar immunization administered today.   Medications reviewed and updated.  No changes recommended at this time.   Please followup in one year for a physical

## 2016-07-11 NOTE — Assessment & Plan Note (Signed)
Check lipid panel  Continue daily statin Regular exercise and healthy diet encouraged  
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
## 2016-07-11 NOTE — Assessment & Plan Note (Signed)
Check a1c Low sugar / carb diet Stressed regular exercise, weight loss  

## 2016-07-12 ENCOUNTER — Encounter: Payer: Self-pay | Admitting: Internal Medicine

## 2016-07-16 ENCOUNTER — Other Ambulatory Visit: Payer: Self-pay | Admitting: Internal Medicine

## 2016-07-16 DIAGNOSIS — I10 Essential (primary) hypertension: Secondary | ICD-10-CM

## 2016-07-26 DIAGNOSIS — M7632 Iliotibial band syndrome, left leg: Secondary | ICD-10-CM | POA: Diagnosis not present

## 2016-07-26 DIAGNOSIS — Z471 Aftercare following joint replacement surgery: Secondary | ICD-10-CM | POA: Diagnosis not present

## 2016-07-26 DIAGNOSIS — Z96642 Presence of left artificial hip joint: Secondary | ICD-10-CM | POA: Diagnosis not present

## 2016-08-01 ENCOUNTER — Encounter: Payer: Self-pay | Admitting: Internal Medicine

## 2016-08-01 NOTE — Telephone Encounter (Signed)
Pt's wife called and said that there was a mistake in the message that was sent. He is taking: Carvedliol and Piodlitazone He is not taking: Lisinopril and Amlodipine  They wanted to let you know in case that changed your response.

## 2016-09-01 DIAGNOSIS — H401232 Low-tension glaucoma, bilateral, moderate stage: Secondary | ICD-10-CM | POA: Diagnosis not present

## 2016-09-24 ENCOUNTER — Other Ambulatory Visit: Payer: Self-pay | Admitting: Internal Medicine

## 2016-09-30 ENCOUNTER — Other Ambulatory Visit: Payer: Self-pay | Admitting: Internal Medicine

## 2016-11-15 DIAGNOSIS — C672 Malignant neoplasm of lateral wall of bladder: Secondary | ICD-10-CM | POA: Diagnosis not present

## 2016-12-01 ENCOUNTER — Encounter: Payer: Self-pay | Admitting: Internal Medicine

## 2016-12-04 MED ORDER — FREESTYLE LIBRE SENSOR SYSTEM MISC: 0 refills | Status: DC

## 2016-12-11 DIAGNOSIS — H401232 Low-tension glaucoma, bilateral, moderate stage: Secondary | ICD-10-CM | POA: Diagnosis not present

## 2017-02-19 DIAGNOSIS — L814 Other melanin hyperpigmentation: Secondary | ICD-10-CM | POA: Diagnosis not present

## 2017-02-19 DIAGNOSIS — L821 Other seborrheic keratosis: Secondary | ICD-10-CM | POA: Diagnosis not present

## 2017-02-19 DIAGNOSIS — Z85828 Personal history of other malignant neoplasm of skin: Secondary | ICD-10-CM | POA: Diagnosis not present

## 2017-02-19 DIAGNOSIS — D225 Melanocytic nevi of trunk: Secondary | ICD-10-CM | POA: Diagnosis not present

## 2017-03-23 DIAGNOSIS — H401232 Low-tension glaucoma, bilateral, moderate stage: Secondary | ICD-10-CM | POA: Diagnosis not present

## 2017-06-26 ENCOUNTER — Other Ambulatory Visit: Payer: Self-pay | Admitting: Internal Medicine

## 2017-07-04 DIAGNOSIS — H401232 Low-tension glaucoma, bilateral, moderate stage: Secondary | ICD-10-CM | POA: Diagnosis not present

## 2017-07-04 DIAGNOSIS — E119 Type 2 diabetes mellitus without complications: Secondary | ICD-10-CM | POA: Diagnosis not present

## 2017-07-04 LAB — HM DIABETES EYE EXAM

## 2017-07-10 ENCOUNTER — Encounter: Payer: Self-pay | Admitting: Internal Medicine

## 2017-07-11 ENCOUNTER — Ambulatory Visit: Payer: Self-pay

## 2017-07-11 ENCOUNTER — Ambulatory Visit: Payer: Self-pay | Admitting: Internal Medicine

## 2017-07-21 ENCOUNTER — Other Ambulatory Visit: Payer: Self-pay | Admitting: Internal Medicine
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
## 2017-07-26 NOTE — Patient Instructions (Addendum)
Test(s) ordered today. Your results will be released to [place] (or called to you) after review, usually within 72hours after test completion. If any changes need to be made, you will be notified at that same time.   Medications reviewed and updated.   No changes recommended at this time.   Please followup in 6 months    Health Maintenance, Male A healthy lifestyle and preventive care is important for your health and wellness. Ask your health care provider about what schedule of regular examinations is right for you. What should I know about weight and diet? Eat a Healthy Diet  Eat plenty of vegetables, fruits, whole grains, low-fat dairy products, and lean protein.  Do not eat a lot of foods high in solid fats, added sugars, or salt.  Maintain a Healthy Weight Regular exercise can help you achieve or maintain a healthy weight. You should:  Do at least 150 minutes of exercise each week. The exercise should increase your heart rate and make you sweat (moderate-intensity exercise).  Do strength-training exercises at least twice a week.  Watch Your Levels of Cholesterol and Blood Lipids  Have your blood tested for lipids and cholesterol every 5 years starting at 73 years of age. If you are at high risk for heart disease, you should start having your blood tested when you are 73 years old. You may need to have your cholesterol levels checked more often if: ? Your lipid or cholesterol levels are high. ? You are older than 73 years of age. ? You are at high risk for heart disease.  What should I know about cancer screening? Many types of cancers can be detected early and may often be prevented. Lung Cancer  You should be screened every year for lung cancer if: ? You are a current smoker who has smoked for at least 30 years. ? You are a former smoker who has quit within the past 15 years.  Talk to your health care provider about your screening options, when you should start  screening, and how often you should be screened.  Colorectal Cancer  Routine colorectal cancer screening usually begins at 73 years of age and should be repeated every 5-10 years until you are 73 years old. You may need to be screened more often if early forms of precancerous polyps or small growths are found. Your health care provider may recommend screening at an earlier age if you have risk factors for colon cancer.  Your health care provider may recommend using home test kits to check for hidden blood in the stool.  A small camera at the end of a tube can be used to examine your colon (sigmoidoscopy or colonoscopy). This checks for the earliest forms of colorectal cancer.  Prostate and Testicular Cancer  Depending on your age and overall health, your health care provider may do certain tests to screen for prostate and testicular cancer.  Talk to your health care provider about any symptoms or concerns you have about testicular or prostate cancer.  Skin Cancer  Check your skin from head to toe regularly.  Tell your health care provider about any new moles or changes in moles, especially if: ? There is a change in a mole's size, shape, or color. ? You have a mole that is larger than a pencil eraser.  Always use sunscreen. Apply sunscreen liberally and repeat throughout the day.  Protect yourself by wearing long sleeves, pants, a wide-brimmed hat, and sunglasses when outside.  What should  I know about heart disease, diabetes, and high blood pressure?  If you are 2-19 years of age, have your blood pressure checked every 3-5 years. If you are 85 years of age or older, have your blood pressure checked every year. You should have your blood pressure measured twice-once when you are at a hospital or clinic, and once when you are not at a hospital or clinic. Record the average of the two measurements. To check your blood pressure when you are not at a hospital or clinic, you can use: ? An  automated blood pressure machine at a pharmacy. ? A home blood pressure monitor.  Talk to your health care provider about your target blood pressure.  If you are between 31-7 years old, ask your health care provider if you should take aspirin to prevent heart disease.  Have regular diabetes screenings by checking your fasting blood sugar level. ? If you are at a normal weight and have a low risk for diabetes, have this test once every three years after the age of 89. ? If you are overweight and have a high risk for diabetes, consider being tested at a younger age or more often.  A one-time screening for abdominal aortic aneurysm (AAA) by ultrasound is recommended for men aged 57-75 years who are current or former smokers. What should I know about preventing infection? Hepatitis B If you have a higher risk for hepatitis B, you should be screened for this virus. Talk with your health care provider to find out if you are at risk for hepatitis B infection. Hepatitis C Blood testing is recommended for:  Everyone born from [birth year] through 1966.  Anyone with known risk factors for hepatitis C.  Sexually Transmitted Diseases (STDs)  You should be screened each year for STDs including gonorrhea and chlamydia if: ? You are sexually active and are younger than 73 years of age. ? You are older than 73 years of age and your health care provider tells you that you are at risk for this type of infection. ? Your sexual activity has changed since you were last screened and you are at an increased risk for chlamydia or gonorrhea. Ask your health care provider if you are at risk.  Talk with your health care provider about whether you are at high risk of being infected with HIV. Your health care provider may recommend a prescription medicine to help prevent HIV infection.  What else can I do?  Schedule regular health, dental, and eye exams.  Stay current with your vaccines (immunizations).  Do not use  any tobacco products, such as cigarettes, chewing tobacco, and e-cigarettes. If you need help quitting, ask your health care provider.  Limit alcohol intake to no more than 2 drinks per day. One drink equals 12 ounces of beer, 5 ounces of wine, or 1 ounces of hard liquor.  Do not use street drugs.  Do not share needles.  Ask your health care provider for help if you need support or information about quitting drugs.  Tell your health care provider if you often feel depressed.  Tell your health care provider if you have ever been abused or do not feel safe at home. This information is not intended to replace advice given to you by your health care provider. Make sure you discuss any questions you have with your health care provider. Document Released: 09/02/2007 Document Revised: 11/03/2015 Document Reviewed: 12/08/2014 Elsevier Interactive Patient Education  [employer].

## 2017-07-27 ENCOUNTER — Ambulatory Visit (INDEPENDENT_AMBULATORY_CARE_PROVIDER_SITE_OTHER): Payer: Medicare Other | Admitting: *Deleted

## 2017-07-27 ENCOUNTER — Ambulatory Visit (INDEPENDENT_AMBULATORY_CARE_PROVIDER_SITE_OTHER): Payer: Medicare Other | Admitting: Internal Medicine

## 2017-07-27 ENCOUNTER — Encounter: Payer: Self-pay | Admitting: Internal Medicine

## 2017-07-27 ENCOUNTER — Other Ambulatory Visit (INDEPENDENT_AMBULATORY_CARE_PROVIDER_SITE_OTHER): Payer: Medicare Other

## 2017-07-27 VITALS — Temp 98.2°F | Resp 17 | Ht 70.0 in | Wt 203.0 lb

## 2017-07-27 VITALS — BP 144/88 | HR 99 | Temp 98.2°F | Resp 16 | Wt 203.0 lb

## 2017-07-27 DIAGNOSIS — IMO0002 Reserved for concepts with insufficient information to code with codable children: Secondary | ICD-10-CM

## 2017-07-27 DIAGNOSIS — Z23 Encounter for immunization: Secondary | ICD-10-CM | POA: Diagnosis not present

## 2017-07-27 DIAGNOSIS — I10 Essential (primary) hypertension: Secondary | ICD-10-CM

## 2017-07-27 DIAGNOSIS — E538 Deficiency of other specified B group vitamins: Secondary | ICD-10-CM

## 2017-07-27 DIAGNOSIS — E782 Mixed hyperlipidemia: Secondary | ICD-10-CM

## 2017-07-27 DIAGNOSIS — E1165 Type 2 diabetes mellitus with hyperglycemia: Secondary | ICD-10-CM

## 2017-07-27 DIAGNOSIS — S72002S Fracture of unspecified part of neck of left femur, sequela: Secondary | ICD-10-CM

## 2017-07-27 DIAGNOSIS — Z0000 Encounter for general adult medical examination without abnormal findings: Secondary | ICD-10-CM

## 2017-07-27 DIAGNOSIS — E1140 Type 2 diabetes mellitus with diabetic neuropathy, unspecified: Secondary | ICD-10-CM

## 2017-07-27 LAB — CBC WITH DIFFERENTIAL/PLATELET
Basophils Absolute: 0.1 10*3/uL (ref 0.0–0.1)
Basophils Relative: 0.7 % (ref 0.0–3.0)
Eosinophils Absolute: 0.4 10*3/uL (ref 0.0–0.7)
Eosinophils Relative: 4.7 % (ref 0.0–5.0)
HCT: 37.2 % — ABNORMAL LOW (ref 39.0–52.0)
Hemoglobin: 12.6 g/dL — ABNORMAL LOW (ref 13.0–17.0)
Lymphocytes Relative: 25.1 % (ref 12.0–46.0)
Lymphs Abs: 1.9 10*3/uL (ref 0.7–4.0)
MCHC: 33.9 g/dL (ref 30.0–36.0)
MCV: 85.6 fl (ref 78.0–100.0)
Monocytes Absolute: 0.7 10*3/uL (ref 0.1–1.0)
Monocytes Relative: 8.7 % (ref 3.0–12.0)
Neutro Abs: 4.6 10*3/uL (ref 1.4–7.7)
Neutrophils Relative %: 60.8 % (ref 43.0–77.0)
Platelets: 207 10*3/uL (ref 150.0–400.0)
RBC: 4.34 Mil/uL (ref 4.22–5.81)
RDW: 14.2 % (ref 11.5–15.5)
WBC: 7.5 10*3/uL (ref 4.0–10.5)

## 2017-07-27 LAB — LIPID PANEL
Cholesterol: 163 mg/dL (ref 0–200)
HDL: 39.3 mg/dL (ref >39.00)
LDL Cholesterol: 94 mg/dL (ref 0–99)
NonHDL: 123.49
Total CHOL/HDL Ratio: 4
Triglycerides: 146 mg/dL (ref 0.0–149.0)
VLDL: 29.2 mg/dL (ref 0.0–40.0)

## 2017-07-27 LAB — COMPREHENSIVE METABOLIC PANEL
ALT: 16 U/L (ref 0–53)
AST: 20 U/L (ref 0–37)
Albumin: 4.2 g/dL (ref 3.5–5.2)
Alkaline Phosphatase: 53 U/L (ref 39–117)
BUN: 18 mg/dL (ref 6–23)
CO2: 30 mEq/L (ref 19–32)
Calcium: 9.8 mg/dL (ref 8.4–10.5)
Chloride: 106 mEq/L (ref 96–112)
Creatinine, Ser: 1.09 mg/dL (ref 0.40–1.50)
GFR: 70.58 mL/min (ref >60.00)
Glucose, Bld: 97 mg/dL (ref 70–99)
Potassium: 5 mEq/L (ref 3.5–5.1)
Sodium: 142 mEq/L (ref 135–145)
Total Bilirubin: 0.4 mg/dL (ref 0.2–1.2)
Total Protein: 6.9 g/dL (ref 6.0–8.3)

## 2017-07-27 LAB — TSH: TSH: 1.58 u[IU]/mL (ref 0.35–4.50)

## 2017-07-27 LAB — VITAMIN B12: Vitamin B-12: 190 pg/mL — ABNORMAL LOW (ref 211–911)

## 2017-07-27 LAB — HEMOGLOBIN A1C: Hgb A1c MFr Bld: 6.5 % (ref 4.6–6.5)

## 2017-07-27 NOTE — Patient Instructions (Addendum)
.Continue doing brain stimulating activities (puzzles, reading, adult coloring books, staying active) to keep memory sharp.   Continue to eat heart healthy diet (full of fruits, vegetables, whole grains, lean protein, water--limit salt, fat, and sugar intake) and increase physical activity as tolerated.   Donald Carlson , Thank you for taking time to come for your Medicare Wellness Visit. I appreciate your ongoing commitment to your health goals. Please review the following plan we discussed and let me know if I can assist you in the future.   These are the goals we discussed: Goals    . Patient Stated     Continue to go routinely to all of my doctors and follow what they say. Monitor my diet for fat, sugar, and carbohydrates and increase daily amount of water.       This is a list of the screening recommended for you and due dates:  Health Maintenance  Topic Date Due  . Tetanus Vaccine  02/06/2012  . Complete foot exam   11/27/2015  . Hemoglobin A1C  01/10/2017  . Pneumonia vaccines (2 of 2 - PPSV23) 07/11/2017  . Flu Shot  10/18/2017  . Eye exam for diabetics  07/05/2018  . Colon Cancer Screening  04/08/2022  .  Hepatitis C: One time screening is recommended by Center for Disease Control  (CDC) for  adults born from [birth year] through 1966.   Completed     Health Maintenance, Male A healthy lifestyle and preventive care is important for your health and wellness. Ask your health care provider about what schedule of regular examinations is right for you. What should I know about weight and diet? Eat a Healthy Diet  Eat plenty of vegetables, fruits, whole grains, low-fat dairy products, and lean protein.  Do not eat a lot of foods high in solid fats, added sugars, or salt.  Maintain a Healthy Weight Regular exercise can help you achieve or maintain a healthy weight. You should:  Do at least 150 minutes of exercise each week. The exercise should increase your heart rate and make you  sweat (moderate-intensity exercise).  Do strength-training exercises at least twice a week.  Watch Your Levels of Cholesterol and Blood Lipids  Have your blood tested for lipids and cholesterol every 5 years starting at 73 years of age. If you are at high risk for heart disease, you should start having your blood tested when you are 73 years old. You may need to have your cholesterol levels checked more often if: ? Your lipid or cholesterol levels are high. ? You are older than 73 years of age. ? You are at high risk for heart disease.  What should I know about cancer screening? Many types of cancers can be detected early and may often be prevented. Lung Cancer  You should be screened every year for lung cancer if: ? You are a current smoker who has smoked for at least 30 years. ? You are a former smoker who has quit within the past 15 years.  Talk to your health care provider about your screening options, when you should start screening, and how often you should be screened.  Colorectal Cancer  Routine colorectal cancer screening usually begins at 73 years of age and should be repeated every 5-10 years until you are 73 years old. You may need to be screened more often if early forms of precancerous polyps or small growths are found. Your health care provider may recommend screening at an earlier age if  you have risk factors for colon cancer.  Your health care provider may recommend using home test kits to check for hidden blood in the stool.  A small camera at the end of a tube can be used to examine your colon (sigmoidoscopy or colonoscopy). This checks for the earliest forms of colorectal cancer.  Prostate and Testicular Cancer  Depending on your age and overall health, your health care provider may do certain tests to screen for prostate and testicular cancer.  Talk to your health care provider about any symptoms or concerns you have about testicular or prostate cancer.  Skin  Cancer  Check your skin from head to toe regularly.  Tell your health care provider about any new moles or changes in moles, especially if: ? There is a change in a mole's size, shape, or color. ? You have a mole that is larger than a pencil eraser.  Always use sunscreen. Apply sunscreen liberally and repeat throughout the day.  Protect yourself by wearing long sleeves, pants, a wide-brimmed hat, and sunglasses when outside.  What should I know about heart disease, diabetes, and high blood pressure?  If you are 49-22 years of age, have your blood pressure checked every 3-5 years. If you are 40 years of age or older, have your blood pressure checked every year. You should have your blood pressure measured twice-once when you are at a hospital or clinic, and once when you are not at a hospital or clinic. Record the average of the two measurements. To check your blood pressure when you are not at a hospital or clinic, you can use: ? An automated blood pressure machine at a pharmacy. ? A home blood pressure monitor.  Talk to your health care provider about your target blood pressure.  If you are between 28-4 years old, ask your health care provider if you should take aspirin to prevent heart disease.  Have regular diabetes screenings by checking your fasting blood sugar level. ? If you are at a normal weight and have a low risk for diabetes, have this test once every three years after the age of 21. ? If you are overweight and have a high risk for diabetes, consider being tested at a younger age or more often.  A one-time screening for abdominal aortic aneurysm (AAA) by ultrasound is recommended for men aged 53-75 years who are current or former smokers. What should I know about preventing infection? Hepatitis B If you have a higher risk for hepatitis B, you should be screened for this virus. Talk with your health care provider to find out if you are at risk for hepatitis B  infection. Hepatitis C Blood testing is recommended for:  Everyone born from [birth year] through 1966.  Anyone with known risk factors for hepatitis C.  Sexually Transmitted Diseases (STDs)  You should be screened each year for STDs including gonorrhea and chlamydia if: ? You are sexually active and are younger than 73 years of age. ? You are older than 73 years of age and your health care provider tells you that you are at risk for this type of infection. ? Your sexual activity has changed since you were last screened and you are at an increased risk for chlamydia or gonorrhea. Ask your health care provider if you are at risk.  Talk with your health care provider about whether you are at high risk of being infected with HIV. Your health care provider may recommend a prescription medicine to help prevent  HIV infection.  What else can I do?  Schedule regular health, dental, and eye exams.  Stay current with your vaccines (immunizations).  Do not use any tobacco products, such as cigarettes, chewing tobacco, and e-cigarettes. If you need help quitting, ask your health care provider.  Limit alcohol intake to no more than 2 drinks per day. One drink equals 12 ounces of beer, 5 ounces of Jacquiline Zurcher, or 1 ounces of hard liquor.  Do not use street drugs.  Do not share needles.  Ask your health care provider for help if you need support or information about quitting drugs.  Tell your health care provider if you often feel depressed.  Tell your health care provider if you have ever been abused or do not feel safe at home. This information is not intended to replace advice given to you by your health care provider. Make sure you discuss any questions you have with your health care provider. Document Released: 09/02/2007 Document Revised: 11/03/2015 Document Reviewed: 12/08/2014 Elsevier Interactive Patient Education  [employer].

## 2017-07-27 NOTE — Assessment & Plan Note (Signed)
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

## 2017-07-27 NOTE — Assessment & Plan Note (Signed)
Blood pressure slightly elevated here today, but much better but controlled at home Continue current medications He will continue to monitor at home CMP, TSH, CBC
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
## 2017-07-27 NOTE — Assessment & Plan Note (Signed)
Check a1c Low sugar / carb diet Stressed regular exercise   

## 2017-07-27 NOTE — Assessment & Plan Note (Signed)
Check lipid panel  Continue daily statin Regular exercise and healthy diet encouraged  

## 2017-07-27 NOTE — Assessment & Plan Note (Signed)
Check B12 level. 

## 2017-07-29 ENCOUNTER — Encounter: Payer: Self-pay | Admitting: Internal Medicine

## 2017-09-14 ENCOUNTER — Other Ambulatory Visit: Payer: Self-pay | Admitting: Internal Medicine

## 2017-10-03 ENCOUNTER — Other Ambulatory Visit: Payer: Self-pay | Admitting: Internal Medicine

## 2017-10-03 LAB — FECAL OCCULT BLOOD, IMMUNOCHEMICAL: IFOBT: NEGATIVE

## 2017-10-04 ENCOUNTER — Other Ambulatory Visit: Payer: Self-pay | Admitting: Emergency Medicine

## 2017-10-04 MED ORDER — LISINOPRIL 20 MG PO TABS: 20.0000 mg | ORAL_TABLET | Freq: Two times a day (BID) | ORAL | 2 refills | Status: DC

## 2017-10-05 DIAGNOSIS — E119 Type 2 diabetes mellitus without complications: Secondary | ICD-10-CM | POA: Diagnosis not present

## 2017-10-05 DIAGNOSIS — H401232 Low-tension glaucoma, bilateral, moderate stage: Secondary | ICD-10-CM | POA: Diagnosis not present

## 2017-10-06 DIAGNOSIS — Z0000 Encounter for general adult medical examination without abnormal findings: Secondary | ICD-10-CM | POA: Diagnosis not present

## 2017-10-17 ENCOUNTER — Encounter: Payer: Self-pay | Admitting: Internal Medicine

## 2017-11-21 ENCOUNTER — Ambulatory Visit (INDEPENDENT_AMBULATORY_CARE_PROVIDER_SITE_OTHER): Payer: Self-pay

## 2017-11-21 ENCOUNTER — Encounter (INDEPENDENT_AMBULATORY_CARE_PROVIDER_SITE_OTHER): Payer: Self-pay | Admitting: Orthopaedic Surgery

## 2017-11-21 ENCOUNTER — Ambulatory Visit (INDEPENDENT_AMBULATORY_CARE_PROVIDER_SITE_OTHER): Payer: Medicare Other | Admitting: Orthopaedic Surgery

## 2017-11-21 DIAGNOSIS — M25552 Pain in left hip: Secondary | ICD-10-CM

## 2017-11-21 DIAGNOSIS — M5432 Sciatica, left side: Secondary | ICD-10-CM

## 2017-11-21 DIAGNOSIS — C672 Malignant neoplasm of lateral wall of bladder: Secondary | ICD-10-CM | POA: Diagnosis not present
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
## 2017-11-23 ENCOUNTER — Other Ambulatory Visit (INDEPENDENT_AMBULATORY_CARE_PROVIDER_SITE_OTHER): Payer: Self-pay

## 2017-11-23 DIAGNOSIS — M4807 Spinal stenosis, lumbosacral region: Secondary | ICD-10-CM

## 2017-11-27 ENCOUNTER — Encounter (INDEPENDENT_AMBULATORY_CARE_PROVIDER_SITE_OTHER): Payer: Self-pay | Admitting: Orthopaedic Surgery

## 2017-12-02 ENCOUNTER — Other Ambulatory Visit: Payer: Self-pay

## 2017-12-05 ENCOUNTER — Ambulatory Visit
Admission: RE | Admit: 2017-12-05 | Discharge: 2017-12-05 | Disposition: A | Discharge status: Home / Self Care | Payer: Medicare Other | Source: Ambulatory Visit | Attending: Orthopaedic Surgery | Admitting: Orthopaedic Surgery

## 2017-12-05 ENCOUNTER — Ambulatory Visit (INDEPENDENT_AMBULATORY_CARE_PROVIDER_SITE_OTHER): Payer: Medicare Other | Admitting: Orthopaedic Surgery

## 2017-12-05 DIAGNOSIS — M48061 Spinal stenosis, lumbar region without neurogenic claudication: Secondary | ICD-10-CM | POA: Diagnosis not present

## 2017-12-05 DIAGNOSIS — M4807 Spinal stenosis, lumbosacral region: Secondary | ICD-10-CM

## 2017-12-11 ENCOUNTER — Encounter (INDEPENDENT_AMBULATORY_CARE_PROVIDER_SITE_OTHER): Payer: Self-pay | Admitting: Orthopaedic Surgery

## 2017-12-11 ENCOUNTER — Ambulatory Visit (INDEPENDENT_AMBULATORY_CARE_PROVIDER_SITE_OTHER): Payer: Medicare Other | Admitting: Orthopaedic Surgery

## 2017-12-11 DIAGNOSIS — M25552 Pain in left hip: Secondary | ICD-10-CM

## 2017-12-11 DIAGNOSIS — M4807 Spinal stenosis, lumbosacral region: Secondary | ICD-10-CM | POA: Diagnosis not present

## 2017-12-11 NOTE — Progress Notes (Signed)
The patient comes in for follow-up after having an MRI of his lumbar spine.  He has a history of a decompression on his lumbar spine years ago and I believe L3-L4.  He was originally sent to me though for a second opinion as relates to previous left hip surgery.  Years ago he sustained a femoral neck fracture and underwent cannulated screw fixation.  That failed and eventually had a hemiarthroplasty.  He is also at one point had a bursectomy of his left hip prior to all that.  Eventually 1 of the other surgeons had to convert him from a hemiarthroplasty on the left side to a total hip replacement.  He still had problems ever since then in terms of pain in his hip.  However he points to his low back on the left side his thigh and all of his foot is a source of his pain.  On exam I can still easily put his left hip to internal extra rotation with no issues at all but he has a positive straight leg raise on the left side and it does affect things go down his leg.  He does walk with somewhat of a Trendelenburg gait so obviously he has atrophy of hip muscles that are probably permanently weak.  I sent in for the MRI because of the radicular symptoms that were starting in his back going all the way down to his leg.  These have been unchanged.  MRI that shows previous decompression surgery to the left side.  It appears that there may be some effects on the nerves at L2-L3 and L3-L4.  Metal loss of what else to try for him other than having Dr. Alfonse Spruce take a look at him and consider an epidural steroid injection at L2-L3 to left versus L3-L4 to left to see how this helps from a symptomatic standpoint.  I explained this in detail the patient he does wish to pursue this route given his chronic pain in this area.  We will work on setting up along with Dr. Ernestina Patches and I will see him back myself in about 4 weeks.

## 2017-12-12 ENCOUNTER — Encounter (INDEPENDENT_AMBULATORY_CARE_PROVIDER_SITE_OTHER): Payer: Self-pay | Admitting: Physical Medicine and Rehabilitation

## 2017-12-17 ENCOUNTER — Ambulatory Visit (INDEPENDENT_AMBULATORY_CARE_PROVIDER_SITE_OTHER): Payer: Medicare Other | Admitting: Orthopaedic Surgery

## 2017-12-19 ENCOUNTER — Ambulatory Visit (INDEPENDENT_AMBULATORY_CARE_PROVIDER_SITE_OTHER): Payer: Medicare Other | Admitting: Orthopaedic Surgery

## 2017-12-24 ENCOUNTER — Encounter (INDEPENDENT_AMBULATORY_CARE_PROVIDER_SITE_OTHER): Payer: Self-pay | Admitting: Physical Medicine and Rehabilitation

## 2017-12-24 ENCOUNTER — Ambulatory Visit (INDEPENDENT_AMBULATORY_CARE_PROVIDER_SITE_OTHER): Payer: Medicare Other | Admitting: Physical Medicine and Rehabilitation

## 2017-12-24 ENCOUNTER — Ambulatory Visit (INDEPENDENT_AMBULATORY_CARE_PROVIDER_SITE_OTHER): Payer: Self-pay

## 2017-12-24 VITALS — BP 156/81 | HR 61 | Temp 98.2°F

## 2017-12-24 DIAGNOSIS — M48061 Spinal stenosis, lumbar region without neurogenic claudication: Secondary | ICD-10-CM | POA: Diagnosis not present

## 2017-12-24 DIAGNOSIS — M5416 Radiculopathy, lumbar region: Secondary | ICD-10-CM | POA: Diagnosis not present

## 2017-12-24 MED ORDER — BETAMETHASONE SOD PHOS & ACET 6 (3-3) MG/ML IJ SUSP: 12.0000 mg | Freq: Once | INTRAMUSCULAR | Status: DC

## 2017-12-24 MED ORDER — BETAMETHASONE SOD PHOS & ACET 6 (3-3) MG/ML IJ SUSP
6.0000 mg | Freq: Once | INTRAMUSCULAR | Status: AC
  Administered 2017-12-24: 6 mg

## 2017-12-24 NOTE — Patient Instructions (Signed)

## 2017-12-24 NOTE — Progress Notes (Signed)
   Numeric Pain Rating Scale and Functional Assessment Average Pain 10   In the last MONTH (on 0-10 scale) has pain interfered with the following?  1. General activity like being  able to carry out your everyday physical activities such as walking, climbing stairs, carrying groceries, or moving a chair?  Rating(8)   +Driver, -BT, -Dye Allergies.  

## 2018-01-01 ENCOUNTER — Other Ambulatory Visit: Payer: Self-pay | Admitting: Internal Medicine

## 2018-01-04 DIAGNOSIS — H401232 Low-tension glaucoma, bilateral, moderate stage: Secondary | ICD-10-CM | POA: Diagnosis not present

## 2018-01-04 DIAGNOSIS — E119 Type 2 diabetes mellitus without complications: Secondary | ICD-10-CM | POA: Diagnosis not present

## 2018-01-07 NOTE — Procedures (Signed)
Lumbosacral Transforaminal Epidural Steroid Injection - Sub-Pedicular Approach with Fluoroscopic Guidance  Patient: Donald Carlson      Date of Birth: [birth year] MRN: [id number] PCP: Binnie Rail, MD      Visit Date: 12/24/2017   Universal Protocol:    Date/Time: 12/24/2017  Consent Given By: the patient  Position: PRONE  Additional Comments: Vital signs were monitored before and after the procedure. Patient was prepped and draped in the usual sterile fashion. The correct patient, procedure, and site was verified.   Injection Procedure Details:  Procedure Site One Meds Administered:  Meds ordered this encounter  Medications  . DISCONTD: betamethasone acetate-betamethasone sodium phosphate (CELESTONE) injection 12 mg  . betamethasone acetate-betamethasone sodium phosphate (CELESTONE) injection 6 mg    Laterality: Left  Location/Site:  L4-L5  Needle size: 22 G  Needle type: Spinal  Needle Placement: Transforaminal  Findings:    -Comments: Excellent flow of contrast along the nerve and into the epidural space.  Procedure Details: After squaring off the end-plates to get a true AP view, the C-arm was positioned so that an oblique view of the foramen as noted above was visualized. The target area is just inferior to the "nose of the scotty dog" or sub pedicular. The soft tissues overlying this structure were infiltrated with 2-3 ml. of 1% Lidocaine without Epinephrine.  The spinal needle was inserted toward the target using a "trajectory" view along the fluoroscope beam.  Under AP and lateral visualization, the needle was advanced so it did not puncture dura and was located close the 6 O'Clock position of the pedical in AP tracterory. Biplanar projections were used to confirm position. Aspiration was confirmed to be negative for CSF and/or blood. A 1-2 ml. volume of Isovue-250 was injected and flow of contrast was noted at each level. Radiographs were obtained for  documentation purposes.   After attaining the desired flow of contrast documented above, a 0.5 to 1.0 ml test dose of 0.25% Marcaine was injected into each respective transforaminal space.  The patient was observed for 90 seconds post injection.  After no sensory deficits were reported, and normal lower extremity motor function was noted,   the above injectate was administered so that equal amounts of the injectate were placed at each foramen (level) into the transforaminal epidural space.   Additional Comments:  The patient tolerated the procedure well Dressing: Band-Aid    Post-procedure details: Patient was observed during the procedure. Post-procedure instructions were reviewed.  Patient left the clinic in stable condition.

## 2018-01-07 NOTE — Progress Notes (Signed)
Donald Carlson - 72 y.o. male MRN [id number]  Date of birth: Jun 14, 1945  Office Visit Note: Visit Date: 12/24/2017 PCP: Binnie Rail, MD Referred by: Binnie Rail, MD  Subjective: Chief Complaint  Patient presents with  . Lower Back - Pain  . Left Hip - Pain  . Left Leg - Pain, Numbness  . Left Foot - Tingling   HPI:  Donald Carlson is a 72 y.o. male who comes in today For planned transforaminal epidural steroid injection on the left.  This is at the request of Dr. Jean Rosenthal who was previously evaluated the patient.  Please see his notes.  Patient is having back pain that refers into the left hip area with some groin pain in the left leg.  Symptoms are mostly consistent with an L4 distribution with some tingling numbness in the left leg and medial foot.  He reports this began in April 2014.  He has had prior surgical pinning of the left hip.  MRI of the lumbar spine shows previous lumbar discectomy laminectomy at L3-4 and L4-5.  There is some progressive endplate changes at Q6-8 but no focal nerve compression.  There is mild foraminal narrowing at L4 and mild foraminal and lateral recess narrowing L3-4.  Right-sided symptoms are predominant at the L2-3 level.  Patient has complication of diabetes.  We did discuss blood sugar elevation with the injection.  Patient's pain is 10 out of 10.  We did decide to complete left L4 transforaminal injection diagnostically.  Differential diagnosis may be iliopsoas bursitis.  ROS Otherwise per HPI.  Assessment & Plan: Visit Diagnoses:  1. Spinal stenosis of lumbar region, unspecified whether neurogenic claudication present   2. Lumbar radiculopathy     Plan: No additional findings.   Meds & Orders:  Meds ordered this encounter  Medications  . DISCONTD: betamethasone acetate-betamethasone sodium phosphate (CELESTONE) injection 12 mg  . betamethasone acetate-betamethasone sodium phosphate (CELESTONE) injection 6 mg    Orders  Placed This Encounter  Procedures  . XR C-ARM NO REPORT  . Epidural Steroid injection    Follow-up: Return if symptoms worsen or fail to improve.   Procedures: No procedures performed  Lumbosacral Transforaminal Epidural Steroid Injection - Sub-Pedicular Approach with Fluoroscopic Guidance  Patient: Donald Carlson      Date of Birth: 1945/07/06 MRN: [id number] PCP: Binnie Rail, MD      Visit Date: 12/24/2017   Universal Protocol:    Date/Time: 12/24/2017  Consent Given By: the patient  Position: PRONE  Additional Comments: Vital signs were monitored before and after the procedure. Patient was prepped and draped in the usual sterile fashion. The correct patient, procedure, and site was verified.   Injection Procedure Details:  Procedure Site One Meds Administered:  Meds ordered this encounter  Medications  . DISCONTD: betamethasone acetate-betamethasone sodium phosphate (CELESTONE) injection 12 mg  . betamethasone acetate-betamethasone sodium phosphate (CELESTONE) injection 6 mg    Laterality: Left  Location/Site:  L4-L5  Needle size: 22 G  Needle type: Spinal  Needle Placement: Transforaminal  Findings:    -Comments: Excellent flow of contrast along the nerve and into the epidural space.  Procedure Details: After squaring off the end-plates to get a true AP view, the C-arm was positioned so that an oblique view of the foramen as noted above was visualized. The target area is just inferior to the "nose of the scotty dog" or sub pedicular. The soft tissues overlying this structure were infiltrated with  2-3 ml. of 1% Lidocaine without Epinephrine.  The spinal needle was inserted toward the target using a "trajectory" view along the fluoroscope beam.  Under AP and lateral visualization, the needle was advanced so it did not puncture dura and was located close the 6 O'Clock position of the pedical in AP tracterory. Biplanar projections were used to confirm  position. Aspiration was confirmed to be negative for CSF and/or blood. A 1-2 ml. volume of Isovue-250 was injected and flow of contrast was noted at each level. Radiographs were obtained for documentation purposes.   After attaining the desired flow of contrast documented above, a 0.5 to 1.0 ml test dose of 0.25% Marcaine was injected into each respective transforaminal space.  The patient was observed for 90 seconds post injection.  After no sensory deficits were reported, and normal lower extremity motor function was noted,   the above injectate was administered so that equal amounts of the injectate were placed at each foramen (level) into the transforaminal epidural space.   Additional Comments:  The patient tolerated the procedure well Dressing: Band-Aid    Post-procedure details: Patient was observed during the procedure. Post-procedure instructions were reviewed.  Patient left the clinic in stable condition.     Clinical History: MRI LUMBAR SPINE WITHOUT CONTRAST  TECHNIQUE: Multiplanar, multisequence MR imaging of the lumbar spine was performed. No intravenous contrast was administered.  COMPARISON:  03/04/2010  FINDINGS: Segmentation:  Standard.  Alignment:  3 mm retrolisthesis of L3 on L4, new or increased.  Vertebrae: Mild chronic anterior wedge deformities of the T12 and L1 vertebral bodies, unchanged. No new fracture or suspicious osseous lesion. New mild degenerative endplate edema at G8-6. New chronic degenerative endplate changes at P6-1.  Conus medullaris and cauda equina: Conus extends to the lower L1 level. Conus and cauda equina appear normal.  Paraspinal and other soft tissues: 4 cm partially visualized right renal cysts including a 4 cm cyst medially, enlarged from prior.  Disc levels:  Disc desiccation from L1-2 to L4-5. Mild disc space narrowing at L1-2 and L4-5. Progressive, moderate disc space narrowing at L2-3 and moderate to severe  narrowing at L3-4.  T12-L1: Negative.  L1-2: Mild disc bulging and small right subarticular to foraminal disc protrusion result in borderline right lateral recess and borderline right neural foraminal stenosis. The right subarticular disc protrusion has regressed since the prior study with improved right lateral recess patency. No spinal stenosis.  L2-3: Circumferential disc bulging, endplate spurring, and moderate facet and ligamentum flavum hypertrophy result in mild spinal stenosis, mild left lateral recess stenosis, and borderline bilateral neural foraminal stenosis, progressed from prior.  L3-4: Interval posterior decompression. No significant residual spinal stenosis. Circumferential disc bulging, endplate spurring, progressive disc space narrowing, and moderate facet hypertrophy result in mild bilateral neural foraminal stenosis.  L4-5: Prior posterior decompression without significant spinal stenosis. Disc bulging, endplate spurring, and moderate facet hypertrophy result in unchanged mild bilateral neural foraminal stenosis.  L5-S1: Normal disc. Moderate right greater than left facet arthrosis without stenosis, unchanged.  IMPRESSION: 1. Progressive L3-4 disc degeneration following interval posterior decompression. Mild bilateral neural foraminal stenosis without residual spinal stenosis. 2. Progressive L2-3 disc degeneration with mild spinal stenosis. 3. Unchanged mild bilateral neural foraminal stenosis at L4-5.   Electronically Signed   By: Logan Bores M.D.   On: 12/05/2017 16:54     Objective:  VS:  HT:    WT:   BMI:     BP:(!) 156/81  HR:61bpm  TEMP:98.2 F (  36.8 C)(Oral)  RESP:  Physical Exam  Ortho Exam Imaging: No results found.

## 2018-01-08 ENCOUNTER — Other Ambulatory Visit (INDEPENDENT_AMBULATORY_CARE_PROVIDER_SITE_OTHER): Payer: Self-pay

## 2018-01-08 ENCOUNTER — Ambulatory Visit (INDEPENDENT_AMBULATORY_CARE_PROVIDER_SITE_OTHER): Payer: Medicare Other | Admitting: Orthopaedic Surgery

## 2018-01-08 ENCOUNTER — Encounter (INDEPENDENT_AMBULATORY_CARE_PROVIDER_SITE_OTHER): Payer: Self-pay | Admitting: Orthopaedic Surgery

## 2018-01-08 DIAGNOSIS — M5442 Lumbago with sciatica, left side: Secondary | ICD-10-CM | POA: Diagnosis not present

## 2018-01-08 DIAGNOSIS — G8929 Other chronic pain: Secondary | ICD-10-CM | POA: Diagnosis not present

## 2018-01-08 DIAGNOSIS — M79605 Pain in left leg: Secondary | ICD-10-CM
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
## 2018-01-10 ENCOUNTER — Encounter: Payer: Self-pay | Admitting: Neurology

## 2018-01-10 ENCOUNTER — Telehealth (INDEPENDENT_AMBULATORY_CARE_PROVIDER_SITE_OTHER): Payer: Self-pay | Admitting: Orthopaedic Surgery

## 2018-01-10 NOTE — Telephone Encounter (Signed)
Can we change her info on her referral to send her info to Dr. Joya Salm?

## 2018-01-10 NOTE — Telephone Encounter (Signed)
Patients wife said patient was referred to wrong office, he is currently scheduled with Dr. Posey Pronto for his left leg but did not want to go to [facility]. Patient requests to be referred to [facility]: Dr. Joya Salm. He had done surgery on patient before. They said they will cancel other appointment. Please advise when done #[phone]

## 2018-01-11 NOTE — Telephone Encounter (Signed)
Referral faxed to [facility], Dr. Joya Salm has retired from this practice.

## 2018-01-24 DIAGNOSIS — M549 Dorsalgia, unspecified: Secondary | ICD-10-CM | POA: Diagnosis not present

## 2018-01-24 DIAGNOSIS — Z6828 Body mass index (BMI) 28.0-28.9, adult: Secondary | ICD-10-CM | POA: Diagnosis not present

## 2018-01-28 ENCOUNTER — Ambulatory Visit: Payer: Self-pay | Admitting: Internal Medicine

## 2018-01-28 DIAGNOSIS — D649 Anemia, unspecified: Secondary | ICD-10-CM | POA: Insufficient documentation
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
## 2018-01-28 NOTE — Patient Instructions (Addendum)
Make sure you are taking vitamin B 12 1000 mcg daily.   Tests ordered today. Your results will be released to [place] (or called to you) after review, usually within 72hours after test completion. If any changes need to be made, you will be notified at that same time.   Medications reviewed and updated.  Changes include :   Stop the actos, start [place] once a day.   Your prescription(s) have been submitted to your pharmacy. Please take as directed and contact our office if you believe you are having problem(s) with the medication(s).   Please followup in 6 months

## 2018-01-30 ENCOUNTER — Ambulatory Visit: Payer: Medicare Other | Admitting: Internal Medicine

## 2018-01-30 ENCOUNTER — Other Ambulatory Visit (INDEPENDENT_AMBULATORY_CARE_PROVIDER_SITE_OTHER): Payer: Medicare Other

## 2018-01-30 ENCOUNTER — Encounter: Payer: Self-pay | Admitting: Internal Medicine

## 2018-01-30 VITALS — BP 162/84 | HR 60 | Temp 98.3°F | Resp 16 | Ht 70.0 in | Wt 200.0 lb

## 2018-01-30 DIAGNOSIS — E1165 Type 2 diabetes mellitus with hyperglycemia: Secondary | ICD-10-CM

## 2018-01-30 DIAGNOSIS — E538 Deficiency of other specified B group vitamins: Secondary | ICD-10-CM | POA: Diagnosis not present

## 2018-01-30 DIAGNOSIS — D649 Anemia, unspecified: Secondary | ICD-10-CM

## 2018-01-30 DIAGNOSIS — E1140 Type 2 diabetes mellitus with diabetic neuropathy, unspecified: Secondary | ICD-10-CM | POA: Diagnosis not present

## 2018-01-30 DIAGNOSIS — E782 Mixed hyperlipidemia: Secondary | ICD-10-CM

## 2018-01-30 DIAGNOSIS — I10 Essential (primary) hypertension: Secondary | ICD-10-CM

## 2018-01-30 DIAGNOSIS — IMO0002 Reserved for concepts with insufficient information to code with codable children: Secondary | ICD-10-CM

## 2018-01-30 LAB — CBC WITH DIFFERENTIAL/PLATELET
Basophils Absolute: 0.1 10*3/uL (ref 0.0–0.1)
Basophils Relative: 0.7 % (ref 0.0–3.0)
Eosinophils Absolute: 0.3 10*3/uL (ref 0.0–0.7)
Eosinophils Relative: 4.5 % (ref 0.0–5.0)
HCT: 37.3 % — ABNORMAL LOW (ref 39.0–52.0)
Hemoglobin: 12.8 g/dL — ABNORMAL LOW (ref 13.0–17.0)
Lymphocytes Relative: 23.6 % (ref 12.0–46.0)
Lymphs Abs: 1.7 10*3/uL (ref 0.7–4.0)
MCHC: 34.3 g/dL (ref 30.0–36.0)
MCV: 86.4 fl (ref 78.0–100.0)
Monocytes Absolute: 0.7 10*3/uL (ref 0.1–1.0)
Monocytes Relative: 9.4 % (ref 3.0–12.0)
Neutro Abs: 4.4 10*3/uL (ref 1.4–7.7)
Neutrophils Relative %: 61.8 % (ref 43.0–77.0)
Platelets: 220 10*3/uL (ref 150.0–400.0)
RBC: 4.32 Mil/uL (ref 4.22–5.81)
RDW: 13.6 % (ref 11.5–15.5)
WBC: 7.1 10*3/uL (ref 4.0–10.5)

## 2018-01-30 LAB — LIPID PANEL
Cholesterol: 160 mg/dL (ref 0–200)
HDL: 41.6 mg/dL (ref >39.00)
LDL Cholesterol: 93 mg/dL (ref 0–99)
NonHDL: 118.55
Total CHOL/HDL Ratio: 4
Triglycerides: 127 mg/dL (ref 0.0–149.0)
VLDL: 25.4 mg/dL (ref 0.0–40.0)

## 2018-01-30 LAB — COMPREHENSIVE METABOLIC PANEL
ALT: 15 U/L (ref 0–53)
AST: 20 U/L (ref 0–37)
Albumin: 4.4 g/dL (ref 3.5–5.2)
Alkaline Phosphatase: 59 U/L (ref 39–117)
BUN: 22 mg/dL (ref 6–23)
CO2: 29 mEq/L (ref 19–32)
Calcium: 9.6 mg/dL (ref 8.4–10.5)
Chloride: 105 mEq/L (ref 96–112)
Creatinine, Ser: 1.11 mg/dL (ref 0.40–1.50)
GFR: 69.01 mL/min (ref >60.00)
Glucose, Bld: 122 mg/dL — ABNORMAL HIGH (ref 70–99)
Potassium: 4.3 mEq/L (ref 3.5–5.1)
Sodium: 140 mEq/L (ref 135–145)
Total Bilirubin: 0.6 mg/dL (ref 0.2–1.2)
Total Protein: 7.1 g/dL (ref 6.0–8.3)

## 2018-01-30 LAB — FERRITIN: Ferritin: 40.1 ng/mL (ref 22.0–322.0)

## 2018-01-30 LAB — IRON: Iron: 64 ug/dL (ref 42–165)

## 2018-01-30 LAB — HEMOGLOBIN A1C: Hgb A1c MFr Bld: 6.5 % (ref 4.6–6.5)

## 2018-01-30 LAB — VITAMIN B12: Vitamin B-12: 398 pg/mL (ref 211–911)

## 2018-01-30 MED ORDER — SITAGLIPTIN PHOSPHATE 100 MG PO TABS: 100.0000 mg | ORAL_TABLET | Freq: Every day | ORAL | 1 refills | Status: DC

## 2018-01-30 NOTE — Assessment & Plan Note (Signed)
Check lipid panel  Continue daily statin Regular exercise and healthy diet encouraged  

## 2018-01-30 NOTE — Assessment & Plan Note (Signed)
Overall blood pressure seems to be well controlled at home Continue current medications Continue to monitor Recommended regular exercise CMP

## 2018-01-30 NOTE — Assessment & Plan Note (Signed)
Encourage regular exercise Sugars at home ranging from 60s-130s He is asymptomatic with the lower sugars, which are concern Will discontinue Actos Start Januvia 100 mg instead Continue metformin at current dose A1c today Follow-up in 6 months

## 2018-01-30 NOTE — Assessment & Plan Note (Addendum)
Mild anemia in the past CBC, iron, ferritin

## 2018-01-30 NOTE — Assessment & Plan Note (Addendum)
?   taking B12 daily Check level Advised taking 1000 mcg of vitamin B12 daily

## 2018-01-31 ENCOUNTER — Encounter: Payer: Self-pay | Admitting: Internal Medicine

## 2018-02-19 DIAGNOSIS — D1801 Hemangioma of skin and subcutaneous tissue: Secondary | ICD-10-CM | POA: Diagnosis not present

## 2018-02-19 DIAGNOSIS — D229 Melanocytic nevi, unspecified: Secondary | ICD-10-CM | POA: Diagnosis not present

## 2018-02-19 DIAGNOSIS — L821 Other seborrheic keratosis: Secondary | ICD-10-CM | POA: Diagnosis not present

## 2018-02-19 DIAGNOSIS — L814 Other melanin hyperpigmentation: Secondary | ICD-10-CM | POA: Diagnosis not present

## 2018-03-29 ENCOUNTER — Other Ambulatory Visit: Payer: Self-pay | Admitting: Internal Medicine

## 2018-04-01 ENCOUNTER — Other Ambulatory Visit: Payer: Self-pay | Admitting: Internal Medicine

## 2018-04-05 ENCOUNTER — Ambulatory Visit: Payer: Self-pay | Admitting: Neurology

## 2018-04-12 DIAGNOSIS — E119 Type 2 diabetes mellitus without complications: Secondary | ICD-10-CM | POA: Diagnosis not present

## 2018-04-12 DIAGNOSIS — H401232 Low-tension glaucoma, bilateral, moderate stage: Secondary | ICD-10-CM | POA: Diagnosis not present
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
## 2018-04-23 ENCOUNTER — Encounter: Payer: Self-pay | Admitting: Internal Medicine

## 2018-04-23 ENCOUNTER — Ambulatory Visit: Payer: Medicare Other | Admitting: Internal Medicine

## 2018-04-23 VITALS — BP 156/76 | HR 61 | Temp 98.3°F | Resp 16 | Ht 70.0 in | Wt 200.1 lb

## 2018-04-23 DIAGNOSIS — I10 Essential (primary) hypertension: Secondary | ICD-10-CM | POA: Diagnosis not present

## 2018-04-23 DIAGNOSIS — H9193 Unspecified hearing loss, bilateral: Secondary | ICD-10-CM | POA: Insufficient documentation

## 2018-04-23 NOTE — Patient Instructions (Signed)
Your ear look normal.    A referral was ordered for ENT - Dr Thornell Mule.

## 2018-04-23 NOTE — Assessment & Plan Note (Signed)
Blood pressure consistently elevated here He does check at home, but not consistently He did not take his medication today He will start monitoring his blood pressure more frequently and when he follows up next if it is elevated he will need medication adjustment

## 2018-04-23 NOTE — Assessment & Plan Note (Signed)
He does have some hearing problems.  Ear exam normal He is interested in a referral to an ENT-would like to see Dr. Kara Mead he may not be taking new patients, but will place referral and will find a different provider if needed

## 2018-04-30 ENCOUNTER — Ambulatory Visit: Payer: Self-pay | Admitting: Internal Medicine

## 2018-05-13 ENCOUNTER — Telehealth: Payer: Self-pay

## 2018-05-13 NOTE — Telephone Encounter (Signed)
I did not know if you wanted to try to refer him to someone else.

## 2018-05-13 NOTE — Telephone Encounter (Signed)
Copied from [place] [id number]. Topic: Referral - Status >> May 13, 2018  9:36 AM Ivar Drape wrote: Reason for CRM: Dorian Pod with the [facility] wanted the office to know that they are closing the office for good effective 07/18/2018.  So they are not taking any new patients, and will not be able to see this patient.

## 2018-05-13 NOTE — Telephone Encounter (Signed)
Pt has been referred elsewhere

## 2018-05-24 DIAGNOSIS — H9313 Tinnitus, bilateral: Secondary | ICD-10-CM | POA: Diagnosis not present

## 2018-05-24 DIAGNOSIS — H903 Sensorineural hearing loss, bilateral: Secondary | ICD-10-CM | POA: Diagnosis not present

## 2018-06-30 ENCOUNTER — Other Ambulatory Visit: Payer: Self-pay | Admitting: Internal Medicine

## 2018-07-30 NOTE — Progress Notes (Signed)
Virtual Visit via Telephone Note  I connected with Donald Carlson on 07/31/18 at  8:30 AM EDT by a telephone  and verified that I am speaking with the correct person using two identifiers.   I discussed the limitations of evaluation and management by telemedicine and the availability of in person appointments. The patient expressed understanding and agreed to proceed.  The patient is currently at home and I am in the office.  Him and his wife were on the call with me.   No referring provider.    Amount of time spent talking on the phone for this visit: 10 minutes   History of Present Illness: He is here for follow up of his chronic medical conditions.    He is active at work, but not exercising regularly.    Hypertension: He is taking his medication daily. He is compliant with a low sodium diet.  He denies chest pain, palpitations, shortness of breath and regular headaches.  He does monitor his blood pressure at home.    Diabetes: He is taking his medication daily as prescribed. He is not compliant compliant with a diabetic diet.  He monitors his sugars and they have been running 115 in morning, at noon after eating 111, and 142 at night.  He is up-to-date with an ophthalmology examination.   Hyperlipidemia: He is taking his medication daily. He is compliant with a low fat/cholesterol diet. He denies myalgias.   B12 deficiency:  He is taking B12 daily.      Review of Systems  Constitutional: Negative for chills and fever.  Respiratory: Negative for cough, shortness of breath and wheezing.   Cardiovascular: Positive for leg swelling. Negative for chest pain and palpitations.  Neurological: Negative for dizziness and headaches.      Social History   Socioeconomic History  . Marital status: Married    Spouse name: Not on file  . Number of children: 1  . Years of education: Not on file  . Highest education level: Not on file  Occupational History  . Not on file  Social  Needs  . Financial resource strain: Not hard at all  . Food insecurity:    Worry: Never true    Inability: Never true  . Transportation needs:    Medical: No    Non-medical: No  Tobacco Use  . Smoking status: Never Smoker  . Smokeless tobacco: Never Used  Substance and Sexual Activity  . Alcohol use: No  . Drug use: No  . Sexual activity: Not Currently  Lifestyle  . Physical activity:    Days per week: 0 days    Minutes per session: 0 min  . Stress: Not at all  Relationships  . Social connections:    Talks on phone: More than three times a week    Gets together: More than three times a week    Attends religious service: 1 to 4 times per year    Active member of club or organization: No    Attends meetings of clubs or organizations: Never    Relationship status: Married  Other Topics Concern  . Not on file  Social History Narrative   No regular exercise - physical activity limited by left leg pain     Observations/Objective:  Sugars as above BP 170/90 in morning at noon 147/79 and at night 139/73  Assessment and Plan:  See Problem List for Assessment and Plan of chronic medical problems.   Follow Up Instructions:  I discussed the assessment and treatment plan with the patient. The patient was provided an opportunity to ask questions and all were answered. The patient agreed with the plan and demonstrated an understanding of the instructions.   The patient was advised to call back or seek an in-person evaluation if the symptoms worsen or if the condition fails to improve as anticipated.  FU in 6 months - will get labs at that time  Binnie Rail, MD

## 2018-07-31 ENCOUNTER — Encounter: Payer: Self-pay | Admitting: Internal Medicine

## 2018-07-31 ENCOUNTER — Ambulatory Visit (INDEPENDENT_AMBULATORY_CARE_PROVIDER_SITE_OTHER): Payer: Medicare Other | Admitting: Internal Medicine

## 2018-07-31 DIAGNOSIS — I10 Essential (primary) hypertension: Secondary | ICD-10-CM

## 2018-07-31 DIAGNOSIS — E1140 Type 2 diabetes mellitus with diabetic neuropathy, unspecified: Secondary | ICD-10-CM | POA: Diagnosis not present

## 2018-07-31 DIAGNOSIS — E538 Deficiency of other specified B group vitamins: Secondary | ICD-10-CM

## 2018-07-31 DIAGNOSIS — E1165 Type 2 diabetes mellitus with hyperglycemia: Secondary | ICD-10-CM

## 2018-07-31 DIAGNOSIS — IMO0002 Reserved for concepts with insufficient information to code with codable children: Secondary | ICD-10-CM

## 2018-07-31 DIAGNOSIS — E782 Mixed hyperlipidemia: Secondary | ICD-10-CM

## 2018-07-31 MED ORDER — LISINOPRIL 20 MG PO TABS: 20.0000 mg | ORAL_TABLET | Freq: Two times a day (BID) | ORAL | 2 refills | Status: DC

## 2018-07-31 MED ORDER — LISINOPRIL 20 MG PO TABS: 40.0000 mg | ORAL_TABLET | Freq: Every day | ORAL | 2 refills | Status: DC

## 2018-07-31 NOTE — Assessment & Plan Note (Signed)
Sugars well controlled at home Continue current medication at current doses a1c

## 2018-07-31 NOTE — Assessment & Plan Note (Signed)
BP not controlled Not taking amlodipine and only taking lisinopril once Increase lisinopril to 20 mg twice daily Continue to monitor BP at home cmp

## 2018-07-31 NOTE — Assessment & Plan Note (Signed)
Continue B12 daily

## 2018-07-31 NOTE — Assessment & Plan Note (Signed)
Continue statin. 

## 2018-08-02 ENCOUNTER — Other Ambulatory Visit (INDEPENDENT_AMBULATORY_CARE_PROVIDER_SITE_OTHER): Payer: Medicare Other

## 2018-08-02 DIAGNOSIS — E1165 Type 2 diabetes mellitus with hyperglycemia: Secondary | ICD-10-CM

## 2018-08-02 DIAGNOSIS — I10 Essential (primary) hypertension: Secondary | ICD-10-CM

## 2018-08-02 DIAGNOSIS — E1140 Type 2 diabetes mellitus with diabetic neuropathy, unspecified: Secondary | ICD-10-CM | POA: Diagnosis not present

## 2018-08-02 DIAGNOSIS — IMO0002 Reserved for concepts with insufficient information to code with codable children: Secondary | ICD-10-CM

## 2018-08-02 LAB — COMPREHENSIVE METABOLIC PANEL
ALT: 16 U/L (ref 0–53)
AST: 18 U/L (ref 0–37)
Albumin: 4.2 g/dL (ref 3.5–5.2)
Alkaline Phosphatase: 57 U/L (ref 39–117)
BUN: 24 mg/dL — ABNORMAL HIGH (ref 6–23)
CO2: 25 mEq/L (ref 19–32)
Calcium: 9.3 mg/dL (ref 8.4–10.5)
Chloride: 106 mEq/L (ref 96–112)
Creatinine, Ser: 1.15 mg/dL (ref 0.40–1.50)
GFR: 62.24 mL/min (ref >60.00)
Glucose, Bld: 137 mg/dL — ABNORMAL HIGH (ref 70–99)
Potassium: 4.7 mEq/L (ref 3.5–5.1)
Sodium: 140 mEq/L (ref 135–145)
Total Bilirubin: 0.5 mg/dL (ref 0.2–1.2)
Total Protein: 6.9 g/dL (ref 6.0–8.3)

## 2018-08-02 LAB — HEMOGLOBIN A1C: Hgb A1c MFr Bld: 6.4 % (ref 4.6–6.5)

## 2018-08-03 ENCOUNTER — Encounter: Payer: Self-pay | Admitting: Internal Medicine

## 2018-08-04 ENCOUNTER — Encounter: Payer: Self-pay | Admitting: Internal Medicine

## 2018-08-06 ENCOUNTER — Encounter: Payer: Self-pay | Admitting: Internal Medicine

## 2018-08-07 MED ORDER — LISINOPRIL 30 MG PO TABS: 30.0000 mg | ORAL_TABLET | Freq: Every day | ORAL | 1 refills | Status: DC

## 2018-08-16 DIAGNOSIS — H2513 Age-related nuclear cataract, bilateral: Secondary | ICD-10-CM | POA: Diagnosis not present

## 2018-08-16 DIAGNOSIS — E119 Type 2 diabetes mellitus without complications: Secondary | ICD-10-CM | POA: Diagnosis not present

## 2018-08-16 LAB — HM DIABETES EYE EXAM

## 2018-08-27 ENCOUNTER — Encounter: Payer: Self-pay | Admitting: Internal Medicine

## 2018-09-28 ENCOUNTER — Other Ambulatory Visit: Payer: Self-pay | Admitting: Internal Medicine

## 2018-09-30 ENCOUNTER — Ambulatory Visit (INDEPENDENT_AMBULATORY_CARE_PROVIDER_SITE_OTHER)
Admission: RE | Admit: 2018-09-30 | Discharge: 2018-09-30 | Disposition: A | Discharge status: Home / Self Care | Payer: Medicare Other | Source: Ambulatory Visit | Attending: Internal Medicine | Admitting: Internal Medicine

## 2018-09-30 ENCOUNTER — Other Ambulatory Visit (INDEPENDENT_AMBULATORY_CARE_PROVIDER_SITE_OTHER): Payer: Medicare Other

## 2018-09-30 ENCOUNTER — Other Ambulatory Visit: Payer: Self-pay

## 2018-09-30 ENCOUNTER — Encounter: Payer: Self-pay | Admitting: Internal Medicine

## 2018-09-30 ENCOUNTER — Ambulatory Visit (INDEPENDENT_AMBULATORY_CARE_PROVIDER_SITE_OTHER): Payer: Medicare Other | Admitting: Internal Medicine

## 2018-09-30 VITALS — BP 142/80 | HR 62 | Temp 98.8°F | Resp 16 | Ht 70.0 in | Wt 191.0 lb

## 2018-09-30 DIAGNOSIS — R1013 Epigastric pain: Secondary | ICD-10-CM

## 2018-09-30 DIAGNOSIS — R634 Abnormal weight loss: Secondary | ICD-10-CM | POA: Insufficient documentation

## 2018-09-30 LAB — COMPREHENSIVE METABOLIC PANEL
ALT: 16 U/L (ref 0–53)
AST: 17 U/L (ref 0–37)
Albumin: 4.5 g/dL (ref 3.5–5.2)
Alkaline Phosphatase: 58 U/L (ref 39–117)
BUN: 20 mg/dL (ref 6–23)
CO2: 27 mEq/L (ref 19–32)
Calcium: 9.6 mg/dL (ref 8.4–10.5)
Chloride: 107 mEq/L (ref 96–112)
Creatinine, Ser: 1.04 mg/dL (ref 0.40–1.50)
GFR: 69.87 mL/min (ref >60.00)
Glucose, Bld: 99 mg/dL (ref 70–99)
Potassium: 4.4 mEq/L (ref 3.5–5.1)
Sodium: 141 mEq/L (ref 135–145)
Total Bilirubin: 0.4 mg/dL (ref 0.2–1.2)
Total Protein: 7.2 g/dL (ref 6.0–8.3)

## 2018-09-30 LAB — CBC WITH DIFFERENTIAL/PLATELET
Basophils Absolute: 0 10*3/uL (ref 0.0–0.1)
Basophils Relative: 0.6 % (ref 0.0–3.0)
Eosinophils Absolute: 0.3 10*3/uL (ref 0.0–0.7)
Eosinophils Relative: 3.4 % (ref 0.0–5.0)
HCT: 36.3 % — ABNORMAL LOW (ref 39.0–52.0)
Hemoglobin: 12.2 g/dL — ABNORMAL LOW (ref 13.0–17.0)
Lymphocytes Relative: 27.8 % (ref 12.0–46.0)
Lymphs Abs: 2.1 10*3/uL (ref 0.7–4.0)
MCHC: 33.7 g/dL (ref 30.0–36.0)
MCV: 85.8 fl (ref 78.0–100.0)
Monocytes Absolute: 0.7 10*3/uL (ref 0.1–1.0)
Monocytes Relative: 9.1 % (ref 3.0–12.0)
Neutro Abs: 4.5 10*3/uL (ref 1.4–7.7)
Neutrophils Relative %: 59.1 % (ref 43.0–77.0)
Platelets: 205 10*3/uL (ref 150.0–400.0)
RBC: 4.24 Mil/uL (ref 4.22–5.81)
RDW: 13.1 % (ref 11.5–15.5)
WBC: 7.6 10*3/uL (ref 4.0–10.5)

## 2018-09-30 LAB — LIPASE: Lipase: 37 U/L (ref 11.0–59.0)

## 2018-09-30 LAB — AMYLASE: Amylase: 42 U/L (ref 27–131)

## 2018-09-30 NOTE — Assessment & Plan Note (Addendum)
Epigastric pain with eating and on exam H/o SBO No GERD, no blood in stool Concern for mesenteric ischemia, SBO, gastritis/ulcer Check cbc, cmp, amylase, lipase Abdominal xray to r/o SBO today CT Abdomen with contrast ordered Depending on results will refer to GI Will hold off on PPI - has had some GERD in the past but denies GERD, dysphagia

## 2018-09-30 NOTE — Assessment & Plan Note (Signed)
Has lost weight - 9 lbs since he was here last - he thinks it occurred in the past month He is eating less which is likely the cause He will monitor at home - will evaluate cause of abdominal pain
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
## 2018-09-30 NOTE — Patient Instructions (Addendum)
Have blood work and an Insurance account manager done today.  Tests ordered today. Your results will be released to [facility] (or called to you) after review.  If any changes need to be made, you will be notified at that same time.  I will order a CT scan of your abdomen.   Medications reviewed and updated.  Changes include :   none

## 2018-10-01 ENCOUNTER — Ambulatory Visit
Admission: RE | Admit: 2018-10-01 | Discharge: 2018-10-01 | Disposition: A | Discharge status: Home / Self Care | Payer: Medicare Other | Source: Ambulatory Visit | Attending: Internal Medicine | Admitting: Internal Medicine

## 2018-10-01 ENCOUNTER — Inpatient Hospital Stay: Admission: RE | Admit: 2018-10-01 | Payer: Medicare Other | Source: Ambulatory Visit

## 2018-10-01 DIAGNOSIS — N281 Cyst of kidney, acquired: Secondary | ICD-10-CM | POA: Diagnosis not present

## 2018-10-01 DIAGNOSIS — R634 Abnormal weight loss: Secondary | ICD-10-CM

## 2018-10-01 DIAGNOSIS — R1013 Epigastric pain: Secondary | ICD-10-CM

## 2018-10-01 DIAGNOSIS — K8020 Calculus of gallbladder without cholecystitis without obstruction: Secondary | ICD-10-CM | POA: Diagnosis not present

## 2018-10-01 MED ORDER — IOPAMIDOL (ISOVUE-300) INJECTION 61%
100.0000 mL | Freq: Once | INTRAVENOUS | Status: AC | PRN
  Administered 2018-10-01: 100 mL via INTRAVENOUS

## 2018-10-02 ENCOUNTER — Telehealth: Payer: Self-pay | Admitting: Internal Medicine

## 2018-10-02 NOTE — Telephone Encounter (Signed)
Copied from [place] [id number]. Topic: Quick Communication - Lab Results (Clinic Use ONLY) >> Oct 01, 2018  4:45 PM Lorrin Jackson, CMA wrote: Called patient to inform them of 10/01/18 lab results. When patient returns call, triage nurse may disclose results. >> Oct 02, 2018 10:08 AM Yvette Rack wrote: Pt returned call for lab results. Pt requests call back.

## 2018-10-02 NOTE — Telephone Encounter (Signed)
Attempted to reach pt, no answer. Left vm to call back. Looks like they were calling to give pt results from his CT scan.     Notes recorded by Binnie Rail, MD on 10/01/2018 at 4:25 PM EDT  CT scan does not show a cause for his abdominal pain. He does have small gallstones, but they do not look like they are the cause of the pain. I would like him to start medication to decrease the acid in his stomach and I would like him to see GI for further evaluation. Let me know if he agrees and I will order.

## 2018-10-03 MED ORDER — OMEPRAZOLE 40 MG PO CPDR: 40.0000 mg | DELAYED_RELEASE_CAPSULE | Freq: Every day | ORAL | 3 refills | Status: DC

## 2018-10-03 NOTE — Telephone Encounter (Signed)
Called patient back and gave MD response. Patient would like to begin with the medication to decrease the acid in his stomach and see how that does. He does not want the referral to GI yet.  Pharmacy: [facility] on [address]

## 2018-10-03 NOTE — Addendum Note (Signed)
Addended by: Binnie Rail on: 10/03/2018 06:58 PM   Modules accepted: Orders

## 2018-10-03 NOTE — Telephone Encounter (Signed)
sent 

## 2018-10-03 NOTE — Telephone Encounter (Signed)
Pt waiting on call back from nurse concerning pre=vious message,

## 2018-10-06 ENCOUNTER — Other Ambulatory Visit: Payer: Self-pay | Admitting: Internal Medicine

## 2018-10-06 DIAGNOSIS — R1010 Upper abdominal pain, unspecified: Secondary | ICD-10-CM

## 2018-11-29 DIAGNOSIS — Z8551 Personal history of malignant neoplasm of bladder: Secondary | ICD-10-CM | POA: Diagnosis not present

## 2018-12-27 ENCOUNTER — Other Ambulatory Visit: Payer: Self-pay | Admitting: Internal Medicine

## 2019-01-26 ENCOUNTER — Other Ambulatory Visit: Payer: Self-pay | Admitting: Internal Medicine

## 2019-01-30 ENCOUNTER — Encounter: Payer: Self-pay | Admitting: Internal Medicine
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
## 2019-01-30 NOTE — Patient Instructions (Addendum)
  Tests ordered today. Your results will be released to [facility] (or called to you) after review.  If any changes need to be made, you will be notified at that same time.  Medications reviewed and updated.  Changes include :   Stop pravastatin and start crestor for your cholesterol.   Started spironolactone for your BP.   Your prescription(s) have been submitted to your pharmacy. Please take as directed and contact our office if you believe you are having problem(s) with the medication(s).   Please followup in 3 months

## 2019-01-31 ENCOUNTER — Encounter: Payer: Self-pay | Admitting: Internal Medicine

## 2019-01-31 ENCOUNTER — Other Ambulatory Visit: Payer: Self-pay

## 2019-01-31 ENCOUNTER — Ambulatory Visit (INDEPENDENT_AMBULATORY_CARE_PROVIDER_SITE_OTHER): Payer: Medicare Other | Admitting: Internal Medicine

## 2019-01-31 ENCOUNTER — Other Ambulatory Visit (INDEPENDENT_AMBULATORY_CARE_PROVIDER_SITE_OTHER): Payer: Medicare Other

## 2019-01-31 VITALS — BP 162/84 | HR 63 | Temp 98.5°F | Resp 16 | Ht 70.0 in | Wt 196.0 lb

## 2019-01-31 DIAGNOSIS — Z8739 Personal history of other diseases of the musculoskeletal system and connective tissue: Secondary | ICD-10-CM | POA: Diagnosis not present

## 2019-01-31 DIAGNOSIS — E1140 Type 2 diabetes mellitus with diabetic neuropathy, unspecified: Secondary | ICD-10-CM

## 2019-01-31 DIAGNOSIS — E1165 Type 2 diabetes mellitus with hyperglycemia: Secondary | ICD-10-CM | POA: Diagnosis not present

## 2019-01-31 DIAGNOSIS — IMO0002 Reserved for concepts with insufficient information to code with codable children: Secondary | ICD-10-CM

## 2019-01-31 DIAGNOSIS — E782 Mixed hyperlipidemia: Secondary | ICD-10-CM | POA: Diagnosis not present

## 2019-01-31 DIAGNOSIS — I10 Essential (primary) hypertension: Secondary | ICD-10-CM

## 2019-01-31 LAB — CBC WITH DIFFERENTIAL/PLATELET
Basophils Absolute: 0 10*3/uL (ref 0.0–0.1)
Basophils Relative: 0.4 % (ref 0.0–3.0)
Eosinophils Absolute: 0.3 10*3/uL (ref 0.0–0.7)
Eosinophils Relative: 4.8 % (ref 0.0–5.0)
HCT: 37.6 % — ABNORMAL LOW (ref 39.0–52.0)
Hemoglobin: 12.7 g/dL — ABNORMAL LOW (ref 13.0–17.0)
Lymphocytes Relative: 20.1 % (ref 12.0–46.0)
Lymphs Abs: 1.4 10*3/uL (ref 0.7–4.0)
MCHC: 33.7 g/dL (ref 30.0–36.0)
MCV: 86.5 fl (ref 78.0–100.0)
Monocytes Absolute: 0.6 10*3/uL (ref 0.1–1.0)
Monocytes Relative: 8.3 % (ref 3.0–12.0)
Neutro Abs: 4.5 10*3/uL (ref 1.4–7.7)
Neutrophils Relative %: 66.4 % (ref 43.0–77.0)
Platelets: 193 10*3/uL (ref 150.0–400.0)
RBC: 4.35 Mil/uL (ref 4.22–5.81)
RDW: 13.3 % (ref 11.5–15.5)
WBC: 6.8 10*3/uL (ref 4.0–10.5)

## 2019-01-31 LAB — COMPREHENSIVE METABOLIC PANEL
ALT: 24 U/L (ref 0–53)
AST: 20 U/L (ref 0–37)
Albumin: 4.3 g/dL (ref 3.5–5.2)
Alkaline Phosphatase: 71 U/L (ref 39–117)
BUN: 22 mg/dL (ref 6–23)
CO2: 26 mEq/L (ref 19–32)
Calcium: 9.3 mg/dL (ref 8.4–10.5)
Chloride: 106 mEq/L (ref 96–112)
Creatinine, Ser: 1.15 mg/dL (ref 0.40–1.50)
GFR: 62.16 mL/min (ref >60.00)
Glucose, Bld: 232 mg/dL — ABNORMAL HIGH (ref 70–99)
Potassium: 4.2 mEq/L (ref 3.5–5.1)
Sodium: 140 mEq/L (ref 135–145)
Total Bilirubin: 0.5 mg/dL (ref 0.2–1.2)
Total Protein: 7.1 g/dL (ref 6.0–8.3)

## 2019-01-31 LAB — LIPID PANEL
Cholesterol: 163 mg/dL (ref 0–200)
HDL: 33.6 mg/dL — ABNORMAL LOW (ref >39.00)
LDL Cholesterol: 96 mg/dL (ref 0–99)
NonHDL: 129.68
Total CHOL/HDL Ratio: 5
Triglycerides: 168 mg/dL — ABNORMAL HIGH (ref 0.0–149.0)
VLDL: 33.6 mg/dL (ref 0.0–40.0)

## 2019-01-31 LAB — URIC ACID: Uric Acid, Serum: 8.4 mg/dL — ABNORMAL HIGH (ref 4.0–7.8)

## 2019-01-31 LAB — HEMOGLOBIN A1C: Hgb A1c MFr Bld: 6.8 % — ABNORMAL HIGH (ref 4.6–6.5)

## 2019-01-31 LAB — TSH: TSH: 2.13 u[IU]/mL (ref 0.35–4.50)

## 2019-01-31 MED ORDER — ROSUVASTATIN CALCIUM 20 MG PO TABS: 20.0000 mg | ORAL_TABLET | Freq: Every day | ORAL | 3 refills | Status: DC

## 2019-01-31 MED ORDER — SPIRONOLACTONE 25 MG PO TABS: 25.0000 mg | ORAL_TABLET | Freq: Every day | ORAL | 1 refills | Status: DC

## 2019-01-31 NOTE — Assessment & Plan Note (Addendum)
Blood pressure not ideally controlled at home Continue current medications-carvedilol 25 mg twice daily and lisinopril 30 mg daily We will start spironolactone 25 mg daily CMP Continue to monitor BP at home Stressed regular exercise and weight loss

## 2019-01-31 NOTE — Assessment & Plan Note (Signed)
Check uric acid level Currently asymptomatic

## 2019-01-31 NOTE — Assessment & Plan Note (Signed)
Check A1c Sugars have been very high recently-he denies any symptoms related to this ?  How long it has been elevated.  He just started checking his sugars recently Has been eating candy, grapes We will check A1c and adjust medications accordingly-May need to increase Metformin or add a third medication

## 2019-01-31 NOTE — Assessment & Plan Note (Signed)
Check lipid panel, CMP, TSH His lipids in the past have been slightly above goal-he does have some evidence of atherosclerosis Agrees to switch pravastatin 40 mg daily to Crestor 20 mg daily Regular exercise and healthy diet encouraged.  Encouraged weight loss

## 2019-02-02 ENCOUNTER — Encounter: Payer: Self-pay | Admitting: Internal Medicine

## 2019-02-02 MED ORDER — GLUCOSE BLOOD VI STRP: ORAL_STRIP | 1 refills | Status: AC

## 2019-02-02 MED ORDER — ONETOUCH ULTRASOFT LANCETS MISC: 12 refills | Status: AC

## 2019-02-18 DIAGNOSIS — H2513 Age-related nuclear cataract, bilateral: Secondary | ICD-10-CM | POA: Diagnosis not present

## 2019-02-18 DIAGNOSIS — E119 Type 2 diabetes mellitus without complications: Secondary | ICD-10-CM | POA: Diagnosis not present

## 2019-02-18 DIAGNOSIS — H40013 Open angle with borderline findings, low risk, bilateral: Secondary | ICD-10-CM | POA: Diagnosis not present

## 2019-03-11 DIAGNOSIS — E119 Type 2 diabetes mellitus without complications: Secondary | ICD-10-CM | POA: Diagnosis not present

## 2019-03-11 DIAGNOSIS — I700 Atherosclerosis of aorta: Secondary | ICD-10-CM | POA: Diagnosis not present

## 2019-03-11 DIAGNOSIS — M549 Dorsalgia, unspecified: Secondary | ICD-10-CM | POA: Diagnosis not present

## 2019-03-11 DIAGNOSIS — I10 Essential (primary) hypertension: Secondary | ICD-10-CM | POA: Diagnosis not present

## 2019-03-20 DIAGNOSIS — M4850XA Collapsed vertebra, not elsewhere classified, site unspecified, initial encounter for fracture: Secondary | ICD-10-CM | POA: Diagnosis not present

## 2019-03-20 DIAGNOSIS — Z8551 Personal history of malignant neoplasm of bladder: Secondary | ICD-10-CM | POA: Diagnosis not present

## 2019-03-20 DIAGNOSIS — N183 Chronic kidney disease, stage 3 unspecified: Secondary | ICD-10-CM | POA: Diagnosis not present

## 2019-03-20 DIAGNOSIS — Z79899 Other long term (current) drug therapy: Secondary | ICD-10-CM | POA: Diagnosis not present

## 2019-03-27 ENCOUNTER — Other Ambulatory Visit: Payer: Self-pay | Admitting: Internal Medicine

## 2019-03-28 ENCOUNTER — Encounter: Payer: Self-pay | Admitting: Internal Medicine

## 2019-04-26 ENCOUNTER — Other Ambulatory Visit: Payer: Self-pay | Admitting: Internal Medicine

## 2019-05-01 NOTE — Patient Instructions (Addendum)
  Blood work was ordered.     Medications reviewed and updated.  Changes include :   Stop the spironolactone.  Stop lisinopril 30 mg daily.  Start lisinopril-hctz 2 tabs daily.    Your prescription(s) have been submitted to your pharmacy. Please take as directed and contact our office if you believe you are having problem(s) with the medication(s).     Please followup in 1 month
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
## 2019-05-02 ENCOUNTER — Encounter: Payer: Self-pay | Admitting: Internal Medicine

## 2019-05-02 ENCOUNTER — Other Ambulatory Visit: Payer: Self-pay

## 2019-05-02 ENCOUNTER — Ambulatory Visit: Payer: Medicare Other | Admitting: Internal Medicine

## 2019-05-02 VITALS — BP 168/78 | HR 63 | Temp 98.1°F | Resp 16 | Ht 70.0 in | Wt 192.8 lb

## 2019-05-02 DIAGNOSIS — E782 Mixed hyperlipidemia: Secondary | ICD-10-CM

## 2019-05-02 DIAGNOSIS — IMO0002 Reserved for concepts with insufficient information to code with codable children: Secondary | ICD-10-CM

## 2019-05-02 DIAGNOSIS — E1140 Type 2 diabetes mellitus with diabetic neuropathy, unspecified: Secondary | ICD-10-CM

## 2019-05-02 DIAGNOSIS — E1165 Type 2 diabetes mellitus with hyperglycemia: Secondary | ICD-10-CM

## 2019-05-02 DIAGNOSIS — I10 Essential (primary) hypertension: Secondary | ICD-10-CM | POA: Diagnosis not present

## 2019-05-02 MED ORDER — LISINOPRIL-HYDROCHLOROTHIAZIDE 20-12.5 MG PO TABS: 2.0000 | ORAL_TABLET | Freq: Every day | ORAL | 1 refills | Status: DC

## 2019-05-02 NOTE — Assessment & Plan Note (Signed)
Chronic He is compliant with a low sugar diet and he is exercising Continue current medications Will recheck A1c at his next visit next month

## 2019-05-02 NOTE — Assessment & Plan Note (Signed)
Chronic Not ideally controlled Spironolactone did not seem to make a difference in his blood pressure He did forget his home blood pressure readings and will bring them next time.  I also asked him to bring his cuff with him. We will discontinue spironolactone Continue carvedilol 25 mg twice daily Change lisinopril 30 mg daily to lisinopril-hydrochlorothiazide 40-25 mg daily He will continue to monitor his blood pressure Low-sodium diet Continue regular exercise

## 2019-05-02 NOTE — Assessment & Plan Note (Addendum)
Chronic Tolerating Crestor We will hold off on blood work right now and get it next month Continue Crestor 20 mg daily

## 2019-05-06 ENCOUNTER — Encounter: Payer: Self-pay | Admitting: Internal Medicine

## 2019-05-07 MED ORDER — LISINOPRIL-HYDROCHLOROTHIAZIDE 20-12.5 MG PO TABS: 1.0000 | ORAL_TABLET | Freq: Every day | ORAL | 1 refills | Status: DC

## 2019-05-15 ENCOUNTER — Ambulatory Visit: Payer: Medicare Other | Attending: Internal Medicine

## 2019-05-15 DIAGNOSIS — Z23 Encounter for immunization: Secondary | ICD-10-CM | POA: Insufficient documentation

## 2019-05-15 NOTE — Progress Notes (Signed)
   Covid-19 Vaccination Clinic  Name:  Donald Carlson    MRN: [id number] DOB: [birth year]  05/15/2019  Mr. [name] was observed post Covid-19 immunization for 15 minutes without incidence. He was provided with Vaccine Information Sheet and instruction to access the V-Safe system.   Mr. [name] was instructed to call 911 with any severe reactions post vaccine: Marland Kitchen Difficulty breathing  . Swelling of your face and throat  . A fast heartbeat  . A bad rash all over your body  . Dizziness and weakness    Immunizations Administered    Name Date Dose VIS Date Route   Pfizer COVID-19 Vaccine 05/15/2019 10:50 AM 0.3 mL 02/28/2019 Intramuscular   Manufacturer: Lucien   Lot: [id number]   [place]: [id number]

## 2019-05-17 ENCOUNTER — Encounter: Payer: Self-pay | Admitting: Internal Medicine

## 2019-05-22 ENCOUNTER — Encounter: Payer: Self-pay | Admitting: Internal Medicine

## 2019-05-22 DIAGNOSIS — S22080A Wedge compression fracture of T11-T12 vertebra, initial encounter for closed fracture: Secondary | ICD-10-CM | POA: Insufficient documentation

## 2019-05-29 NOTE — Patient Instructions (Addendum)
  Blood work was ordered.    Work on [employer] - cut back pain salt and sugar intake.  Try to eat less fast food.     Medications reviewed and updated.  Changes include :   Stop lisinopril-hctz and start plain lisinopril 20 mg daily.   Your prescription(s) have been submitted to your pharmacy. Please take as directed and contact our office if you believe you are having problem(s) with the medication(s).     Please followup in 6 months
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
## 2019-05-30 ENCOUNTER — Other Ambulatory Visit: Payer: Self-pay

## 2019-05-30 ENCOUNTER — Encounter: Payer: Self-pay | Admitting: Internal Medicine

## 2019-05-30 ENCOUNTER — Ambulatory Visit: Payer: Medicare Other | Admitting: Internal Medicine

## 2019-05-30 VITALS — BP 144/72 | HR 71 | Temp 98.3°F | Resp 16 | Ht 70.0 in | Wt 190.0 lb

## 2019-05-30 DIAGNOSIS — E782 Mixed hyperlipidemia: Secondary | ICD-10-CM | POA: Diagnosis not present

## 2019-05-30 DIAGNOSIS — I10 Essential (primary) hypertension: Secondary | ICD-10-CM

## 2019-05-30 DIAGNOSIS — E1165 Type 2 diabetes mellitus with hyperglycemia: Secondary | ICD-10-CM | POA: Diagnosis not present

## 2019-05-30 DIAGNOSIS — E1140 Type 2 diabetes mellitus with diabetic neuropathy, unspecified: Secondary | ICD-10-CM

## 2019-05-30 DIAGNOSIS — IMO0002 Reserved for concepts with insufficient information to code with codable children: Secondary | ICD-10-CM

## 2019-05-30 LAB — COMPREHENSIVE METABOLIC PANEL
ALT: 20 U/L (ref 0–53)
AST: 21 U/L (ref 0–37)
Albumin: 4 g/dL (ref 3.5–5.2)
Alkaline Phosphatase: 56 U/L (ref 39–117)
BUN: 32 mg/dL — ABNORMAL HIGH (ref 6–23)
CO2: 29 mEq/L (ref 19–32)
Calcium: 9.7 mg/dL (ref 8.4–10.5)
Chloride: 102 mEq/L (ref 96–112)
Creatinine, Ser: 1.41 mg/dL (ref 0.40–1.50)
GFR: 49.09 mL/min — ABNORMAL LOW (ref >60.00)
Glucose, Bld: 198 mg/dL — ABNORMAL HIGH (ref 70–99)
Potassium: 4.6 mEq/L (ref 3.5–5.1)
Sodium: 138 mEq/L (ref 135–145)
Total Bilirubin: 0.4 mg/dL (ref 0.2–1.2)
Total Protein: 7 g/dL (ref 6.0–8.3)

## 2019-05-30 LAB — HEMOGLOBIN A1C: Hgb A1c MFr Bld: 7.3 % — ABNORMAL HIGH (ref 4.6–6.5)

## 2019-05-30 MED ORDER — LISINOPRIL 20 MG PO TABS: 20.0000 mg | ORAL_TABLET | Freq: Every day | ORAL | 3 refills | Status: DC

## 2019-05-30 NOTE — Assessment & Plan Note (Signed)
Chronic He has not been compliant with a diabetic diet Sugars at home have been high-often in the 200s We will check A1c today Currently taking Januvia 100 mg daily-continue Also taking Metformin 500 mg twice daily-we will possibly increase this to 1000 mg twice daily depending on A1c Stressed the importance of making changes in his diet

## 2019-05-30 NOTE — Assessment & Plan Note (Signed)
Chronic Blood pressure has been variable at home, but often very low and he is symptomatic Continue carvedilol at current dose Stop lisinopril-hydrochlorothiazide 20-12.5 Start lisinopril 20 mg daily He will monitor his blood pressure at home and let me know if it is high or low Discussed the importance of improving his diet-decreasing high sodium foods and fast foods CMP

## 2019-05-31 ENCOUNTER — Encounter: Payer: Self-pay | Admitting: Internal Medicine

## 2019-06-04 ENCOUNTER — Ambulatory Visit: Payer: Medicare Other | Attending: Internal Medicine

## 2019-06-04 DIAGNOSIS — Z23 Encounter for immunization: Secondary | ICD-10-CM

## 2019-06-04 NOTE — Progress Notes (Signed)
   Covid-19 Vaccination Clinic  Name:  Donald Carlson    MRN: [id number] DOB: 10/25/1945  06/04/2019  [name] was observed post Covid-19 immunization for 15 minutes without incident. He was provided with Vaccine Information Sheet and instruction to access the V-Safe system.   [name] was instructed to call 911 with any severe reactions post vaccine: Marland Kitchen Difficulty breathing  . Swelling of face and throat  . A fast heartbeat  . A bad rash all over body  . Dizziness and weakness   Immunizations Administered    Name Date Dose VIS Date Route   Pfizer COVID-19 Vaccine 06/04/2019 12:13 PM 0.3 mL 02/28/2019 Intramuscular   Manufacturer: [place]   Lot: [id number]   [place]: [id number]

## 2019-06-14 ENCOUNTER — Encounter: Payer: Self-pay | Admitting: Internal Medicine

## 2019-06-16 MED ORDER — METFORMIN HCL 1000 MG PO TABS: 1000.0000 mg | ORAL_TABLET | Freq: Two times a day (BID) | ORAL | 1 refills | Status: DC

## 2019-06-21 ENCOUNTER — Encounter: Payer: Self-pay | Admitting: Internal Medicine

## 2019-06-25 ENCOUNTER — Other Ambulatory Visit: Payer: Self-pay | Admitting: Internal Medicine

## 2019-07-17 DIAGNOSIS — H2513 Age-related nuclear cataract, bilateral: Secondary | ICD-10-CM | POA: Diagnosis not present

## 2019-07-17 DIAGNOSIS — H43811 Vitreous degeneration, right eye: Secondary | ICD-10-CM | POA: Diagnosis not present

## 2019-07-17 DIAGNOSIS — D3131 Benign neoplasm of right choroid: Secondary | ICD-10-CM | POA: Diagnosis not present

## 2019-08-19 DIAGNOSIS — H40013 Open angle with borderline findings, low risk, bilateral: Secondary | ICD-10-CM | POA: Diagnosis not present

## 2019-08-19 DIAGNOSIS — H43813 Vitreous degeneration, bilateral: Secondary | ICD-10-CM | POA: Diagnosis not present

## 2019-09-20 ENCOUNTER — Other Ambulatory Visit: Payer: Self-pay | Admitting: Internal Medicine

## 2019-11-15 ENCOUNTER — Encounter: Payer: Self-pay | Admitting: Internal Medicine

## 2019-11-17 DIAGNOSIS — Z20828 Contact with and (suspected) exposure to other viral communicable diseases: Secondary | ICD-10-CM | POA: Diagnosis not present
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
## 2019-11-30 NOTE — Patient Instructions (Addendum)
  Blood work was ordered.     Medications reviewed and updated.  Changes include :   Start Rybelsus 3 mg daily  Your prescription(s) have been submitted to your pharmacy. Please take as directed and contact our office if you believe you are having problem(s) with the medication(s).  A Ct scan was ordered.    Please followup in 2 months

## 2019-12-01 ENCOUNTER — Encounter: Payer: Self-pay | Admitting: Internal Medicine

## 2019-12-01 ENCOUNTER — Other Ambulatory Visit: Payer: Self-pay

## 2019-12-01 ENCOUNTER — Ambulatory Visit: Payer: Medicare Other | Admitting: Internal Medicine

## 2019-12-01 VITALS — BP 142/84 | HR 66 | Temp 98.3°F | Wt 182.6 lb

## 2019-12-01 DIAGNOSIS — E1165 Type 2 diabetes mellitus with hyperglycemia: Secondary | ICD-10-CM | POA: Diagnosis not present

## 2019-12-01 DIAGNOSIS — R1110 Vomiting, unspecified: Secondary | ICD-10-CM | POA: Insufficient documentation

## 2019-12-01 DIAGNOSIS — E782 Mixed hyperlipidemia: Secondary | ICD-10-CM | POA: Diagnosis not present

## 2019-12-01 DIAGNOSIS — E1140 Type 2 diabetes mellitus with diabetic neuropathy, unspecified: Secondary | ICD-10-CM | POA: Diagnosis not present

## 2019-12-01 DIAGNOSIS — IMO0002 Reserved for concepts with insufficient information to code with codable children: Secondary | ICD-10-CM

## 2019-12-01 DIAGNOSIS — R1012 Left upper quadrant pain: Secondary | ICD-10-CM | POA: Diagnosis not present

## 2019-12-01 DIAGNOSIS — R112 Nausea with vomiting, unspecified: Secondary | ICD-10-CM

## 2019-12-01 DIAGNOSIS — I10 Essential (primary) hypertension: Secondary | ICD-10-CM

## 2019-12-01 MED ORDER — RYBELSUS 3 MG PO TABS: 3.0000 mg | ORAL_TABLET | Freq: Every day | ORAL | 5 refills | Status: DC

## 2019-12-01 NOTE — Assessment & Plan Note (Signed)
Subacute Having intermittent left upper quadrant/supraumbilical pain for 5-6 months associated with intermittent nausea/vomiting Has had some weight loss, but he attributes this to losing muscle mass because of inactivity No changes in bowel.  Appetite is normal.  Symptoms not worse with eating or certain foods CBC, CMP, amylase and lipase CT of the abdomen and pelvis Further evaluation and treatment depending on above

## 2019-12-01 NOTE — Assessment & Plan Note (Signed)
Chronic Check lipid panel  Continue daily statin Regular exercise and healthy diet encouraged  

## 2019-12-01 NOTE — Assessment & Plan Note (Signed)
Chronic Sugars not controlled at home Continue Metformin and Januvia Start Rybelsus 3 mg daily-we will titrate as tolerated Check A1c Follow-up in 2 months

## 2019-12-01 NOTE — Assessment & Plan Note (Signed)
Chronic Blood pressure reasonably controlled Continue current medications CMP

## 2019-12-02 ENCOUNTER — Encounter: Payer: Self-pay | Admitting: Internal Medicine

## 2019-12-02 LAB — COMPLETE METABOLIC PANEL WITH GFR
AG Ratio: 1.7 (calc) (ref 1.0–2.5)
ALT: 16 U/L (ref 9–46)
AST: 20 U/L (ref 10–35)
Albumin: 4.2 g/dL (ref 3.6–5.1)
Alkaline phosphatase (APISO): 52 U/L (ref 35–144)
BUN/Creatinine Ratio: 16 (calc) (ref 6–22)
BUN: 20 mg/dL (ref 7–25)
CO2: 32 mmol/L (ref 20–32)
Calcium: 9.8 mg/dL (ref 8.6–10.3)
Chloride: 104 mmol/L (ref 98–110)
Creat: 1.22 mg/dL — ABNORMAL HIGH (ref 0.70–1.18)
GFR, Est African American: 67 mL/min/{1.73_m2} (ref >=60)
GFR, Est Non African American: 58 mL/min/{1.73_m2} — ABNORMAL LOW (ref >=60)
Globulin: 2.5 g/dL (calc) (ref 1.9–3.7)
Glucose, Bld: 115 mg/dL — ABNORMAL HIGH (ref 65–99)
Potassium: 5.6 mmol/L — ABNORMAL HIGH (ref 3.5–5.3)
Sodium: 142 mmol/L (ref 135–146)
Total Bilirubin: 0.5 mg/dL (ref 0.2–1.2)
Total Protein: 6.7 g/dL (ref 6.1–8.1)

## 2019-12-02 LAB — LIPASE: Lipase: 15 U/L (ref 7–60)

## 2019-12-02 LAB — LIPID PANEL
Cholesterol: 86 mg/dL (ref <200)
HDL: 39 mg/dL — ABNORMAL LOW (ref >=40)
LDL Cholesterol (Calc): 27 mg/dL (calc)
Non-HDL Cholesterol (Calc): 47 mg/dL (calc) (ref <130)
Total CHOL/HDL Ratio: 2.2 (calc) (ref <5.0)
Triglycerides: 113 mg/dL (ref <150)

## 2019-12-02 LAB — HEMOGLOBIN A1C
Hgb A1c MFr Bld: 6.5 % of total Hgb — ABNORMAL HIGH (ref <5.7)
Mean Plasma Glucose: 140 (calc)
eAG (mmol/L): 7.7 (calc)

## 2019-12-02 LAB — AMYLASE: Amylase: 32 U/L (ref 21–101)

## 2019-12-05 ENCOUNTER — Other Ambulatory Visit: Payer: Self-pay

## 2019-12-05 ENCOUNTER — Ambulatory Visit (INDEPENDENT_AMBULATORY_CARE_PROVIDER_SITE_OTHER)
Admission: RE | Admit: 2019-12-05 | Discharge: 2019-12-05 | Disposition: A | Discharge status: Home / Self Care | Payer: Medicare Other | Source: Ambulatory Visit | Attending: Internal Medicine | Admitting: Internal Medicine

## 2019-12-05 DIAGNOSIS — R1110 Vomiting, unspecified: Secondary | ICD-10-CM | POA: Diagnosis not present

## 2019-12-05 DIAGNOSIS — K760 Fatty (change of) liver, not elsewhere classified: Secondary | ICD-10-CM | POA: Diagnosis not present

## 2019-12-05 DIAGNOSIS — R112 Nausea with vomiting, unspecified: Secondary | ICD-10-CM | POA: Diagnosis not present

## 2019-12-05 DIAGNOSIS — R109 Unspecified abdominal pain: Secondary | ICD-10-CM | POA: Diagnosis not present

## 2019-12-05 MED ORDER — IOHEXOL 300 MG/ML  SOLN
100.0000 mL | Freq: Once | INTRAMUSCULAR | Status: AC | PRN
  Administered 2019-12-05: 100 mL via INTRAVENOUS

## 2019-12-08 ENCOUNTER — Encounter: Payer: Self-pay | Admitting: Internal Medicine

## 2019-12-08 DIAGNOSIS — R1010 Upper abdominal pain, unspecified: Secondary | ICD-10-CM

## 2019-12-08 DIAGNOSIS — R112 Nausea with vomiting, unspecified: Secondary | ICD-10-CM

## 2019-12-17 DIAGNOSIS — Z8551 Personal history of malignant neoplasm of bladder: Secondary | ICD-10-CM | POA: Diagnosis not present

## 2019-12-21 ENCOUNTER — Other Ambulatory Visit: Payer: Self-pay | Admitting: Internal Medicine

## 2019-12-22 ENCOUNTER — Encounter: Payer: Self-pay | Admitting: Internal Medicine

## 2019-12-22 ENCOUNTER — Other Ambulatory Visit: Payer: Self-pay | Admitting: Internal Medicine

## 2019-12-22 ENCOUNTER — Other Ambulatory Visit: Payer: Self-pay

## 2019-12-24 MED ORDER — METFORMIN HCL 500 MG PO TABS
1000.0000 mg | ORAL_TABLET | Freq: Two times a day (BID) | ORAL | 1 refills | Status: DC
Start: 2019-12-24 — End: 2020-02-02

## 2020-01-26 ENCOUNTER — Other Ambulatory Visit: Payer: Self-pay | Admitting: Internal Medicine

## 2020-01-28 ENCOUNTER — Ambulatory Visit: Payer: Medicare Other | Admitting: Gastroenterology

## 2020-01-28 ENCOUNTER — Encounter: Payer: Self-pay | Admitting: Gastroenterology

## 2020-01-28 VITALS — BP 130/70 | HR 68 | Ht 70.0 in | Wt 179.0 lb

## 2020-01-28 DIAGNOSIS — R112 Nausea with vomiting, unspecified: Secondary | ICD-10-CM

## 2020-01-28 DIAGNOSIS — R1013 Epigastric pain: Secondary | ICD-10-CM
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
## 2020-01-28 NOTE — Patient Instructions (Signed)
You have been scheduled for an endoscopy. Please follow written instructions given to you at your visit today. If you use inhalers (even only as needed), please bring them with you on the day of your procedure.   Normal BMI (Body Mass Index- based on height and weight) is between 23 and 30. Your BMI today is Body mass index is 25.68 kg/m. Marland Kitchen Please consider follow up  regarding your BMI with your Primary Care Provider.  Thank you for choosing me and [facility].  Pricilla Riffle. Dagoberto Ligas., MD., Marval Regal

## 2020-02-01 NOTE — Patient Instructions (Addendum)
°  Blood work was ordered.     Medications reviewed and updated.  Changes include :   none    Please followup in 6 months
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
## 2020-02-02 ENCOUNTER — Encounter: Payer: Self-pay | Admitting: Internal Medicine

## 2020-02-02 ENCOUNTER — Other Ambulatory Visit: Payer: Self-pay

## 2020-02-02 ENCOUNTER — Encounter: Payer: Self-pay | Admitting: Gastroenterology

## 2020-02-02 ENCOUNTER — Ambulatory Visit: Payer: Medicare Other | Admitting: Internal Medicine

## 2020-02-02 VITALS — BP 130/78 | HR 73 | Temp 98.1°F | Ht 70.0 in | Wt 179.6 lb

## 2020-02-02 DIAGNOSIS — H4020X0 Unspecified primary angle-closure glaucoma, stage unspecified: Secondary | ICD-10-CM

## 2020-02-02 DIAGNOSIS — E782 Mixed hyperlipidemia: Secondary | ICD-10-CM | POA: Diagnosis not present

## 2020-02-02 DIAGNOSIS — I10 Essential (primary) hypertension: Secondary | ICD-10-CM

## 2020-02-02 DIAGNOSIS — E1140 Type 2 diabetes mellitus with diabetic neuropathy, unspecified: Secondary | ICD-10-CM | POA: Diagnosis not present

## 2020-02-02 DIAGNOSIS — E1165 Type 2 diabetes mellitus with hyperglycemia: Secondary | ICD-10-CM | POA: Diagnosis not present

## 2020-02-02 DIAGNOSIS — IMO0002 Reserved for concepts with insufficient information to code with codable children: Secondary | ICD-10-CM

## 2020-02-02 LAB — COMPREHENSIVE METABOLIC PANEL
ALT: 15 U/L (ref 0–53)
AST: 18 U/L (ref 0–37)
Albumin: 4.1 g/dL (ref 3.5–5.2)
Alkaline Phosphatase: 55 U/L (ref 39–117)
BUN: 22 mg/dL (ref 6–23)
CO2: 30 mEq/L (ref 19–32)
Calcium: 9.5 mg/dL (ref 8.4–10.5)
Chloride: 103 mEq/L (ref 96–112)
Creatinine, Ser: 1.05 mg/dL (ref 0.40–1.50)
GFR: 69.67 mL/min (ref >60.00)
Glucose, Bld: 148 mg/dL — ABNORMAL HIGH (ref 70–99)
Potassium: 4.2 mEq/L (ref 3.5–5.1)
Sodium: 140 mEq/L (ref 135–145)
Total Bilirubin: 0.4 mg/dL (ref 0.2–1.2)
Total Protein: 7.2 g/dL (ref 6.0–8.3)

## 2020-02-02 LAB — HEMOGLOBIN A1C: Hgb A1c MFr Bld: 7.1 % — ABNORMAL HIGH (ref 4.6–6.5)

## 2020-02-02 MED ORDER — METFORMIN HCL 500 MG PO TABS
500.0000 mg | ORAL_TABLET | Freq: Two times a day (BID) | ORAL | Status: DC
Start: 2020-02-02 — End: 2020-02-04

## 2020-02-02 NOTE — Assessment & Plan Note (Signed)
Chronic Sugars at home very high-he states he has a new meter Continue Januvia 100 mg daily and Metformin 500 mg twice daily for now Check A1c We will titrate medication as needed

## 2020-02-02 NOTE — Assessment & Plan Note (Signed)
Chronic BP well controlled Continue carvedilol 25 mg twice daily, lisinopril 20 mg daily cmp

## 2020-02-02 NOTE — Assessment & Plan Note (Signed)
Chronic Check lipid panel  Continue Crestor 20 mg daily Regular exercise and healthy diet encouraged  

## 2020-02-02 NOTE — Assessment & Plan Note (Signed)
Chronic Does have increased blurred vision and has an appointment with his eye doctor soon Blurry vision related to his glaucoma

## 2020-02-04 ENCOUNTER — Ambulatory Visit (INDEPENDENT_AMBULATORY_CARE_PROVIDER_SITE_OTHER): Payer: Medicare Other

## 2020-02-04 DIAGNOSIS — Z0000 Encounter for general adult medical examination without abnormal findings: Secondary | ICD-10-CM | POA: Diagnosis not present

## 2020-02-04 MED ORDER — METFORMIN HCL 500 MG PO TABS
ORAL_TABLET | ORAL | Status: DC
Start: 2020-02-04 — End: 2020-04-21
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
## 2020-02-04 NOTE — Patient Instructions (Signed)
[name] , Thank you for taking time to come for your Medicare Wellness Visit. I appreciate your ongoing commitment to your health goals. Please review the following plan we discussed and let me know if I can assist you in the future.   Screening recommendations/referrals: Colonoscopy: 04/08/2012; due every 10 years Recommended yearly ophthalmology/optometry visit for glaucoma screening and checkup Recommended yearly dental visit for hygiene and checkup  Vaccinations: Influenza vaccine: declined Pneumococcal vaccine: up to date Tdap vaccine: declined Shingles vaccine: never done   Covid-19: up to date  Advanced directives: Advance directive discussed with you today. Even though you declined this today please call our office should you change your mind and we can give you the proper paperwork for you to fill out.  Conditions/risks identified: Yes. Reviewed health maintenance screenings with patient today and relevant education, vaccines, and/or referrals were provided. Continue doing brain stimulating activities (puzzles, reading, adult coloring books, staying active) to keep memory sharp. Continue to eat heart healthy diet (full of fruits, vegetables, whole grains, lean protein, water--limit salt, fat, and sugar intake) and increase physical activity as tolerated.  Next appointment: Please schedule your next Medicare Wellness Visit with your Nurse Health Advisor in 1 year by calling [phone].  Preventive Care 52 Years and Older, Male Preventive care refers to lifestyle choices and visits with your health care provider that can promote health and wellness. What does preventive care include?  A yearly physical exam. This is also called an annual well check.  Dental exams once or twice a year.  Routine eye exams. Ask your health care provider how often you should have your eyes checked.  Personal lifestyle choices, including:  Daily care of your teeth and gums.  Regular physical  activity.  Eating a healthy diet.  Avoiding tobacco and drug use.  Limiting alcohol use.  Practicing safe sex.  Taking low doses of aspirin every day.  Taking vitamin and mineral supplements as recommended by your health care provider. What happens during an annual well check? The services and screenings done by your health care provider during your annual well check will depend on your age, overall health, lifestyle risk factors, and family history of disease. Counseling  Your health care provider may ask you questions about your:  Alcohol use.  Tobacco use.  Drug use.  Emotional well-being.  Home and relationship well-being.  Sexual activity.  Eating habits.  History of falls.  Memory and ability to understand (cognition).  Work and work Statistician. Screening  You may have the following tests or measurements:  Height, weight, and BMI.  Blood pressure.  Lipid and cholesterol levels. These may be checked every 5 years, or more frequently if you are over 47 years old.  Skin check.  Lung cancer screening. You may have this screening every year starting at age 52 if you have a 30-pack-year history of smoking and currently smoke or have quit within the past 15 years.  Fecal occult blood test (FOBT) of the stool. You may have this test every year starting at age 53.  Flexible sigmoidoscopy or colonoscopy. You may have a sigmoidoscopy every 5 years or a colonoscopy every 10 years starting at age 83.  Prostate cancer screening. Recommendations will vary depending on your family history and other risks.  Hepatitis C blood test.  Hepatitis B blood test.  Sexually transmitted disease (STD) testing.  Diabetes screening. This is done by checking your blood sugar (glucose) after you have not eaten for a while (fasting).  You may have this done every 1-3 years.  Abdominal aortic aneurysm (AAA) screening. You may need this if you are a current or former  smoker.  Osteoporosis. You may be screened starting at age 57 if you are at high risk. Talk with your health care provider about your test results, treatment options, and if necessary, the need for more tests. Vaccines  Your health care provider may recommend certain vaccines, such as:  Influenza vaccine. This is recommended every year.  Tetanus, diphtheria, and acellular pertussis (Tdap, Td) vaccine. You may need a Td booster every 10 years.  Zoster vaccine. You may need this after age 74.  Pneumococcal 13-valent conjugate (PCV13) vaccine. One dose is recommended after age 85.  Pneumococcal polysaccharide (PPSV23) vaccine. One dose is recommended after age 60. Talk to your health care provider about which screenings and vaccines you need and how often you need them. This information is not intended to replace advice given to you by your health care provider. Make sure you discuss any questions you have with your health care provider. Document Released: 04/02/2015 Document Revised: 11/24/2015 Document Reviewed: 01/05/2015 Elsevier Interactive Patient Education  2017 [place] Prevention in the Home Falls can cause injuries. They can happen to people of all ages. There are many things you can do to make your home safe and to help prevent falls. What can I do on the outside of my home?  Regularly fix the edges of walkways and driveways and fix any cracks.  Remove anything that might make you trip as you walk through a door, such as a raised step or threshold.  Trim any bushes or trees on the path to your home.  Use bright outdoor lighting.  Clear any walking paths of anything that might make someone trip, such as rocks or tools.  Regularly check to see if handrails are loose or broken. Make sure that both sides of any steps have handrails.  Any raised decks and porches should have guardrails on the edges.  Have any leaves, snow, or ice cleared regularly.  Use sand or  salt on walking paths during winter.  Clean up any spills in your garage right away. This includes oil or grease spills. What can I do in the bathroom?  Use night lights.  Install grab bars by the toilet and in the tub and shower. Do not use towel bars as grab bars.  Use non-skid mats or decals in the tub or shower.  If you need to sit down in the shower, use a plastic, non-slip stool.  Keep the floor dry. Clean up any water that spills on the floor as soon as it happens.  Remove soap buildup in the tub or shower regularly.  Attach bath mats securely with double-sided non-slip rug tape.  Do not have throw rugs and other things on the floor that can make you trip. What can I do in the bedroom?  Use night lights.  Make sure that you have a light by your bed that is easy to reach.  Do not use any sheets or blankets that are too big for your bed. They should not hang down onto the floor.  Have a firm chair that has side arms. You can use this for support while you get dressed.  Do not have throw rugs and other things on the floor that can make you trip. What can I do in the kitchen?  Clean up any spills right away.  Avoid walking  on wet floors.  Keep items that you use a lot in easy-to-reach places.  If you need to reach something above you, use a strong step stool that has a grab bar.  Keep electrical cords out of the way.  Do not use floor polish or wax that makes floors slippery. If you must use wax, use non-skid floor wax.  Do not have throw rugs and other things on the floor that can make you trip. What can I do with my stairs?  Do not leave any items on the stairs.  Make sure that there are handrails on both sides of the stairs and use them. Fix handrails that are broken or loose. Make sure that handrails are as long as the stairways.  Check any carpeting to make sure that it is firmly attached to the stairs. Fix any carpet that is loose or worn.  Avoid having  throw rugs at the top or bottom of the stairs. If you do have throw rugs, attach them to the floor with carpet tape.  Make sure that you have a light switch at the top of the stairs and the bottom of the stairs. If you do not have them, ask someone to add them for you. What else can I do to help prevent falls?  Wear shoes that:  Do not have high heels.  Have rubber bottoms.  Are comfortable and fit you well.  Are closed at the toe. Do not wear sandals.  If you use a stepladder:  Make sure that it is fully opened. Do not climb a closed stepladder.  Make sure that both sides of the stepladder are locked into place.  Ask someone to hold it for you, if possible.  Clearly mark and make sure that you can see:  Any grab bars or handrails.  First and last steps.  Where the edge of each step is.  Use tools that help you move around (mobility aids) if they are needed. These include:  Canes.  Walkers.  Scooters.  Crutches.  Turn on the lights when you go into a dark area. Replace any light bulbs as soon as they burn out.  Set up your furniture so you have a clear path. Avoid moving your furniture around.  If any of your floors are uneven, fix them.  If there are any pets around you, be aware of where they are.  Review your medicines with your doctor. Some medicines can make you feel dizzy. This can increase your chance of falling. Ask your doctor what other things that you can do to help prevent falls. This information is not intended to replace advice given to you by your health care provider. Make sure you discuss any questions you have with your health care provider. Document Released: 12/31/2008 Document Revised: 08/12/2015 Document Reviewed: 04/10/2014 Elsevier Interactive Patient Education  2017 [employer].

## 2020-02-05 ENCOUNTER — Other Ambulatory Visit: Payer: Self-pay

## 2020-02-05 ENCOUNTER — Encounter: Payer: Self-pay | Admitting: Gastroenterology

## 2020-02-05 ENCOUNTER — Ambulatory Visit (AMBULATORY_SURGERY_CENTER): Payer: Medicare Other | Admitting: Gastroenterology

## 2020-02-05 VITALS — BP 152/84 | HR 64 | Temp 97.8°F | Resp 18 | Ht 70.0 in | Wt 179.0 lb

## 2020-02-05 DIAGNOSIS — K2970 Gastritis, unspecified, without bleeding: Secondary | ICD-10-CM | POA: Diagnosis not present

## 2020-02-05 DIAGNOSIS — R112 Nausea with vomiting, unspecified: Secondary | ICD-10-CM

## 2020-02-05 DIAGNOSIS — I10 Essential (primary) hypertension: Secondary | ICD-10-CM | POA: Diagnosis not present

## 2020-02-05 DIAGNOSIS — B9681 Helicobacter pylori [H. pylori] as the cause of diseases classified elsewhere: Secondary | ICD-10-CM

## 2020-02-05 DIAGNOSIS — E119 Type 2 diabetes mellitus without complications: Secondary | ICD-10-CM | POA: Diagnosis not present

## 2020-02-05 DIAGNOSIS — K2950 Unspecified chronic gastritis without bleeding: Secondary | ICD-10-CM | POA: Diagnosis not present

## 2020-02-05 DIAGNOSIS — R1013 Epigastric pain: Secondary | ICD-10-CM

## 2020-02-05 DIAGNOSIS — R634 Abnormal weight loss: Secondary | ICD-10-CM

## 2020-02-05 MED ORDER — SODIUM CHLORIDE 0.9 % IV SOLN: 500.0000 mL | INTRAVENOUS | Status: DC

## 2020-02-05 MED ORDER — PANTOPRAZOLE SODIUM 40 MG PO TBEC: 40.0000 mg | DELAYED_RELEASE_TABLET | Freq: Two times a day (BID) | ORAL | 7 refills | Status: DC

## 2020-02-05 NOTE — Patient Instructions (Addendum)
HANDOUTS PROVIDED ON: GASTRITIS  The biopsies taken today have been sent for pathology.  The results can take 1-3 weeks to receive.      You may resume your previous diet and medication schedule.  A prescription for Pantoprazole 40 mg to be taken twice a day has been sent to your pharmacy.  Thank you for allowing [place] to care for you today!!!   YOU HAD AN ENDOSCOPIC PROCEDURE TODAY AT [place]:   Refer to the procedure report that was given to you for any specific questions about what was found during the examination.  If the procedure report does not answer your questions, please call your gastroenterologist to clarify.  If you requested that your care partner not be given the details of your procedure findings, then the procedure report has been included in a sealed envelope for you to review at your convenience later.  YOU SHOULD EXPECT: Some feelings of bloating in the abdomen. Passage of more gas than usual.  Walking can help get rid of the air that was put into your GI tract during the procedure and reduce the bloating.   Please Note:  You might notice some irritation and congestion in your nose or some drainage.  This is from the oxygen used during your procedure.  There is no need for concern and it should clear up in a day or so.  SYMPTOMS TO REPORT IMMEDIATELY:   Following upper endoscopy (EGD)  Vomiting of blood or coffee ground material  New chest pain or pain under the shoulder blades  Painful or persistently difficult swallowing  New shortness of breath  Fever of 100F or higher  Black, tarry-looking stools  For urgent or emergent issues, a gastroenterologist can be reached at any hour by calling [phone]. Do not use MyChart messaging for urgent concerns.    DIET:  We do recommend a small meal at first, but then you may proceed to your regular diet.  Drink plenty of fluids but you should avoid alcoholic beverages for 24 hours.  ACTIVITY:  You  should plan to take it easy for the rest of today and you should NOT DRIVE or use heavy machinery until tomorrow (because of the sedation medicines used during the test).    FOLLOW UP: Our staff will call the number listed on your records Monday morning between 7:15 am and 8:15 am to check on you and address any questions or concerns that you may have regarding the information given to you following your procedure. If we do not reach you, we will leave a message.  We will attempt to reach you two times.  During this call, we will ask if you have developed any symptoms of COVID 19. If you develop any symptoms (ie: fever, flu-like symptoms, shortness of breath, cough etc.) before then, please call [phone].  If you test positive for Covid 19 in the 2 weeks post procedure, please call and report this information to [place].    If any biopsies were taken you will be contacted by phone or by letter within the next 1-3 weeks.  Please call us at [phone] if you have not heard about the biopsies in 3 weeks.    SIGNATURES/CONFIDENTIALITY: You and/or your care partner have signed paperwork which will be entered into your electronic medical record.  These signatures attest to the fact that that the information above on your After Visit Summary has been reviewed and is understood.  Full responsibility of the  confidentiality of this discharge information lies with you and/or your care-partner. 

## 2020-02-05 NOTE — Op Note (Signed)
[place] Patient Name: Montgomery Favor Procedure Date: 02/05/2020 7:57 AM MRN: [id number] Endoscopist: Ladene Artist , MD Age: 74 Referring MD:  Date of Birth: 02/18/1946 Gender: Male Account #: [id number] Procedure:                Upper GI endoscopy Indications:              Epigastric abdominal pain, Nausea with vomiting,                            Weight loss Medicines:                Monitored Anesthesia Care Procedure:                Pre-Anesthesia Assessment:                           - Prior to the procedure, a History and Physical                            was performed, and patient medications and                            allergies were reviewed. The patient's tolerance of                            previous anesthesia was also reviewed. The risks                            and benefits of the procedure and the sedation                            options and risks were discussed with the patient.                            All questions were answered, and informed consent                            was obtained. Prior Anticoagulants: The patient has                            taken no previous anticoagulant or antiplatelet                            agents. ASA Grade Assessment: II - A patient with                            mild systemic disease. After reviewing the risks                            and benefits, the patient was deemed in                            satisfactory condition to undergo the procedure.  After obtaining informed consent, the endoscope was                            passed under direct vision. Throughout the                            procedure, the patient's blood pressure, pulse, and                            oxygen saturations were monitored continuously. The                            Endoscope was introduced through the mouth, and                            advanced to the second part of duodenum. The  upper                            GI endoscopy was accomplished without difficulty.                            The patient tolerated the procedure well. Scope In: Scope Out: Findings:                 The examined esophagus was normal.                           Diffuse moderate inflammation characterized by                            erosions, erythema, friability and granularity was                            found in the entire examined stomach. Biopsies were                            taken with a cold forceps for histology.                           The duodenal bulb and second portion of the                            duodenum were normal. Complications:            No immediate complications. Estimated Blood Loss:     Estimated blood loss was minimal. Impression:               - Normal esophagus.                           - Gastritis. Biopsied.                           - Normal duodenal bulb and second portion of the  duodenum. Recommendation:           - Patient has a contact number available for                            emergencies. The signs and symptoms of potential                            delayed complications were discussed with the                            patient. Return to normal activities tomorrow.                            Written discharge instructions were provided to the                            patient.                           - Resume previous diet.                           - Continue present medications.                           - Pantoprazole 40 mg po bid, 1 year of refills.                           - Await pathology results.                           - Return to GI office in 6 weeks. Ladene Artist, MD 02/05/2020 8:43:54 AM This report has been signed electronically.

## 2020-02-05 NOTE — Progress Notes (Signed)
Vs CW ° °

## 2020-02-05 NOTE — Progress Notes (Signed)
Report to PACU, RN, vss, BBS= Clear.  

## 2020-02-05 NOTE — Progress Notes (Signed)
Called to room to assist during endoscopic procedure.  Patient ID and intended procedure confirmed with present staff. Received instructions for my participation in the procedure from the performing physician.  

## 2020-02-09 ENCOUNTER — Telehealth: Payer: Self-pay

## 2020-02-09 NOTE — Telephone Encounter (Signed)
  Follow up Call-  Call back number 02/05/2020  Post procedure Call Back phone  # [phone]  Permission to leave phone message Yes  Some recent data might be hidden     1st follow up call made.  NALM

## 2020-02-09 NOTE — Telephone Encounter (Signed)
  Follow up Call-  Call back number 02/05/2020  Post procedure Call Back phone  # [phone]  Permission to leave phone message Yes  Some recent data might be hidden     Patient questions:  Do you have a fever, pain , or abdominal swelling? No. Pain Score  0 *  Have you tolerated food without any problems? Yes.    Have you been able to return to your normal activities? Yes.    Do you have any questions about your discharge instructions: Diet   No. Medications  No. Follow up visit  No.  Do you have questions or concerns about your Care? No.  Actions: * If pain score is 4 or above: No action needed, pain <4.  1. Have you developed a fever since your procedure? No  2.   Have you had an respiratory symptoms (SOB or cough) since your procedure? No 3.   Have you tested positive for COVID 19 since your procedure No  4.   Have you had any family members/close contacts diagnosed with the COVID 19 since your procedure? No  If yes to any of these questions please route to Joylene John, RN and Joella Prince, RN

## 2020-02-19 DIAGNOSIS — H401231 Low-tension glaucoma, bilateral, mild stage: Secondary | ICD-10-CM | POA: Diagnosis not present

## 2020-02-19 DIAGNOSIS — E119 Type 2 diabetes mellitus without complications: Secondary | ICD-10-CM | POA: Diagnosis not present

## 2020-02-19 DIAGNOSIS — H524 Presbyopia: Secondary | ICD-10-CM | POA: Diagnosis not present

## 2020-02-19 DIAGNOSIS — H5213 Myopia, bilateral: Secondary | ICD-10-CM | POA: Diagnosis not present

## 2020-02-19 LAB — HM DIABETES EYE EXAM

## 2020-02-25 ENCOUNTER — Telehealth: Payer: Self-pay

## 2020-02-25 ENCOUNTER — Other Ambulatory Visit: Payer: Self-pay

## 2020-02-25 MED ORDER — TALICIA 250-12.5-10 MG PO CPDR: DELAYED_RELEASE_CAPSULE | ORAL | 0 refills | Status: DC

## 2020-02-25 NOTE — Telephone Encounter (Signed)
Sent Talicia rx to [facility] and faxed documentation.

## 2020-02-26 ENCOUNTER — Encounter: Payer: Self-pay | Admitting: Internal Medicine

## 2020-02-26 NOTE — Progress Notes (Signed)
Outside notes received. Information abstracted. Notes sent to scan.  

## 2020-02-27 ENCOUNTER — Telehealth: Payer: Self-pay | Admitting: Gastroenterology

## 2020-02-27 NOTE — Telephone Encounter (Signed)
I left Barbaraann Share a detailed message that it was approved but over $160 so we are reaching out to the rep to see about options.

## 2020-02-27 NOTE — Telephone Encounter (Signed)
See additional phone note dated 02/27/20

## 2020-02-27 NOTE — Telephone Encounter (Signed)
Patient's wife called back stating they already called the pharmacy and are having the medication mailed to them since patient does need it.

## 2020-03-22 ENCOUNTER — Other Ambulatory Visit: Payer: Self-pay | Admitting: Internal Medicine

## 2020-04-07 ENCOUNTER — Ambulatory Visit: Payer: Medicare Other | Admitting: Gastroenterology

## 2020-04-07 ENCOUNTER — Other Ambulatory Visit: Payer: Medicare Other

## 2020-04-07 ENCOUNTER — Encounter: Payer: Self-pay | Admitting: Gastroenterology

## 2020-04-07 VITALS — BP 130/74 | HR 84 | Ht 68.25 in | Wt 181.0 lb

## 2020-04-07 DIAGNOSIS — K2970 Gastritis, unspecified, without bleeding: Secondary | ICD-10-CM

## 2020-04-07 DIAGNOSIS — B9681 Helicobacter pylori [H. pylori] as the cause of diseases classified elsewhere: Secondary | ICD-10-CM | POA: Diagnosis not present

## 2020-04-07 MED ORDER — ONDANSETRON HCL 4 MG PO TABS: 4.0000 mg | ORAL_TABLET | Freq: Three times a day (TID) | ORAL | 1 refills | Status: DC | PRN

## 2020-04-07 NOTE — Progress Notes (Signed)
    History of Present Illness: This is a 75 year old male returning for follow-up of H. pylori gastritis.  Diagnosis was established at EGD with biopsies in mid November.  He was treated with a 96-EXB course of Talicia.  He has intermittent mild epigastric pain and this is substantially improved compared to last fall.  He has occasional episodes of nausea.   Current Medications, Allergies, Past Medical History, Past Surgical History, Family History and Social History were reviewed in [employer] record.   Physical Exam: General: Well developed, well nourished, no acute distress Head: Normocephalic and atraumatic Eyes:  sclerae anicteric, EOMI Ears: Normal auditory acuity Mouth: Not examined, mask on during Covid-19 pandemic Lungs: Clear throughout to auscultation Heart: Regular rate and rhythm; no murmurs, rubs or bruits Abdomen: Soft, non tender and non distended. No masses, hepatosplenomegaly or hernias noted. Normal Bowel sounds Rectal: Not done Musculoskeletal: Symmetrical with no gross deformities  Pulses:  Normal pulses noted Extremities: No clubbing, cyanosis, edema or deformities noted Neurological: Alert oriented x 4, grossly nonfocal Psychological:  Alert and cooperative. Normal mood and affect   Assessment and Recommendations:  1. H pylori gastritis, treated with 14 day course of Talicia.  Check H. pylori stool antigen at least 2 weeks off pantoprazole.  Following the H. pylori stool antigen test resume pantoprazole 40 mg p.o. daily.  Zofran 4 mg PO 3 times daily as needed for nausea, vomiting.  If H. pylori stool antigen is negative he will return to his PCP for ongoing care and have GI follow-up as needed.

## 2020-04-07 NOTE — Patient Instructions (Signed)
Your provider has requested that you go to the basement level for lab work before leaving today. Press "B" on the elevator. The lab is located at the first door on the left as you exit the elevator.  Please remain off of pantoprazole for 2 weeks prior to doing the stool H. Pylori test. Then resume afterwards only taking pantoprazole 40 mg daily.   We have sent the following medications to your pharmacy for you to pick up at your convenience: zofran.  Due to recent changes in healthcare laws, you may see the results of your imaging and laboratory studies on MyChart before your provider has had a chance to review them.  We understand that in some cases there may be results that are confusing or concerning to you. Not all laboratory results come back in the same time frame and the provider may be waiting for multiple results in order to interpret others.  Please give [place] 48 hours in order for your provider to thoroughly review all the results before contacting the office for clarification of your results.   Thank you for choosing me and [facility].  Pricilla Riffle. Dagoberto Ligas., MD., Marval Regal

## 2020-04-15 ENCOUNTER — Telehealth: Payer: Self-pay | Admitting: Gastroenterology

## 2020-04-15 NOTE — Telephone Encounter (Signed)
Returned [employer] by phone number listed and Clare Gandy from [place] states he is unsure why Tiffany requested a call back. Just wanted to inform our office that Zofran prior authorization was denied. Dr. Fuller Plan please advise what else you would like to prescribe in it's place.

## 2020-04-15 NOTE — Telephone Encounter (Signed)
Tiffany from [facility] is requesting a call back from a nurse in regards to the Zofran denial.  She will be faxing over the denial information as well.  CB [phone] Opt 5

## 2020-04-15 NOTE — Telephone Encounter (Signed)
Phenergan 12.5 mg po tid prn nausea, vomiting, #60, 1 refill

## 2020-04-16 MED ORDER — PROMETHAZINE HCL 12.5 MG PO TABS: 12.5000 mg | ORAL_TABLET | Freq: Three times a day (TID) | ORAL | 1 refills | Status: DC | PRN

## 2020-04-16 NOTE — Telephone Encounter (Signed)
Prescription sent to patient's pharmacy. Left message for patient to return my call. 

## 2020-04-18 ENCOUNTER — Encounter: Payer: Self-pay | Admitting: Internal Medicine

## 2020-04-19 NOTE — Telephone Encounter (Signed)
Sent mychart message

## 2020-04-21 ENCOUNTER — Other Ambulatory Visit: Payer: Self-pay | Admitting: Internal Medicine

## 2020-04-22 MED ORDER — METFORMIN HCL 500 MG PO TABS: ORAL_TABLET | ORAL | Status: DC

## 2020-04-23 ENCOUNTER — Encounter: Payer: Self-pay | Admitting: Internal Medicine

## 2020-04-23 NOTE — Progress Notes (Signed)
Outside notes received. Information abstracted. Notes sent to scan.  

## 2020-04-24 ENCOUNTER — Telehealth: Payer: Self-pay | Admitting: Internal Medicine

## 2020-04-27 ENCOUNTER — Other Ambulatory Visit: Payer: Self-pay

## 2020-04-27 ENCOUNTER — Other Ambulatory Visit: Payer: Medicare Other

## 2020-04-27 ENCOUNTER — Other Ambulatory Visit: Payer: Self-pay | Admitting: Internal Medicine

## 2020-04-27 DIAGNOSIS — B9681 Helicobacter pylori [H. pylori] as the cause of diseases classified elsewhere: Secondary | ICD-10-CM | POA: Diagnosis not present

## 2020-04-27 DIAGNOSIS — K2970 Gastritis, unspecified, without bleeding: Secondary | ICD-10-CM | POA: Diagnosis not present

## 2020-04-27 MED ORDER — METFORMIN HCL 500 MG PO TABS: ORAL_TABLET | ORAL | 1 refills | Status: DC

## 2020-04-27 NOTE — Telephone Encounter (Signed)
Re-faxed today.

## 2020-04-27 NOTE — Telephone Encounter (Signed)
[facility] called and said that they did not receive a refill for metFORMIN (GLUCOPHAGE) 500 MG tablet. They are requesting that it be sent again. Please advise

## 2020-04-28 LAB — HELICOBACTER PYLORI  SPECIAL ANTIGEN
MICRO NUMBER:: 11508658
SPECIMEN QUALITY: ADEQUATE

## 2020-04-30 ENCOUNTER — Other Ambulatory Visit: Payer: Self-pay

## 2020-04-30 DIAGNOSIS — R109 Unspecified abdominal pain: Secondary | ICD-10-CM

## 2020-04-30 DIAGNOSIS — R112 Nausea with vomiting, unspecified: Secondary | ICD-10-CM

## 2020-04-30 DIAGNOSIS — R634 Abnormal weight loss: Secondary | ICD-10-CM

## 2020-04-30 NOTE — Progress Notes (Signed)
Ladene Artist, MD sent to Marlon Pel, RN H pylori stool test was negative indicating no H pylori.  The reason for ongoing N/V is not clear.  Continue pantoprazole 40 mg bid and promethazine 12.5 mt tid.  CMP, CBC, lipase, TSH  Schedule CT AP

## 2020-05-04 NOTE — Telephone Encounter (Signed)
Dr. Fuller Plan. Patient reports he had a CT with Dr. Quay Burow in September.  He wants to make sure that you still want him to proceed with a repeat study now.

## 2020-05-06 ENCOUNTER — Other Ambulatory Visit (INDEPENDENT_AMBULATORY_CARE_PROVIDER_SITE_OTHER): Payer: Medicare Other

## 2020-05-06 DIAGNOSIS — R109 Unspecified abdominal pain: Secondary | ICD-10-CM | POA: Diagnosis not present

## 2020-05-06 DIAGNOSIS — R634 Abnormal weight loss: Secondary | ICD-10-CM

## 2020-05-06 DIAGNOSIS — R112 Nausea with vomiting, unspecified: Secondary | ICD-10-CM | POA: Diagnosis not present

## 2020-05-06 LAB — COMPREHENSIVE METABOLIC PANEL
ALT: 16 U/L (ref 0–53)
AST: 19 U/L (ref 0–37)
Albumin: 4.1 g/dL (ref 3.5–5.2)
Alkaline Phosphatase: 46 U/L (ref 39–117)
BUN: 20 mg/dL (ref 6–23)
CO2: 27 mEq/L (ref 19–32)
Calcium: 9.8 mg/dL (ref 8.4–10.5)
Chloride: 107 mEq/L (ref 96–112)
Creatinine, Ser: 1.21 mg/dL (ref 0.40–1.50)
GFR: 58.66 mL/min — ABNORMAL LOW (ref >60.00)
Glucose, Bld: 131 mg/dL — ABNORMAL HIGH (ref 70–99)
Potassium: 4.3 mEq/L (ref 3.5–5.1)
Sodium: 141 mEq/L (ref 135–145)
Total Bilirubin: 0.4 mg/dL (ref 0.2–1.2)
Total Protein: 7 g/dL (ref 6.0–8.3)

## 2020-05-06 LAB — CBC WITH DIFFERENTIAL/PLATELET
Basophils Absolute: 0.1 10*3/uL (ref 0.0–0.1)
Basophils Relative: 0.7 % (ref 0.0–3.0)
Eosinophils Absolute: 0.4 10*3/uL (ref 0.0–0.7)
Eosinophils Relative: 4.3 % (ref 0.0–5.0)
HCT: 32.8 % — ABNORMAL LOW (ref 39.0–52.0)
Hemoglobin: 11 g/dL — ABNORMAL LOW (ref 13.0–17.0)
Lymphocytes Relative: 22.1 % (ref 12.0–46.0)
Lymphs Abs: 1.8 10*3/uL (ref 0.7–4.0)
MCHC: 33.7 g/dL (ref 30.0–36.0)
MCV: 83.3 fl (ref 78.0–100.0)
Monocytes Absolute: 0.7 10*3/uL (ref 0.1–1.0)
Monocytes Relative: 8.3 % (ref 3.0–12.0)
Neutro Abs: 5.4 10*3/uL (ref 1.4–7.7)
Neutrophils Relative %: 64.6 % (ref 43.0–77.0)
Platelets: 197 10*3/uL (ref 150.0–400.0)
RBC: 3.94 Mil/uL — ABNORMAL LOW (ref 4.22–5.81)
RDW: 14.4 % (ref 11.5–15.5)
WBC: 8.3 10*3/uL (ref 4.0–10.5)

## 2020-05-06 LAB — LIPASE: Lipase: 22 U/L (ref 11.0–59.0)

## 2020-05-06 LAB — TSH: TSH: 0.78 u[IU]/mL (ref 0.35–4.50)

## 2020-05-07 ENCOUNTER — Telehealth: Payer: Self-pay | Admitting: Gastroenterology

## 2020-05-07 NOTE — Telephone Encounter (Signed)
Inbound call from patients' wife returning your call in regards to their lab results.

## 2020-05-10 ENCOUNTER — Ambulatory Visit (HOSPITAL_BASED_OUTPATIENT_CLINIC_OR_DEPARTMENT_OTHER): Payer: Medicare Other

## 2020-05-10 NOTE — Telephone Encounter (Signed)
See results notes for details.  

## 2020-05-19 ENCOUNTER — Ambulatory Visit: Payer: Medicare Other | Admitting: Physician Assistant

## 2020-05-19 ENCOUNTER — Other Ambulatory Visit (INDEPENDENT_AMBULATORY_CARE_PROVIDER_SITE_OTHER): Payer: Medicare Other

## 2020-05-19 ENCOUNTER — Encounter: Payer: Self-pay | Admitting: Physician Assistant

## 2020-05-19 VITALS — BP 152/72 | HR 70 | Ht 68.25 in | Wt 182.6 lb

## 2020-05-19 DIAGNOSIS — R1013 Epigastric pain: Secondary | ICD-10-CM

## 2020-05-19 DIAGNOSIS — R112 Nausea with vomiting, unspecified: Secondary | ICD-10-CM

## 2020-05-19 LAB — COMPREHENSIVE METABOLIC PANEL
ALT: 25 U/L (ref 0–53)
AST: 24 U/L (ref 0–37)
Albumin: 3.9 g/dL (ref 3.5–5.2)
Alkaline Phosphatase: 46 U/L (ref 39–117)
BUN: 20 mg/dL (ref 6–23)
CO2: 33 mEq/L — ABNORMAL HIGH (ref 19–32)
Calcium: 9.3 mg/dL (ref 8.4–10.5)
Chloride: 101 mEq/L (ref 96–112)
Creatinine, Ser: 1.27 mg/dL (ref 0.40–1.50)
GFR: 55.34 mL/min — ABNORMAL LOW (ref >60.00)
Glucose, Bld: 157 mg/dL — ABNORMAL HIGH (ref 70–99)
Potassium: 4.5 mEq/L (ref 3.5–5.1)
Sodium: 138 mEq/L (ref 135–145)
Total Bilirubin: 0.4 mg/dL (ref 0.2–1.2)
Total Protein: 6.7 g/dL (ref 6.0–8.3)

## 2020-05-19 NOTE — Patient Instructions (Addendum)
If you are age 76 or older, your body mass index should be between 23-30. Your Body mass index is 27.56 kg/m. If this is out of the aforementioned range listed, please consider follow up with your Primary Care Provider.  If you are age 39 or younger, your body mass index should be between 19-25. Your Body mass index is 27.56 kg/m. If this is out of the aformentioned range listed, please consider follow up with your Primary Care Provider.   Your provider has requested that you go to the basement level for lab work before leaving today. Press "B" on the elevator. The lab is located at the first door on the left as you exit the elevator.  Please continue pantoprazole and take your Promethazine twice daily.  You have been scheduled for a CT scan of the abdomen and pelvis at [facility], 1st floor Radiology. You are scheduled on 05/25/20  at 1:30pm. You should arrive 15 minutes prior to your appointment time for registration.  Please pick up 2 bottles of contrast from [place] at least 3 days prior to your scan. The solution may taste better if refrigerated, but do NOT add ice or any other liquid to this solution. Shake well before drinking.   Please follow the written instructions below on the day of your exam:   1) Do not eat anything after 9:30am (4 hours prior to your test)   2) Drink 1 bottle of contrast @ 11:30am (2 hours prior to your exam)  Remember to shake well before drinking and do NOT pour over ice.     Drink 1 bottle of contrast @ 12:30pm (1 hour prior to your exam)   You may take any medications as prescribed with a small amount of water, if necessary. If you take any of the following medications: METFORMIN, GLUCOPHAGE, GLUCOVANCE, AVANDAMET, RIOMET, FORTAMET, [place] MET, JANUMET, GLUMETZA or METAGLIP, you MAY be asked to HOLD this medication 48 hours AFTER the exam.   The purpose of you drinking the oral contrast is to aid in the visualization of your intestinal tract. The  contrast solution may cause some diarrhea. Depending on your individual set of symptoms, you may also receive an intravenous injection of x-ray contrast/dye. Plan on being at [facility] for 45 minutes or longer, depending on the type of exam you are having performed.   If you have any questions regarding your exam or if you need to reschedule, you may call [facility] at [phone] between the hours of 8:00 am and 5:00 pm, Monday-Friday.        Due to recent changes in healthcare laws, you may see the results of your imaging and laboratory studies on MyChart before your provider has had a chance to review them.  We understand that in some cases there may be results that are confusing or concerning to you. Not all laboratory results come back in the same time frame and the provider may be waiting for multiple results in order to interpret others.  Please give [place] 48 hours in order for your provider to thoroughly review all the results before contacting the office for clarification of your results.   Thank you for choosing me and [facility].  Ellouise Newer, PA-C
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
## 2020-05-19 NOTE — Progress Notes (Signed)
Reviewed and agree with management plan.  Clayton Bosserman T. Landis Dowdy, MD FACG [phone]  

## 2020-05-20 ENCOUNTER — Encounter: Payer: Self-pay | Admitting: Internal Medicine

## 2020-05-22 ENCOUNTER — Other Ambulatory Visit: Payer: Self-pay | Admitting: Internal Medicine

## 2020-05-25 ENCOUNTER — Other Ambulatory Visit: Payer: Self-pay

## 2020-05-25 ENCOUNTER — Ambulatory Visit (HOSPITAL_COMMUNITY)
Admission: RE | Admit: 2020-05-25 | Discharge: 2020-05-25 | Disposition: A | Discharge status: Home / Self Care | Payer: Medicare Other | Source: Ambulatory Visit | Attending: Gastroenterology | Admitting: Gastroenterology

## 2020-05-25 DIAGNOSIS — R634 Abnormal weight loss: Secondary | ICD-10-CM | POA: Insufficient documentation

## 2020-05-25 DIAGNOSIS — K760 Fatty (change of) liver, not elsewhere classified: Secondary | ICD-10-CM | POA: Diagnosis not present

## 2020-05-25 DIAGNOSIS — R109 Unspecified abdominal pain: Secondary | ICD-10-CM

## 2020-05-25 DIAGNOSIS — R112 Nausea with vomiting, unspecified: Secondary | ICD-10-CM | POA: Diagnosis not present

## 2020-05-25 MED ORDER — IOHEXOL 300 MG/ML  SOLN
100.0000 mL | Freq: Once | INTRAMUSCULAR | Status: AC | PRN
  Administered 2020-05-25: 100 mL via INTRAVENOUS

## 2020-06-08 ENCOUNTER — Other Ambulatory Visit: Payer: Self-pay | Admitting: Internal Medicine

## 2020-07-01 DIAGNOSIS — H401231 Low-tension glaucoma, bilateral, mild stage: Secondary | ICD-10-CM | POA: Diagnosis not present

## 2020-07-13 ENCOUNTER — Ambulatory Visit: Payer: Medicare Other | Admitting: Gastroenterology

## 2020-07-13 ENCOUNTER — Encounter: Payer: Self-pay | Admitting: Gastroenterology

## 2020-07-13 VITALS — BP 138/72 | HR 53 | Ht 68.25 in | Wt 185.0 lb

## 2020-07-13 DIAGNOSIS — R1013 Epigastric pain: Secondary | ICD-10-CM | POA: Diagnosis not present

## 2020-07-13 DIAGNOSIS — R112 Nausea with vomiting, unspecified: Secondary | ICD-10-CM | POA: Diagnosis not present

## 2020-07-13 DIAGNOSIS — K8020 Calculus of gallbladder without cholecystitis without obstruction: Secondary | ICD-10-CM | POA: Diagnosis not present

## 2020-07-13 NOTE — Progress Notes (Signed)
    History of Present Illness: This is a 75 year old male returning for problems with epigastric pain, nausea and occasional vomiting.  Symptoms have improved substantially.  He was treated for H. pylori gastritis and had a negative H. pylori fecal antigen after treatment. Lower dose of metformin, 450 mg po bid, seems to have resolved his symptoms.   CT AP 05/26/2020 1. No acute findings in the abdomen or pelvis. 2. Colonic diverticulosis without evidence of acute diverticulitis. 3. Moderate volume of formed stool throughout the colon. 4. Cholelithiasis without findings of acute cholecystitis. 5. Hepatic steatosis. 6. Aortic atherosclerosis.   EGD 01/2020 - Normal esophagus. - Gastritis. Biopsied. H pylori gastritis  - Normal duodenal bulb and second portion of the duodenum.  Current Medications, Allergies, Past Medical History, Past Surgical History, Family History and Social History were reviewed in [employer] record.   Physical Exam: General: Well developed, well nourished, no acute distress Head: Normocephalic and atraumatic Eyes: Sclerae anicteric, EOMI Ears: Normal auditory acuity Mouth: Not examined, mask on during Covid-19 pandemic Lungs: Clear throughout to auscultation Heart: Regular rate and rhythm; no murmurs, rubs or bruits Abdomen: Soft, non tender and non distended. No masses, hepatosplenomegaly or hernias noted. Normal Bowel sounds Rectal: Not done Musculoskeletal: Symmetrical with no gross deformities  Pulses:  Normal pulses noted Extremities: No clubbing, cyanosis, edema or deformities noted Neurological: Alert oriented x 4, grossly nonfocal Psychological:  Alert and cooperative. Normal mood and affect   Assessment and Recommendations:  1.  Nausea, vomiting, epigastric pain.  Symptoms have almost completely resolved since decreasing his metformin dose to 450 mg bid.  H. pylori gastritis was treated and eradicated.  Cholelithiasis noted  on imaging.  If symptoms recur consider symptomatic cholelithiasis as a possibility.  2.  Hepatic steatosis.  Long-term carb modified, fat modified diet and maintain a normal body weight followed by his PCP.  Optimal control of diabetes mellitus and lipids under the care of his PCP.  3.  Mild constipation.  Continue daily stool softeners and use MiraLAX daily as needed.

## 2020-07-13 NOTE — Patient Instructions (Signed)
Follow up as needed.   Thank you for choosing me and [facility].  Malcolm T. Stark, Jr., MD., FACG  

## 2020-08-02 ENCOUNTER — Ambulatory Visit: Payer: Medicare Other | Admitting: Internal Medicine

## 2020-08-16 ENCOUNTER — Encounter: Payer: Self-pay | Admitting: Internal Medicine

## 2020-10-02 ENCOUNTER — Other Ambulatory Visit: Payer: Self-pay | Admitting: Internal Medicine

## 2020-11-09 ENCOUNTER — Other Ambulatory Visit: Payer: Self-pay | Admitting: Internal Medicine

## 2020-12-24 ENCOUNTER — Other Ambulatory Visit: Payer: Self-pay | Admitting: Internal Medicine

## 2020-12-28 ENCOUNTER — Encounter: Payer: Self-pay | Admitting: Internal Medicine

## 2020-12-29 MED ORDER — GLIPIZIDE 5 MG PO TABS: 5.0000 mg | ORAL_TABLET | Freq: Two times a day (BID) | ORAL | 5 refills | Status: DC

## 2021-01-03 ENCOUNTER — Other Ambulatory Visit: Payer: Self-pay | Admitting: Internal Medicine

## 2021-01-06 DIAGNOSIS — H401231 Low-tension glaucoma, bilateral, mild stage: Secondary | ICD-10-CM | POA: Diagnosis not present

## 2021-01-31 ENCOUNTER — Other Ambulatory Visit: Payer: Self-pay | Admitting: Internal Medicine

## 2021-02-01 ENCOUNTER — Other Ambulatory Visit: Payer: Self-pay | Admitting: Internal Medicine

## 2021-02-01 ENCOUNTER — Encounter: Payer: Self-pay | Admitting: Internal Medicine

## 2021-02-12 ENCOUNTER — Other Ambulatory Visit: Payer: Self-pay | Admitting: Gastroenterology

## 2021-02-12 DIAGNOSIS — R1013 Epigastric pain: Secondary | ICD-10-CM

## 2021-02-14 ENCOUNTER — Encounter: Payer: Self-pay | Admitting: Internal Medicine

## 2021-02-14 DIAGNOSIS — M549 Dorsalgia, unspecified: Secondary | ICD-10-CM | POA: Insufficient documentation

## 2021-02-14 DIAGNOSIS — G8929 Other chronic pain: Secondary | ICD-10-CM | POA: Insufficient documentation

## 2021-02-14 NOTE — Patient Instructions (Addendum)
  Blood work was ordered.     Medications changes include :   none- will adjust medication after your blood work  Your prescription(s) have been submitted to your pharmacy. Please take as directed and contact our office if you believe you are having problem(s) with the medication(s).    Please followup in 3 months
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
## 2021-02-15 ENCOUNTER — Ambulatory Visit (INDEPENDENT_AMBULATORY_CARE_PROVIDER_SITE_OTHER): Payer: Medicare Other | Admitting: Internal Medicine

## 2021-02-15 ENCOUNTER — Other Ambulatory Visit: Payer: Self-pay

## 2021-02-15 VITALS — BP 140/80 | HR 67 | Temp 98.0°F | Ht 68.25 in | Wt 191.0 lb

## 2021-02-15 DIAGNOSIS — M545 Low back pain, unspecified: Secondary | ICD-10-CM

## 2021-02-15 DIAGNOSIS — E1142 Type 2 diabetes mellitus with diabetic polyneuropathy: Secondary | ICD-10-CM | POA: Diagnosis not present

## 2021-02-15 DIAGNOSIS — Z1331 Encounter for screening for depression: Secondary | ICD-10-CM | POA: Diagnosis not present

## 2021-02-15 DIAGNOSIS — D649 Anemia, unspecified: Secondary | ICD-10-CM | POA: Diagnosis not present

## 2021-02-15 DIAGNOSIS — E782 Mixed hyperlipidemia: Secondary | ICD-10-CM

## 2021-02-15 DIAGNOSIS — I10 Essential (primary) hypertension: Secondary | ICD-10-CM | POA: Diagnosis not present

## 2021-02-15 DIAGNOSIS — R110 Nausea: Secondary | ICD-10-CM | POA: Insufficient documentation

## 2021-02-15 DIAGNOSIS — G8929 Other chronic pain: Secondary | ICD-10-CM

## 2021-02-15 LAB — LIPID PANEL
Cholesterol: 102 mg/dL (ref 0–200)
HDL: 32.6 mg/dL — ABNORMAL LOW (ref >39.00)
LDL Cholesterol: 41 mg/dL (ref 0–99)
NonHDL: 69.68
Total CHOL/HDL Ratio: 3
Triglycerides: 142 mg/dL (ref 0.0–149.0)
VLDL: 28.4 mg/dL (ref 0.0–40.0)

## 2021-02-15 LAB — COMPREHENSIVE METABOLIC PANEL
ALT: 18 U/L (ref 0–53)
AST: 20 U/L (ref 0–37)
Albumin: 4.2 g/dL (ref 3.5–5.2)
Alkaline Phosphatase: 64 U/L (ref 39–117)
BUN: 20 mg/dL (ref 6–23)
CO2: 28 mEq/L (ref 19–32)
Calcium: 9.5 mg/dL (ref 8.4–10.5)
Chloride: 107 mEq/L (ref 96–112)
Creatinine, Ser: 1.5 mg/dL (ref 0.40–1.50)
GFR: 45.08 mL/min — ABNORMAL LOW (ref >60.00)
Glucose, Bld: 183 mg/dL — ABNORMAL HIGH (ref 70–99)
Potassium: 4.7 mEq/L (ref 3.5–5.1)
Sodium: 141 mEq/L (ref 135–145)
Total Bilirubin: 0.5 mg/dL (ref 0.2–1.2)
Total Protein: 7 g/dL (ref 6.0–8.3)

## 2021-02-15 LAB — CBC WITH DIFFERENTIAL/PLATELET
Basophils Absolute: 0 10*3/uL (ref 0.0–0.1)
Basophils Relative: 0.6 % (ref 0.0–3.0)
Eosinophils Absolute: 0.4 10*3/uL (ref 0.0–0.7)
Eosinophils Relative: 5.1 % — ABNORMAL HIGH (ref 0.0–5.0)
HCT: 35.2 % — ABNORMAL LOW (ref 39.0–52.0)
Hemoglobin: 11.7 g/dL — ABNORMAL LOW (ref 13.0–17.0)
Lymphocytes Relative: 21.1 % (ref 12.0–46.0)
Lymphs Abs: 1.7 10*3/uL (ref 0.7–4.0)
MCHC: 33.2 g/dL (ref 30.0–36.0)
MCV: 82.9 fl (ref 78.0–100.0)
Monocytes Absolute: 0.6 10*3/uL (ref 0.1–1.0)
Monocytes Relative: 7.1 % (ref 3.0–12.0)
Neutro Abs: 5.2 10*3/uL (ref 1.4–7.7)
Neutrophils Relative %: 66.1 % (ref 43.0–77.0)
Platelets: 182 10*3/uL (ref 150.0–400.0)
RBC: 4.24 Mil/uL (ref 4.22–5.81)
RDW: 14.3 % (ref 11.5–15.5)
WBC: 7.9 10*3/uL (ref 4.0–10.5)

## 2021-02-15 LAB — HEMOGLOBIN A1C: Hgb A1c MFr Bld: 9.5 % — ABNORMAL HIGH (ref 4.6–6.5)

## 2021-02-15 LAB — MICROALBUMIN / CREATININE URINE RATIO
Creatinine,U: 139.6 mg/dL
Microalb Creat Ratio: 12.9 mg/g (ref 0.0–30.0)
Microalb, Ur: 18.1 mg/dL — ABNORMAL HIGH (ref 0.0–1.9)

## 2021-02-15 MED ORDER — PROMETHAZINE HCL 12.5 MG PO TABS: 12.5000 mg | ORAL_TABLET | Freq: Three times a day (TID) | ORAL | 5 refills | Status: AC | PRN

## 2021-02-15 MED ORDER — METFORMIN HCL 500 MG PO TABS: 500.0000 mg | ORAL_TABLET | Freq: Two times a day (BID) | ORAL | 1 refills | Status: DC

## 2021-02-15 NOTE — Assessment & Plan Note (Signed)
Chronic Regular exercise and healthy diet encouraged Check lipid panel  Continue Crestor 20 mg daily 

## 2021-02-15 NOTE — Assessment & Plan Note (Signed)
Chronic Blood pressure reasonably controlled CMP Continue carvedilol 25 mg twice daily, lisinopril 20 mg daily

## 2021-02-15 NOTE — Assessment & Plan Note (Signed)
Chronic Mild CBC, iron panel

## 2021-02-15 NOTE — Assessment & Plan Note (Signed)
Chronic Has seen GI in the past Has had intermittent vomiting in the past Taking Phenergan 12.5 mg twice daily before meals-we will continue The symptoms did improve when the dose of the metformin was decreased and at some point he may be able to come off this medication Refilled medication today

## 2021-02-15 NOTE — Assessment & Plan Note (Addendum)
Chronic He is not compliant with a low sugar/ diet Sugars have been very high at home over the past couple of months only most likely-often 400s-500s Stressed the importance of compliance with a diabetic diet He is not able to exercise regularly secondary to chronic back pain We will check A1c, urine microalbumin Continue metformin 500 mg twice daily AC-has not been able to tolerate a higher dose Continue glipizide 5 mg twice daily Depending on how high his A1c is may need to consider insulin we will look into other medications are covered working may be eligible for patient assistance-we will see if our pharmacist can assist with this

## 2021-02-15 NOTE — Assessment & Plan Note (Signed)
Chronic Limited in his ability to exercise

## 2021-02-16 LAB — IRON,TIBC AND FERRITIN PANEL
%SAT: 19 % (calc) — ABNORMAL LOW (ref 20–48)
Ferritin: 22 ng/mL — ABNORMAL LOW (ref 24–380)
Iron: 65 ug/dL (ref 50–180)
TIBC: 334 mcg/dL (calc) (ref 250–425)

## 2021-02-17 ENCOUNTER — Telehealth: Payer: Self-pay

## 2021-02-17 NOTE — Progress Notes (Signed)
    Chronic Care Management Pharmacy Assistant   Name: Donald Carlson  MRN: [id number] DOB: 05-06-1945  Donald Carlson is an 75 y.o. year old male who presents for his initial CCM visit with the clinical pharmacist.  Reason for Encounter: Initial Visit   Recent office visits:  02/15/21 Binnie Rail, MD-PCP (Essential hypertension) Blood work was ordered.  Medications changes include :   none Recent consult visits:  None ID  Hospital visits:  None in previous 6 months  Medications: Outpatient Encounter Medications as of 02/17/2021  Medication Sig   aspirin 81 MG tablet Take 1 tablet (81 mg total) by mouth 2 (two) times daily after a meal.   carvedilol (COREG) 25 MG tablet TAKE 1 TABLET BY MOUTH TWICE DAILY WITH MEALS   cholecalciferol (VITAMIN D) 1000 units tablet Take 1,000 Units by mouth daily.   docusate sodium (COLACE) 100 MG capsule Take 1 capsule (100 mg total) by mouth 2 (two) times daily.   Flaxseed, Linseed, (FLAX SEED OIL) 1000 MG CAPS Take 1,000 mg by mouth daily.   glipiZIDE (GLUCOTROL) 5 MG tablet Take 1 tablet (5 mg total) by mouth 2 (two) times daily before a meal.   glucose blood (ONE TOUCH ULTRA TEST) test strip Use to check blood sugars once a day and prn. Dx e11.9   Lancets (ONETOUCH ULTRASOFT) lancets Use as instructed to check sugar daily   latanoprost (XALATAN) 0.005 % ophthalmic solution Place 1 drop into both eyes at bedtime.   lisinopril (ZESTRIL) 20 MG tablet TAKE 1 TABLET(20 MG) BY MOUTH DAILY   metFORMIN (GLUCOPHAGE) 500 MG tablet Take 1 tablet (500 mg total) by mouth 2 (two) times daily with a meal.   Multiple Vitamins-Iron (MULTIVITAMINS WITH IRON) TABS tablet Take 1 tablet by mouth daily.   pantoprazole (PROTONIX) 40 MG tablet Take 40 mg by mouth 2 (two) times daily.   promethazine (PHENERGAN) 12.5 MG tablet Take 1 tablet (12.5 mg total) by mouth every 8 (eight) hours as needed for nausea or vomiting.   rosuvastatin (CRESTOR) 20 MG tablet TAKE 1  TABLET(20 MG) BY MOUTH DAILY   Saw Palmetto, Serenoa repens, (SAW PALMETTO PO) Take 1 tablet by mouth every morning.   vitamin B-12 (CYANOCOBALAMIN) 1000 MCG tablet Take 1,000 mcg by mouth daily.   No facility-administered encounter medications on file as of 02/17/2021.   Medication List:  aspirin 81 MG tablet carvedilol (COREG) 25 MG tablet-last fill 12/24/20 90ds cholecalciferol (VITAMIN D) 1000 units tablet docusate sodium (COLACE) 100 MG capsule Flaxseed, Linseed, (FLAX SEED OIL) 1000 MG CAPS glipiZIDE (GLUCOTROL) 5 MG tablet-last fill 12/29/20 90 ds glucose blood (ONE TOUCH ULTRA TEST) test strip Lancets (ONETOUCH ULTRASOFT) lancets latanoprost (XALATAN) 0.005 % ophthalmic solution-last fill 02/04/21 75ds lisinopril (ZESTRIL) 20 MG tablet-last fill 02/09/21 90ds metFORMIN (GLUCOPHAGE) 500 MG tablet-last fill 08/03/20 90 ds Multiple Vitamins-Iron (MULTIVITAMINS WITH IRON) TABS tablet pantoprazole (PROTONIX) 40 MG tablet-last fill 12/27/20 44 ds promethazine (PHENERGAN) 12.5 MG tablet rosuvastatin (CRESTOR) 20 MG tablet-last fill 02/01/21 30 ds Saw Palmetto, Serenoa repens, (SAW PALMETTO PO) vitamin B-12 (CYANOCOBALAMIN) 1000 MCG tablet  Care Gaps: Colonoscopy-NA Diabetic Foot Exam-07/27/17 Ophthalmology-02/19/20 Dexa Scan - NA Annual Well Visit - NA Micro albumin-NA Hemoglobin A1c- 02/15/21  Star Rating Drugs: rosuvastatin 20 MG-last fill 02/01/21 30 ds metFORMIN 500 MG-last fill 08/03/20 90 ds  Ethelene Hal Clinical Pharmacist Assistant [phone]

## 2021-02-19 ENCOUNTER — Encounter: Payer: Self-pay | Admitting: Internal Medicine

## 2021-02-21 ENCOUNTER — Encounter: Payer: Self-pay | Admitting: Internal Medicine

## 2021-02-22 ENCOUNTER — Other Ambulatory Visit: Payer: Self-pay | Admitting: Internal Medicine

## 2021-02-23 ENCOUNTER — Other Ambulatory Visit: Payer: Self-pay

## 2021-02-23 ENCOUNTER — Ambulatory Visit: Payer: Medicare Other

## 2021-02-23 DIAGNOSIS — I10 Essential (primary) hypertension: Secondary | ICD-10-CM

## 2021-02-23 DIAGNOSIS — E1142 Type 2 diabetes mellitus with diabetic polyneuropathy: Secondary | ICD-10-CM

## 2021-02-23 DIAGNOSIS — E782 Mixed hyperlipidemia: Secondary | ICD-10-CM

## 2021-02-23 NOTE — Patient Instructions (Signed)
Visit Information   Following is a copy of your full care plan:  Care Plan : [place]  Updates made by Tomasa Blase, RPH since 02/23/2021 12:00 AM     Problem: HTN, HLD, DM2, Nausea, Abdominal Pain   Priority: High  Onset Date: 02/23/2021     Long-Range Goal: Disease Management   Start Date: 02/23/2021  Expected End Date: 02/23/2022  This Visit's Progress: On track  Priority: High  Note:   Current Barriers:  Unable to independently afford treatment regimen Unable to achieve control of blood sugars    Pharmacist Clinical Goal(s):  Patient will achieve improvement in blood sugars as evidenced by blood sugar log / next A1c through collaboration with PharmD and provider.   Interventions: 1:1 collaboration with Binnie Rail, MD regarding development and update of comprehensive plan of care as evidenced by provider attestation and co-signature Inter-disciplinary care team collaboration (see longitudinal plan of care) Comprehensive medication review performed; medication list updated in electronic medical record  Hypertension (BP goal <140/90) -Controlled -Current treatment: Lisinopril 53m - 1 tablet daily  Carvedilol 216m- 1 tablet twice daily  -Medications previously tried: amlodipine, spironolactone,   -Current home readings: reports that BP has been averaging 130-140's/70s BP Readings from Last 3 Encounters:  02/15/21 140/80  07/13/20 138/72  05/19/20 (!) 152/72  -Current dietary habits: reports to a sodium reduced diet, drinks coffee in AM -Current exercise habits: nothing at this time, chronic back pain prevents him from exercising  -Denies hypotensive/hypertensive symptoms -Educated on BP goals and benefits of medications for prevention of heart attack, stroke and kidney damage; Daily salt intake goal < 2300 mg; Importance of home blood pressure monitoring; Proper BP monitoring technique; Symptoms of hypotension and importance of maintaining adequate  hydration; -Counseled to monitor BP at home at least once weekly, document, and provide log at future appointments -Counseled on diet and exercise extensively Recommended to continue current medication  Hyperlipidemia: (LDL goal < 70) -Controlled Lab Results  Component Value Date   LDLCALC 41 02/15/2021  -Current treatment: Rosuvastatin 201m 1 tablet daily ASA 31m16mily  -Medications previously tried: pravastatin  -Current dietary patterns: reports to eating fast food for dinner, hot dogs throughout the week often as well -Current exercise habits: nothing at this time, chronic back pain prevents him from exercising  -Educated on Cholesterol goals;  Benefits of statin for ASCVD risk reduction; Importance of limiting foods high in cholesterol; -Counseled on diet and exercise extensively Recommended to continue current medication  Diabetes (A1c goal <7%) -Uncontrolled Lab Results  Component Value Date   HGBA1C 9.5 (H) 02/15/2021  -Current medications: Metformin 500mg45m tablet twice daily  Glipizide 5mg -43mtablet twice daily  -Medications previously tried: glimepiride, januvia, actos, rybelsus   -Current home glucose readings fasting glucose: 380-500 post prandial glucose: 300-500 -Denies hypoglycemic/hyperglycemic symptoms -Current meal patterns:  breakfast: boiled egg, sandwich, chipped beef and gravy, coffee with cream and sugar sweetener, orange juice   lunch: half a chicken salad sandwich and crackers / hot dog   dinner: eating fast food often - salad on occasion, BBQ,  snacks: peanutbutter crackers, canned fruit, chips  drinks: coffee and water, reports that before last PCP appointment was drinking sodas  -Current exercise: nothing at this time, chronic back pain prevents him from exercising  -Educated on A1c and blood sugar goals; Complications of diabetes including kidney damage, retinal damage, and cardiovascular disease; Exercise goal of 150 minutes per  week;  Benefits of weight loss; Prevention and management of hypoglycemic episodes; Benefits of routine self-monitoring of blood sugar; Carbohydrate counting and/or plate method -Counseled to check feet daily and get yearly eye exams -Recommended for patient to start on trulicity 4.09BD weekly, patient had previously been better controlled on januvia but had to stop due to cost, I believe that BG are quite elevated at this time and [place] would not be able to provide enough BG lower to bring into range.  Would not recommend SGLT2 start in setting of elevated BG due to high likelihood of UTI/ yeast infection -Patient reports that nausea and gastric pains are under control at this time, counseled patient that GLP1 class can cause stomach pains/ nausea, counseled to eat smaller meals to help mitigate risk - confirmed no history of pancreatitis / no family history of thyroid cancer  -Recommended for patient to increase glipizide to 50m BID with meals for now - start checking blood sugars once daily   Nausea/ Gastric Pain (Goal: Prevention of nausea / control of gastric pain) -Controlled -Current treatment  Pantoprazole 429m- 1 tablet twice daily   Promethazine 12.74m32m 1 tablet every 8 hours as needed  -Medications previously tried: omeprazole, ondansetron   -Recommended to continue current medication  Health Maintenance -Vaccine gaps: Shingles, COVID, Tdap booster -Current therapy:  Docusate 100m64m1 capsule every other day  Vitamin D 1000 units daily  Flax See Oil 1000mg72mly  Saw Palmetto - 1 tablet daily  Vitamin B12 - 1000mcg4mly Multivitamin with Iron - 1 tablet daily  -Educated on Cost vs benefit of each product must be carefully weighed by individual consumer -Patient is satisfied with current therapy and denies issues -Recommended to continue current medication  Patient Goals/Self-Care Activities Patient will:  - take medications as prescribed as evidenced by patient  report and record review focus on medication adherence by taking all medications as directed check glucose once daily, document, and provide at future appointments check blood pressure once weekly, document, and provide at future appointments collaborate with provider on medication access solutions engage in dietary modifications by reducing carbohydrate intake to help better control BG  Follow Up Plan: Telephone follow up appointment with care management team member scheduled for: 1 month The patient has been provided with contact information for the care management team and has been advised to call with any health related questions or concerns.     Consent to CCM Services: Mr. WelborRabenoldiven information about Chronic Care Management services including:  CCM service includes personalized support from designated clinical staff supervised by his physician, including individualized plan of care and coordination with other care providers 24/7 contact phone numbers for assistance for urgent and routine care needs. Service will only be billed when office clinical staff spend 20 minutes or more in a month to coordinate care. Only one practitioner may furnish and bill the service in a calendar month. The patient may stop CCM services at any time (effective at the end of the month) by phone call to the office staff. The patient will be responsible for cost sharing (co-pay) of up to 20% of the service fee (after annual deductible is met).  Patient agreed to services and verbal consent obtained.   Plan: Telephone follow up appointment with care management team member scheduled for:  1 month The patient has been provided with contact information for the care management team and has been advised to call with any health related questions or concerns.  Tomasa Blase, PharmD Clinical Pharmacist, [facility]   Please call the care guide team at [phone] if you need to cancel or  reschedule your appointment.   Patient verbalizes understanding of instructions provided today and agrees to view in [place].

## 2021-02-23 NOTE — Progress Notes (Signed)
Chronic Care Management Pharmacy Note  02/23/2021 Name:  Donald Carlson MRN:  [id number] DOB:  14-Apr-1945  Summary: -Patient reports that recently BG has been elevated - averaging in the 300-400's - notes that he has not been watching his diet, commonly drinking sodas throughout the day, is not active at home currently due to chronic pain -BP typically averaging 130-140/70's no issues with hypotension  -Stomach pains/ nausea under control   Recommendations/Changes made from today's visit: -Recommending at this time for patient to start trulicity 3.90ZE weekly - patient will be signed up for PAP through [facility] - educated on MOA and AE of trulicity - agreeable to plan - reviewed proper injection technique of trulicty - aware to reach out should he have questions when trulicity is received  -Advised for patient to increase glipizide to 106m BID before meals - reach out to office should BG remain >>092until trulicity is able to be started, patient to monitor blood sugars daily  -continue monitoring blood pressure at least once weekly, to reach out should BG >150/90  Plan: -F/u in 1 month  Subjective: Donald STORLIEis an 75y.o. year old male who is a primary patient of Burns, SClaudina Lick MD.  The CCM team was consulted for assistance with disease management and care coordination needs.    Engaged with patient by telephone for initial visit in response to provider referral for pharmacy case management and/or care coordination services.   Consent to Services:  The patient was given the following information about Chronic Care Management services today, agreed to services, and gave verbal consent: 1. CCM service includes personalized support from designated clinical staff supervised by the primary care provider, including individualized plan of care and coordination with other care providers 2. 24/7 contact phone numbers for assistance for urgent and routine care needs. 3. Service will  only be billed when office clinical staff spend 20 minutes or more in a month to coordinate care. 4. Only one practitioner may furnish and bill the service in a calendar month. 5.The patient may stop CCM services at any time (effective at the end of the month) by phone call to the office staff. 6. The patient will be responsible for cost sharing (co-pay) of up to 20% of the service fee (after annual deductible is met). Patient agreed to services and consent obtained.  Patient Care Team: BBinnie Rail MD as PCP - General (Internal Medicine) SRod Can MD as Consulting Physician (Orthopedic Surgery) GSharyne Peach MD as Consulting Physician (Ophthalmology)  Recent office visits: 02/15/2021 - Dr. BQuay Burow- not compliant with low sugar diet - BG commonly 400-500's - limited exercise due to chronic back pain - higher does of metformin cause nausea   Recent consult visits: 07/13/2020 - Dr. SDerrill Memo-Gertie Fey- evaluation of epigastric pain - pantoprazole d/cd   Hospital visits: None in previous 6 months  Objective:  Lab Results  Component Value Date   CREATININE 1.50 02/15/2021   BUN 20 02/15/2021   GFR 45.08 (L) 02/15/2021   GFRNONAA 58 (L) 12/01/2019   GFRAA 67 12/01/2019   NA 141 02/15/2021   K 4.7 02/15/2021   CALCIUM 9.5 02/15/2021   CO2 28 02/15/2021   GLUCOSE 183 (H) 02/15/2021    Lab Results  Component Value Date/Time   HGBA1C 9.5 (H) 02/15/2021 08:58 AM   HGBA1C 7.1 (H) 02/02/2020 10:03 AM   GFR 45.08 (L) 02/15/2021 08:58 AM   GFR 55.34 (L) 05/19/2020 11:25 AM  MICROALBUR 18.1 (H) 02/15/2021 08:58 AM   MICROALBUR 0.7 04/07/2015 03:42 PM    Last diabetic Eye exam:  Lab Results  Component Value Date/Time   HMDIABEYEEXA No Retinopathy 02/19/2020 12:00 AM   HMDIABEYEEXA No Retinopathy 02/19/2020 12:00 AM    Last diabetic Foot exam:  No results found for: HMDIABFOOTEX   Lab Results  Component Value Date   CHOL 102 02/15/2021   HDL 32.60 (L) 02/15/2021   LDLCALC  41 02/15/2021   LDLDIRECT 116.7 02/03/2014   TRIG 142.0 02/15/2021   CHOLHDL 3 02/15/2021    Hepatic Function Latest Ref Rng & Units 02/15/2021 05/19/2020 05/06/2020  Total Protein 6.0 - 8.3 g/dL 7.0 6.7 7.0  Albumin 3.5 - 5.2 g/dL 4.2 3.9 4.1  AST 0 - 37 U/L _0 ALT 0 - 53 U/L _1 Alk Phosphatase 39 - 117 U/L 64 46 46  Total Bilirubin 0.2 - 1.2 mg/dL 0.5 0.4 0.4  Bilirubin, Direct 0.0 - 0.3 mg/dL - - -    Lab Results  Component Value Date/Time   TSH 0.78 05/06/2020 02:20 PM   TSH 2.13 01/31/2019 09:21 AM    CBC Latest Ref Rng & Units 02/15/2021 05/06/2020 01/31/2019  WBC 4.0 - 10.5 K/uL 7.9 8.3 6.8  Hemoglobin 13.0 - 17.0 g/dL 11.7(L) 11.0(L) 12.7(L)  Hematocrit 39.0 - 52.0 % 35.2(L) 32.8(L) 37.6(L)  Platelets 150.0 - 400.0 K/uL 182.0 197.0 193.0    No results found for: VD25OH  Clinical ASCVD: No  The ASCVD Risk score (Arnett DK, et al., 2019) failed to calculate for the following reasons:   The valid total cholesterol range is 130 to 320 mg/dL    Depression screen [facility] 2/9 02/15/2021 02/02/2020 01/31/2019  Decreased Interest 0 0 0  Down, Depressed, Hopeless 0 0 0  PHQ - 2 Score 0 0 0  Altered sleeping 0 - -  Tired, decreased energy 0 - -  Change in appetite 0 - -  Feeling bad or failure about yourself  0 - -  Trouble concentrating 1 - -  Moving slowly or fidgety/restless 0 - -  Suicidal thoughts 0 - -  PHQ-9 Score 1 - -  Difficult doing work/chores Not difficult at all - -  Some recent data might be hidden     Social History   Tobacco Use  Smoking Status Never  Smokeless Tobacco Never   BP Readings from Last 3 Encounters:  02/15/21 140/80  07/13/20 138/72  05/19/20 (!) 152/72   Pulse Readings from Last 3 Encounters:  02/15/21 67  07/13/20 (!) 53  05/19/20 70   Wt Readings from Last 3 Encounters:  02/15/21 191 lb (86.6 kg)  07/13/20 185 lb (83.9 kg)  05/19/20 182 lb 9.6 oz (82.8 kg)   BMI Readings from Last 3 Encounters:  02/15/21  28.83 kg/m  07/13/20 27.92 kg/m  05/19/20 27.56 kg/m    Assessment/Interventions: Review of patient past medical history, allergies, medications, health status, including review of consultants reports, laboratory and other test data, was performed as part of comprehensive evaluation and provision of chronic care management services.   SDOH:  (Social Determinants of Health) assessments and interventions performed: Yes  SDOH Screenings   Alcohol Screen: Not on file  Depression (PHQ2-9): Low Risk    PHQ-2 Score: 1  Financial Resource Strain: Not on file  Food Insecurity: Not on file  Housing: Not on file  Physical Activity: Not on file  Social Connections: Not on file  Stress:  Not on file  Tobacco Use: Low Risk    Smoking Tobacco Use: Never   Smokeless Tobacco Use: Never   Passive Exposure: Not on file  Transportation Needs: Not on file    CCM Care Plan  No Known Allergies  Medications Reviewed Today     Reviewed by Binnie Rail, MD (Physician) on 02/14/21 at [place] List Status: <None>   Medication Order Taking? Sig Documenting Provider Last Dose Status Informant  aspirin 81 MG tablet [id number]  Take 1 tablet (81 mg total) by mouth 2 (two) times daily after a meal. Swinteck, Aaron Edelman, MD  Active   carvedilol (COREG) 25 MG tablet [id number]  TAKE 1 TABLET BY MOUTH TWICE DAILY WITH MEALS Burns, Claudina Lick, MD  Active   cholecalciferol (VITAMIN D) 1000 units tablet [id number]  Take 1,000 Units by mouth daily. [provider]  Active Self  docusate sodium (COLACE) 100 MG capsule [id number]  Take 1 capsule (100 mg total) by mouth 2 (two) times daily. Swinteck, Aaron Edelman, MD  Active Self  Flaxseed, Linseed, (FLAX SEED OIL) 1000 MG CAPS [id number]  Take 1,000 mg by mouth daily. [provider]  Active Self  glipiZIDE (GLUCOTROL) 5 MG tablet [id number]  Take 1 tablet (5 mg total) by mouth 2 (two) times daily before a meal. Burns, Claudina Lick, MD  Active   glucose blood (ONE  TOUCH ULTRA TEST) test strip [id number]  Use to check blood sugars once a day and prn. Dx e11.9 Binnie Rail, MD  Active   Lancets [facility] ULTRASOFT) lancets [id number]  Use as instructed to check sugar daily Burns, Claudina Lick, MD  Active   latanoprost (XALATAN) 0.005 % ophthalmic solution [id number]  Place 1 drop into both eyes at bedtime. [provider]  Active   lisinopril (ZESTRIL) 20 MG tablet [id number]  TAKE 1 TABLET(20 MG) BY MOUTH DAILY Burns, Claudina Lick, MD  Active   metFORMIN (GLUCOPHAGE) 500 MG tablet [id number]  TAKE 2 TABLETS BY MOUTH WITH BREAKFAST, 1 TABLET WITH LUNCH AND 2 TABLETS WITH DINNER Burns, Claudina Lick, MD  Active   Multiple Vitamins-Iron (MULTIVITAMINS WITH IRON) TABS tablet [id number]  Take 1 tablet by mouth daily. Susa Day, MD  Active   promethazine (PHENERGAN) 12.5 MG tablet [id number]  Take 1 tablet (12.5 mg total) by mouth every 8 (eight) hours as needed for nausea or vomiting. Ladene Artist, MD  Active   rosuvastatin (CRESTOR) 20 MG tablet [id number]  TAKE 1 TABLET(20 MG) BY MOUTH DAILY Burns, Claudina Lick, MD  Active   Saw Palmetto, Serenoa repens, (SAW PALMETTO PO) [id number]  Take 1 tablet by mouth every morning. [provider]  Active Self  vitamin B-12 (CYANOCOBALAMIN) 1000 MCG tablet [id number]  Take 1,000 mcg by mouth daily. 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   Left leg pain 06/16/2015   Trochanteric bursitis of left hip 04/23/2014   OSA (obstructive sleep apnea) 04/20/2014   Low serum vitamin B12 02/02/2014   Bladder cancer ([place]) 11/21/2013   Melanoma of skin,  site unspecified  10/07/2013   Closed left hip fracture ([place]) 06/18/2012   Small bowel obstruction s/p open lysis of adhesions Jun2013 08/23/2011   Diabetes ([place]) 10/02/2008   History of gout 03/27/2008   Mixed hyperlipidemia 01/11/2007   Glaucoma (increased eye pressure) 01/11/2007   Essential hypertension 01/11/2007    Immunization History  Administered Date(s) Administered   PFIZER(Purple Top)SARS-COV-2 Vaccination 05/15/2019, 06/04/2019, 12/27/2019   Pneumococcal Conjugate-13 07/11/2016   Pneumococcal Polysaccharide-23 07/27/2017   Td 02/05/2002   Zoster, Live 12/10/2015    Conditions to be addressed/monitored:  Hypertension, Hyperlipidemia, Diabetes, and Nausea/ Stomach Pains   There are no care plans that you recently modified to display for this patient.     Medication Assistance: Application for Trulicity  medication assistance program. in process.  Anticipated assistance start date 03/26/2021.  See plan of care for additional detail.  Care Gaps: Foot Exam, Ophthalmology Exam - Tdap, COVID booster, Shingles vaccine   Patient's preferred pharmacy is:  [facility], [place] [address] Phone: [phone] Fax: [facility], [place] [address] [place] [address] Phone: [phone] Fax: [phone]   Uses pill box? Yes Pt endorses 100% compliance  Care Plan and Follow Up Patient Decision:  Patient agrees to Care Plan and Follow-up.  Plan: Telephone follow up appointment with care management team member scheduled for:  1 month The patient has been provided with contact information for the care management team and has been advised to call with any health related questions or concerns.   Tomasa Blase, PharmD Clinical Pharmacist, [place]

## 2021-02-24 ENCOUNTER — Other Ambulatory Visit: Payer: Self-pay

## 2021-02-24 DIAGNOSIS — R1013 Epigastric pain: Secondary | ICD-10-CM

## 2021-02-24 MED ORDER — PANTOPRAZOLE SODIUM 40 MG PO TBEC: 40.0000 mg | DELAYED_RELEASE_TABLET | Freq: Two times a day (BID) | ORAL | 1 refills | Status: DC

## 2021-02-24 NOTE — Telephone Encounter (Signed)
Pt has dropped off tax return information. Placed in pharmacy mailbox.

## 2021-02-25 ENCOUNTER — Telehealth: Payer: Self-pay | Admitting: Internal Medicine

## 2021-02-25 NOTE — Telephone Encounter (Signed)
Type of form received (Home Health, FMLA, disability, handicapped placard, Surgical clearance) Patient Assistance Form  Form placed in (E-fax folder, Provider mailbox)  Provider mailbox  Additional instructions from the patient (mail, fax, notify by phone when complete) Notify patient when complete  Things to remember: [place] office: If form received in person, remind patient that forms take 7-10 business days CMA should attach charge sheet and put on Supervisor's desk

## 2021-02-28 NOTE — Telephone Encounter (Signed)
Patient dropped off patient assistance form.  Faxed today to [facility]

## 2021-03-05 ENCOUNTER — Other Ambulatory Visit: Payer: Self-pay | Admitting: Internal Medicine

## 2021-03-07 ENCOUNTER — Other Ambulatory Visit: Payer: Self-pay | Admitting: Internal Medicine

## 2021-03-07 ENCOUNTER — Telehealth: Payer: Self-pay | Admitting: Internal Medicine

## 2021-03-07 ENCOUNTER — Encounter: Payer: Self-pay | Admitting: Internal Medicine

## 2021-03-07 NOTE — Telephone Encounter (Signed)
Left message for patient to call me back at [phone] to schedule Medicare Annual Wellness Visit   Last AWV  02/04/20  Please schedule at anytime with LB [place]aga if patient calls the office back.    40 Minutes appointment   Any questions, please call me at [phone]

## 2021-03-08 MED ORDER — ROSUVASTATIN CALCIUM 20 MG PO TABS: ORAL_TABLET | ORAL | 2 refills | Status: DC

## 2021-03-15 ENCOUNTER — Encounter: Payer: Self-pay | Admitting: Internal Medicine

## 2021-03-24 ENCOUNTER — Ambulatory Visit (INDEPENDENT_AMBULATORY_CARE_PROVIDER_SITE_OTHER): Payer: Medicare Other

## 2021-03-24 DIAGNOSIS — E1142 Type 2 diabetes mellitus with diabetic polyneuropathy: Secondary | ICD-10-CM

## 2021-03-24 DIAGNOSIS — E782 Mixed hyperlipidemia: Secondary | ICD-10-CM

## 2021-03-24 NOTE — Progress Notes (Signed)
-  Chronic Care Management Pharmacy Note  03/24/2021 Name:  Donald Carlson MRN:  [id number] DOB:  1945-07-21  Summary: -Patient reports that since last visit he has purchased a new blood sugar monitor as his last one was >77 years old  -He reports he has also made changes to diet to help lower BG, reducing carbs and sweets - reports that BG has been averaging 98-108 as of late, the highest he could recall was 158 -reports that he continues to monitor BP periodically - typically has been averaging 140's/80s  Recommendations/Changes made from today's visit: -Recommending for patient to trial jardiance 3m daily due to CKD and BP lowering benefits from medication, previously discussed trulicity start - as BG has improved I do not think GLP1 is necessary for DM control -Reviewed plan with patient, agreeable, will plan for PAP with jardiance   Plan: -F/u in 2 months   Subjective: Donald FRYEis an 76y.o. year old male who is a primary patient of Burns, SClaudina Lick MD.  The CCM team was consulted for assistance with disease management and care coordination needs.    Engaged with patient by telephone for follow up visit in response to provider referral for pharmacy case management and/or care coordination services.   Consent to Services:  The patient was given the following information about Chronic Care Management services today, agreed to services, and gave verbal consent: 1. CCM service includes personalized support from designated clinical staff supervised by the primary care provider, including individualized plan of care and coordination with other care providers 2. 24/7 contact phone numbers for assistance for urgent and routine care needs. 3. Service will only be billed when office clinical staff spend 20 minutes or more in a month to coordinate care. 4. Only one practitioner may furnish and bill the service in a calendar month. 5.The patient may stop CCM services at any time  (effective at the end of the month) by phone call to the office staff. 6. The patient will be responsible for cost sharing (co-pay) of up to 20% of the service fee (after annual deductible is met). Patient agreed to services and consent obtained.  Patient Care Team: BBinnie Rail MD as PCP - General (Internal Medicine) SRod Can MD as Consulting Physician (Orthopedic Surgery) GSharyne Peach MD as Consulting Physician (Ophthalmology)  Recent office visits: 02/15/2021 - Dr. BQuay Burow- not compliant with low sugar diet - BG commonly 400-500's - limited exercise due to chronic back pain - higher does of metformin cause nausea   Recent consult visits: 07/13/2020 - Dr. SDerrill Memo-Gertie Fey- evaluation of epigastric pain - pantoprazole d/cd   Hospital visits: None in previous 6 months  Objective:  Lab Results  Component Value Date   CREATININE 1.50 02/15/2021   BUN 20 02/15/2021   GFR 45.08 (L) 02/15/2021   GFRNONAA 58 (L) 12/01/2019   GFRAA 67 12/01/2019   NA 141 02/15/2021   K 4.7 02/15/2021   CALCIUM 9.5 02/15/2021   CO2 28 02/15/2021   GLUCOSE 183 (H) 02/15/2021    Lab Results  Component Value Date/Time   HGBA1C 9.5 (H) 02/15/2021 08:58 AM   HGBA1C 7.1 (H) 02/02/2020 10:03 AM   GFR 45.08 (L) 02/15/2021 08:58 AM   GFR 55.34 (L) 05/19/2020 11:25 AM   MICROALBUR 18.1 (H) 02/15/2021 08:58 AM   MICROALBUR 0.7 04/07/2015 03:42 PM    Last diabetic Eye exam:  Lab Results  Component Value Date/Time   HMDIABEYEEXA No Retinopathy  02/19/2020 12:00 AM   HMDIABEYEEXA No Retinopathy 02/19/2020 12:00 AM    Last diabetic Foot exam:  No results found for: HMDIABFOOTEX   Lab Results  Component Value Date   CHOL 102 02/15/2021   HDL 32.60 (L) 02/15/2021   LDLCALC 41 02/15/2021   LDLDIRECT 116.7 02/03/2014   TRIG 142.0 02/15/2021   CHOLHDL 3 02/15/2021    Hepatic Function Latest Ref Rng & Units 02/15/2021 05/19/2020 05/06/2020  Total Protein 6.0 - 8.3 g/dL 7.0 6.7 7.0  Albumin 3.5  - 5.2 g/dL 4.2 3.9 4.1  AST 0 - 37 U/L '20 24 19  ' ALT 0 - 53 U/L '18 25 16  ' Alk Phosphatase 39 - 117 U/L 64 46 46  Total Bilirubin 0.2 - 1.2 mg/dL 0.5 0.4 0.4  Bilirubin, Direct 0.0 - 0.3 mg/dL - - -    Lab Results  Component Value Date/Time   TSH 0.78 05/06/2020 02:20 PM   TSH 2.13 01/31/2019 09:21 AM    CBC Latest Ref Rng & Units 02/15/2021 05/06/2020 01/31/2019  WBC 4.0 - 10.5 K/uL 7.9 8.3 6.8  Hemoglobin 13.0 - 17.0 g/dL 11.7(L) 11.0(L) 12.7(L)  Hematocrit 39.0 - 52.0 % 35.2(L) 32.8(L) 37.6(L)  Platelets 150.0 - 400.0 K/uL 182.0 197.0 193.0    No results found for: VD25OH  Clinical ASCVD: No  The ASCVD Risk score (Arnett DK, et al., 2019) failed to calculate for the following reasons:   The valid total cholesterol range is 130 to 320 mg/dL    Depression screen [facility] 2/9 02/15/2021 02/02/2020 01/31/2019  Decreased Interest 0 0 0  Down, Depressed, Hopeless 0 0 0  PHQ - 2 Score 0 0 0  Altered sleeping 0 - -  Tired, decreased energy 0 - -  Change in appetite 0 - -  Feeling bad or failure about yourself  0 - -  Trouble concentrating 1 - -  Moving slowly or fidgety/restless 0 - -  Suicidal thoughts 0 - -  PHQ-9 Score 1 - -  Difficult doing work/chores Not difficult at all - -  Some recent data might be hidden     Social History   Tobacco Use  Smoking Status Never  Smokeless Tobacco Never   BP Readings from Last 3 Encounters:  02/15/21 140/80  07/13/20 138/72  05/19/20 (!) 152/72   Pulse Readings from Last 3 Encounters:  02/15/21 67  07/13/20 (!) 53  05/19/20 70   Wt Readings from Last 3 Encounters:  02/15/21 191 lb (86.6 kg)  07/13/20 185 lb (83.9 kg)  05/19/20 182 lb 9.6 oz (82.8 kg)   BMI Readings from Last 3 Encounters:  02/15/21 28.83 kg/m  07/13/20 27.92 kg/m  05/19/20 27.56 kg/m    Assessment/Interventions: Review of patient past medical history, allergies, medications, health status, including review of consultants reports, laboratory and other  test data, was performed as part of comprehensive evaluation and provision of chronic care management services.   SDOH:  (Social Determinants of Health) assessments and interventions performed: Yes  SDOH Screenings   Alcohol Screen: Not on file  Depression (PHQ2-9): Low Risk    PHQ-2 Score: 1  Financial Resource Strain: Not on file  Food Insecurity: Not on file  Housing: Not on file  Physical Activity: Not on file  Social Connections: Not on file  Stress: Not on file  Tobacco Use: Low Risk    Smoking Tobacco Use: Never   Smokeless Tobacco Use: Never   Passive Exposure: Not on file  Transportation Needs: Not  on file    [place]  No Known Allergies  Medications Reviewed Today     Reviewed by Tomasa Blase, [facility] (Pharmacist) on 03/24/21 at 1510  Med List Status: <None>   Medication Order Taking? Sig Documenting Provider Last Dose Status Informant  aspirin 81 MG tablet [id number] Yes Take 1 tablet (81 mg total) by mouth 2 (two) times daily after a meal.  Patient taking differently: Take 81 mg by mouth daily.   Swinteck, Aaron Edelman, MD Taking Active   carvedilol (COREG) 25 MG tablet [id number] Yes TAKE 1 TABLET BY MOUTH TWICE DAILY WITH MEALS Burns, Claudina Lick, MD Taking Active   cholecalciferol (VITAMIN D) 1000 units tablet [id number] Yes Take 1,000 Units by mouth daily. [provider] Taking Active Self  docusate sodium (COLACE) 100 MG capsule [id number] Yes Take 1 capsule (100 mg total) by mouth 2 (two) times daily.  Patient taking differently: Take 100 mg by mouth every other day.   Swinteck, Aaron Edelman, MD Taking Active Self  Flaxseed, Linseed, (FLAX SEED OIL) 1000 MG CAPS [id number] Yes Take 1,000 mg by mouth daily. [provider] Taking Active Self  glipiZIDE (GLUCOTROL) 5 MG tablet [id number] Yes Take 1 tablet (5 mg total) by mouth 2 (two) times daily before a meal. Burns, Claudina Lick, MD Taking Active   glucose blood (ONE TOUCH ULTRA TEST) test strip [id number] Yes Use  to check blood sugars once a day and prn. Dx e11.9 Binnie Rail, MD Taking Active   Lancets [facility] ULTRASOFT) lancets [id number] Yes Use as instructed to check sugar daily Burns, Claudina Lick, MD Taking Active   latanoprost (XALATAN) 0.005 % ophthalmic solution [id number] Yes Place 1 drop into both eyes at bedtime. [provider] Taking Active   lisinopril (ZESTRIL) 20 MG tablet [id number] Yes TAKE 1 TABLET(20 MG) BY MOUTH DAILY Burns, Claudina Lick, MD Taking Active   metFORMIN (GLUCOPHAGE) 500 MG tablet [id number] Yes Take 1 tablet (500 mg total) by mouth 2 (two) times daily with a meal. Burns, Claudina Lick, MD Taking Active   Multiple Vitamins-Iron (MULTIVITAMINS WITH IRON) TABS tablet [id number] Yes Take 1 tablet by mouth daily. Susa Day, MD Taking Active   pantoprazole (PROTONIX) 40 MG tablet [id number] Yes Take 1 tablet (40 mg total) by mouth 2 (two) times daily. Binnie Rail, MD Taking Active   promethazine (PHENERGAN) 12.5 MG tablet [id number] Yes Take 1 tablet (12.5 mg total) by mouth every 8 (eight) hours as needed for nausea or vomiting. Binnie Rail, MD Taking Active   rosuvastatin (CRESTOR) 20 MG tablet [id number] Yes TAKE 1 TABLET(20 MG) BY MOUTH DAILY Burns, Claudina Lick, MD Taking Active   Saw Palmetto, Serenoa repens, (SAW PALMETTO PO) [id number] Yes Take 1 tablet by mouth every morning. [provider] Taking Active Self  vitamin B-12 (CYANOCOBALAMIN) 1000 MCG tablet [id number] Yes Take 1,000 mcg by mouth daily. 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   Left leg pain 06/16/2015    Trochanteric bursitis of left hip 04/23/2014   OSA (obstructive sleep apnea) 04/20/2014  Low serum vitamin B12 02/02/2014   Bladder cancer ([place]) 11/21/2013   Melanoma of skin, site unspecified 10/07/2013   Closed left hip fracture ([place]) 06/18/2012   Small bowel obstruction s/p open lysis of adhesions Jun2013 08/23/2011   Diabetes ([place]) 10/02/2008   History of gout 03/27/2008   Mixed hyperlipidemia 01/11/2007   Glaucoma (increased eye pressure) 01/11/2007   Essential hypertension 01/11/2007    Immunization History  Administered Date(s) Administered   PFIZER(Purple Top)SARS-COV-2 Vaccination 05/15/2019, 06/04/2019, 12/27/2019   Pneumococcal Conjugate-13 07/11/2016   Pneumococcal Polysaccharide-23 07/27/2017   Td 02/05/2002   Zoster, Live 12/10/2015    Conditions to be addressed/monitored:  Hypertension, Hyperlipidemia, Diabetes, and Nausea/ Stomach Pains   Care Plan : [place]  Updates made by Tomasa Blase, RPH since 03/24/2021 12:00 AM     Problem: HTN, HLD, DM2, Nausea, Abdominal Pain   Priority: High  Onset Date: 02/23/2021     Long-Range Goal: Disease Management   Start Date: 02/23/2021  Expected End Date: 02/23/2022  This Visit's Progress: On track  Recent Progress: On track  Priority: High  Note:   Current Barriers:  Unable to independently afford treatment regimen Unable to achieve control of blood sugars    Pharmacist Clinical Goal(s):  Patient will achieve improvement in blood sugars as evidenced by blood sugar log / next A1c through collaboration with PharmD and provider.   Interventions: 1:1 collaboration with Binnie Rail, MD regarding development and update of comprehensive plan of care as evidenced by provider attestation and co-signature Inter-disciplinary care team collaboration (see longitudinal plan of care) Comprehensive medication review performed; medication list updated in electronic medical record  Hypertension (BP goal  <140/90) -Controlled -Current treatment: Lisinopril 75m - 1 tablet daily  Carvedilol 259m- 1 tablet twice daily  -Medications previously tried: amlodipine, spironolactone,   -Current home readings: reports that BP has been averaging 140/70s BP Readings from Last 3 Encounters:  02/15/21 140/80  07/13/20 138/72  05/19/20 (!) 152/72  -Current dietary habits: reports to a sodium reduced diet, drinks coffee in AM -Current exercise habits: nothing at this time, chronic back pain prevents him from exercising  -Denies hypotensive/hypertensive symptoms -Educated on BP goals and benefits of medications for prevention of heart attack, stroke and kidney damage; Daily salt intake goal < 2300 mg; Importance of home blood pressure monitoring; Proper BP monitoring technique; Symptoms of hypotension and importance of maintaining adequate hydration; -Counseled to monitor BP at home at least once weekly, document, and provide log at future appointments -Counseled on diet and exercise extensively Recommended to continue current medication  Hyperlipidemia: (LDL goal < 70) -Controlled Lab Results  Component Value Date   LDLCALC 41 02/15/2021  -Current treatment: Rosuvastatin 203m 1 tablet daily ASA 45m66mily  -Medications previously tried: pravastatin  -Current dietary patterns: reports to eating fast food for dinner, hot dogs throughout the week often as well -Current exercise habits: nothing at this time, chronic back pain prevents him from exercising  -Educated on Cholesterol goals;  Benefits of statin for ASCVD risk reduction; Importance of limiting foods high in cholesterol; -Counseled on diet and exercise extensively Recommended to continue current medication  Diabetes (A1c goal <7%) -Uncontrolled - improved from most recent appointment due to changes in diet and new BG meter  Lab Results  Component Value Date   HGBA1C 9.5 (H) 02/15/2021  -Current medications: Metformin 500mg6m  tablet BID Glipizide 5mg -30mtablet BID  -Medications previously tried: glimepiride, januvia,  actos, rybelsus   -Current home glucose readings fasting glucose: 98-108 post prandial glucose: 158 was highest he could recall  -Denies hypoglycemic/hyperglycemic symptoms -Current meal patterns:  breakfast: boiled egg, sandwich, chipped beef and gravy, coffee with cream and sugar sweetener, orange juice   lunch: half a chicken salad sandwich and crackers / hot dog   dinner: eating fast food often - salad on occasion, BBQ,  snacks: peanutbutter crackers, canned fruit, chips  drinks: coffee and water, reports that before last PCP appointment was drinking sodas  -Current exercise: nothing at this time, chronic back pain prevents him from exercising  -Educated on A1c and blood sugar goals; Complications of diabetes including kidney damage, retinal damage, and cardiovascular disease; Exercise goal of 150 minutes per week; Benefits of weight loss; Prevention and management of hypoglycemic episodes; Benefits of routine self-monitoring of blood sugar; Carbohydrate counting and/or plate method -Counseled to check feet daily and get yearly eye exams -Recommended for patient to hold on starting trulicity as BG has improved with new meter / dietary changes -Due to CKD and mild elevation in BP - recommending for patient to start Jardiance 30m daily - once started would be able to replace glipizde - patient agreeable to plan, PAP application mailed for patient portion completion   Nausea/ Gastric Pain (Goal: Prevention of nausea / control of gastric pain) -Controlled -Current treatment  Pantoprazole 487m- 1 tablet twice daily   Promethazine 12.55m52m 1 tablet every 8 hours as needed  -Medications previously tried: omeprazole, ondansetron   -Recommended to continue current medication  Health Maintenance -Vaccine gaps: Shingles, COVID, Tdap booster -Current therapy:  Docusate 100m69m1 capsule every  other day  Vitamin D 1000 units daily  Flax See Oil 1000mg40mly  Saw Palmetto - 1 tablet daily  Vitamin B12 - 1000mcg76mly Multivitamin with Iron - 1 tablet daily  -Educated on Cost vs benefit of each product must be carefully weighed by individual consumer -Patient is satisfied with current therapy and denies issues -Recommended to continue current medication  Patient Goals/Self-Care Activities Patient will:  - take medications as prescribed as evidenced by patient report and record review focus on medication adherence by taking all medications as directed check glucose once daily, document, and provide at future appointments check blood pressure once weekly, document, and provide at future appointments collaborate with provider on medication access solutions engage in dietary modifications by reducing carbohydrate intake to help better control BG  Follow Up Plan: Telephone follow up appointment with care management team member scheduled for: 2 months The patient has been provided with contact information for the care management team and has been advised to call with any health related questions or concerns.       Medication Assistance: Application for Trulicity  medication assistance program. in process.  Anticipated assistance start date 03/26/2021.  See plan of care for additional detail.  Care Gaps: Foot Exam, Ophthalmology Exam - Tdap, COVID booster, Shingles vaccine   Patient's preferred pharmacy is:  [facility]: [phone]: [phone] pill box? Yes Pt endorses 100% compliance  Care Plan and Follow Up Patient Decision:  Patient agrees to Care Plan and  Follow-up.  Plan: Telephone follow up appointment with care management team member scheduled for:  1 month The patient has been provided with contact information for the care management team and has been advised to call with any health related questions or concerns.   Tomasa Blase, PharmD Clinical Pharmacist, [place]

## 2021-03-24 NOTE — Patient Instructions (Signed)
Visit Information  Following are the goals we discussed today:   Manage My Medicine   Timeframe:  Long-Range Goal Priority:  Medium Start Date:   03/24/2021                          Expected End Date:  03/24/2022                     Follow Up Date 05/22/2021   - call for medicine refill 2 or 3 days before it runs out - call if I am sick and can't take my medicine - keep a list of all the medicines I take; vitamins and herbals too - learn to read medicine labels    Why is this important?   These steps will help you keep on track with your medicines.  Plan: Telephone follow up appointment with care management team member scheduled for:  2 months  The patient has been provided with contact information for the care management team and has been advised to call with any health related questions or concerns.   Tomasa Blase, PharmD Clinical Pharmacist, [place]   Please call the care guide team at [phone] if you need to cancel or reschedule your appointment.   Patient verbalizes understanding of instructions provided today and agrees to view in [place].

## 2021-04-03 ENCOUNTER — Other Ambulatory Visit: Payer: Self-pay | Admitting: Internal Medicine

## 2021-04-05 ENCOUNTER — Other Ambulatory Visit: Payer: Self-pay | Admitting: Internal Medicine

## 2021-04-08 ENCOUNTER — Other Ambulatory Visit: Payer: Self-pay | Admitting: Internal Medicine

## 2021-04-19 DIAGNOSIS — E782 Mixed hyperlipidemia: Secondary | ICD-10-CM | POA: Diagnosis not present

## 2021-04-19 DIAGNOSIS — E1142 Type 2 diabetes mellitus with diabetic polyneuropathy: Secondary | ICD-10-CM | POA: Diagnosis not present

## 2021-04-22 ENCOUNTER — Telehealth: Payer: Self-pay

## 2021-04-22 NOTE — Progress Notes (Signed)
° ° °  Chronic Care Management Pharmacy Assistant   Name: Donald Carlson  MRN: [id number] DOB: 1945-06-14   Reason for Encounter: Disease State-General     Recent office visits:  None ID  Recent consult visits:  None ID  Hospital visits:  None in previous 6 months  Medications: Outpatient Encounter Medications as of 04/22/2021  Medication Sig   aspirin 81 MG tablet Take 1 tablet (81 mg total) by mouth 2 (two) times daily after a meal. (Patient taking differently: Take 81 mg by mouth daily.)   carvedilol (COREG) 25 MG tablet TAKE 1 TABLET BY MOUTH TWICE DAILY WITH MEALS   cholecalciferol (VITAMIN D) 1000 units tablet Take 1,000 Units by mouth daily.   docusate sodium (COLACE) 100 MG capsule Take 1 capsule (100 mg total) by mouth 2 (two) times daily. (Patient taking differently: Take 100 mg by mouth every other day.)   Flaxseed, Linseed, (FLAX SEED OIL) 1000 MG CAPS Take 1,000 mg by mouth daily.   glipiZIDE (GLUCOTROL) 5 MG tablet Take 1 tablet (5 mg total) by mouth 2 (two) times daily before a meal.   glucose blood (ONE TOUCH ULTRA TEST) test strip Use to check blood sugars once a day and prn. Dx e11.9   Lancets (ONETOUCH ULTRASOFT) lancets Use as instructed to check sugar daily   latanoprost (XALATAN) 0.005 % ophthalmic solution Place 1 drop into both eyes at bedtime.   lisinopril (ZESTRIL) 20 MG tablet TAKE 1 TABLET(20 MG) BY MOUTH DAILY   metFORMIN (GLUCOPHAGE) 500 MG tablet TAKE 2 TABLETS(1000 MG) BY MOUTH TWICE DAILY   Multiple Vitamins-Iron (MULTIVITAMINS WITH IRON) TABS tablet Take 1 tablet by mouth daily.   pantoprazole (PROTONIX) 40 MG tablet Take 1 tablet (40 mg total) by mouth 2 (two) times daily.   promethazine (PHENERGAN) 12.5 MG tablet Take 1 tablet (12.5 mg total) by mouth every 8 (eight) hours as needed for nausea or vomiting.   rosuvastatin (CRESTOR) 20 MG tablet TAKE 1 TABLET(20 MG) BY MOUTH DAILY   Saw Palmetto, Serenoa repens, (SAW PALMETTO PO) Take 1 tablet by  mouth every morning.   vitamin B-12 (CYANOCOBALAMIN) 1000 MCG tablet Take 1,000 mcg by mouth daily.   No facility-administered encounter medications on file as of 04/22/2021.   Have you had any problems recently with your health?Patient states that he has been doing well. He states that his blood sugars have been good since he has been using new meter  Have you had any problems with your pharmacy?Patient states that he is not having any problems with getting medications or the cost of medications from the pharmacy  What issues or side effects are you having with your medications? Patient states that he is not having any side effects from medications   What would you like me to pass along to [facility] for them to help you with? Patient states he is doing well  What can we do to take care of you better? Patient states that he does not need anything at this time  Care Gaps: Colonoscopy-NA Diabetic Foot Exam-07/27/17 Ophthalmology-02/19/20 Dexa Scan - NA Annual Well Visit - NA Micro albumin-NA Hemoglobin A1c- 02/15/21  Star Rating Drugs: Rosuvastatin 20 mg-last fill 04/06/21 Lisinopril 20 mg-last fill 02/09/21  Ethelene Hal Clinical Pharmacist Assistant [phone]

## 2021-05-09 ENCOUNTER — Other Ambulatory Visit: Payer: Self-pay | Admitting: Internal Medicine

## 2021-05-17 NOTE — Patient Instructions (Addendum)
? ? ? ?  Blood work was ordered.   ? ? ?Medications changes include :   none ? ? ?Your prescription(s) have been sent to your pharmacy.  ? ? ? ?Return in about 6 months (around 11/18/2021) for follow up. ? ?

## 2021-05-17 NOTE — Progress Notes (Signed)
? ? ? ? ?Subjective:  ? ? Patient ID: Donald Carlson, male    DOB: 09-21-1945, 76 y.o.   MRN: [id number] ? ?This visit occurred during the SARS-CoV-2 public health emergency.  Safety protocols were in place, including screening questions prior to the visit, additional usage of staff PPE, and extensive cleaning of exam room while observing appropriate contact time as indicated for disinfecting solutions.   ? ? ?HPI ?Lincoln is here for follow up of his chronic medical problems, including DM, HTN, HLD, chronic back pain ? ?He is not able to exercise due to chronic back pain. ? ?His sugars have been pretty good at home.   ? ?Medications and allergies reviewed with patient and updated if appropriate. ? ?Current Outpatient Medications on File Prior to Visit  ?Medication Sig Dispense Refill  ? aspirin 81 MG tablet Take 1 tablet (81 mg total) by mouth 2 (two) times daily after a meal. (Patient taking differently: Take 81 mg by mouth daily.) 60 tablet 1  ? carvedilol (COREG) 25 MG tablet TAKE 1 TABLET BY MOUTH TWICE DAILY WITH MEALS 180 tablet 0  ? cholecalciferol (VITAMIN D) 1000 units tablet Take 1,000 Units by mouth daily.    ? docusate sodium (COLACE) 100 MG capsule Take 1 capsule (100 mg total) by mouth 2 (two) times daily. (Patient taking differently: Take 100 mg by mouth every other day.) 60 capsule 3  ? Flaxseed, Linseed, (FLAX SEED OIL) 1000 MG CAPS Take 1,000 mg by mouth daily.    ? glipiZIDE (GLUCOTROL) 5 MG tablet Take 1 tablet (5 mg total) by mouth 2 (two) times daily before a meal. 60 tablet 5  ? glucose blood (ONE TOUCH ULTRA TEST) test strip Use to check blood sugars once a day and prn. Dx e11.9 100 each 1  ? Lancets (ONETOUCH ULTRASOFT) lancets Use as instructed to check sugar daily 100 each 12  ? latanoprost (XALATAN) 0.005 % ophthalmic solution Place 1 drop into both eyes at bedtime.    ? lisinopril (ZESTRIL) 20 MG tablet TAKE 1 TABLET(20 MG) BY MOUTH DAILY 90 tablet 3  ? Multiple Vitamins-Iron  (MULTIVITAMINS WITH IRON) TABS tablet Take 1 tablet by mouth daily. 30 tablet 0  ? pantoprazole (PROTONIX) 40 MG tablet Take 1 tablet (40 mg total) by mouth 2 (two) times daily. 90 tablet 1  ? promethazine (PHENERGAN) 12.5 MG tablet Take 1 tablet (12.5 mg total) by mouth every 8 (eight) hours as needed for nausea or vomiting. 60 tablet 5  ? rosuvastatin (CRESTOR) 20 MG tablet TAKE 1 TABLET(20 MG) BY MOUTH DAILY 90 tablet 1  ? Saw Palmetto, Serenoa repens, (SAW PALMETTO PO) Take 1 tablet by mouth every morning.    ? vitamin B-12 (CYANOCOBALAMIN) 1000 MCG tablet Take 1,000 mcg by mouth daily.    ? ?No current facility-administered medications on file prior to visit.  ? ? ? ?Review of Systems  ?Constitutional:  Negative for chills and fever.  ?Respiratory:  Negative for cough, shortness of breath and wheezing.   ?Cardiovascular:  Positive for leg swelling (mild). Negative for chest pain and palpitations.  ?Gastrointestinal:  Negative for abdominal pain.  ?Neurological:  Positive for headaches (occ). Negative for light-headedness.  ? ?   ?Objective:  ? ?Vitals:  ? 05/18/21 0909  ?BP: (!) 142/72  ?Pulse: 65  ?Temp: 98 ?F (36.7 ?C)  ?SpO2: 99%  ? ?BP Readings from Last 3 Encounters:  ?05/18/21 (!) 142/72  ?02/15/21 140/80  ?07/13/20 138/72  ? ?Wt  Readings from Last 3 Encounters:  ?05/18/21 186 lb 3.2 oz (84.5 kg)  ?02/15/21 191 lb (86.6 kg)  ?07/13/20 185 lb (83.9 kg)  ? ?Body mass index is 28.1 kg/m?. ? ?  ?Physical Exam ?Constitutional:   ?   General: He is not in acute distress. ?   Appearance: Normal appearance. He is not ill-appearing.  ?HENT:  ?   Head: Normocephalic and atraumatic.  ?Eyes:  ?   Conjunctiva/sclera: Conjunctivae normal.  ?Cardiovascular:  ?   Rate and Rhythm: Normal rate and regular rhythm.  ?   Heart sounds: Normal heart sounds. No murmur heard. ?Pulmonary:  ?   Effort: Pulmonary effort is normal. No respiratory distress.  ?   Breath sounds: Normal breath sounds. No wheezing or rales.   ?Musculoskeletal:  ?   Right lower leg: No edema.  ?   Left lower leg: No edema.  ?Skin: ?   General: Skin is warm and dry.  ?   Findings: No rash.  ?Neurological:  ?   Mental Status: He is alert. Mental status is at baseline.  ?Psychiatric:     ?   Mood and Affect: Mood normal.  ? ?   ? ?Lab Results  ?Component Value Date  ? WBC 7.9 02/15/2021  ? HGB 11.7 (L) 02/15/2021  ? HCT 35.2 (L) 02/15/2021  ? PLT 182.0 02/15/2021  ? GLUCOSE 183 (H) 02/15/2021  ? CHOL 102 02/15/2021  ? TRIG 142.0 02/15/2021  ? HDL 32.60 (L) 02/15/2021  ? LDLDIRECT 116.7 02/03/2014  ? [place] 41 02/15/2021  ? ALT 18 02/15/2021  ? AST 20 02/15/2021  ? NA 141 02/15/2021  ? K 4.7 02/15/2021  ? CL 107 02/15/2021  ? CREATININE 1.50 02/15/2021  ? BUN 20 02/15/2021  ? CO2 28 02/15/2021  ? TSH 0.78 05/06/2020  ? PSA 0.37 10/23/2007  ? INR 0.96 01/23/2013  ? HGBA1C 9.5 (H) 02/15/2021  ? MICROALBUR 18.1 (H) 02/15/2021  ? ? ? ?Assessment & Plan:  ? ? ?See Problem List for Assessment and Plan of chronic medical problems.  ? ? ?

## 2021-05-18 ENCOUNTER — Encounter: Payer: Self-pay | Admitting: Internal Medicine

## 2021-05-18 ENCOUNTER — Other Ambulatory Visit: Payer: Self-pay

## 2021-05-18 ENCOUNTER — Ambulatory Visit (INDEPENDENT_AMBULATORY_CARE_PROVIDER_SITE_OTHER): Payer: Medicare Other | Admitting: Internal Medicine

## 2021-05-18 VITALS — BP 142/72 | HR 65 | Temp 98.0°F | Ht 68.25 in | Wt 186.2 lb

## 2021-05-18 DIAGNOSIS — I10 Essential (primary) hypertension: Secondary | ICD-10-CM

## 2021-05-18 DIAGNOSIS — E1142 Type 2 diabetes mellitus with diabetic polyneuropathy: Secondary | ICD-10-CM

## 2021-05-18 DIAGNOSIS — E782 Mixed hyperlipidemia: Secondary | ICD-10-CM | POA: Diagnosis not present

## 2021-05-18 LAB — COMPREHENSIVE METABOLIC PANEL
ALT: 17 U/L (ref 0–53)
AST: 19 U/L (ref 0–37)
Albumin: 4.3 g/dL (ref 3.5–5.2)
Alkaline Phosphatase: 60 U/L (ref 39–117)
BUN: 23 mg/dL (ref 6–23)
CO2: 28 mEq/L (ref 19–32)
Calcium: 9.6 mg/dL (ref 8.4–10.5)
Chloride: 105 mEq/L (ref 96–112)
Creatinine, Ser: 1.49 mg/dL (ref 0.40–1.50)
GFR: 45.36 mL/min — ABNORMAL LOW (ref >60.00)
Glucose, Bld: 100 mg/dL — ABNORMAL HIGH (ref 70–99)
Potassium: 4.5 mEq/L (ref 3.5–5.1)
Sodium: 140 mEq/L (ref 135–145)
Total Bilirubin: 0.4 mg/dL (ref 0.2–1.2)
Total Protein: 7.2 g/dL (ref 6.0–8.3)

## 2021-05-18 LAB — CBC WITH DIFFERENTIAL/PLATELET
Basophils Absolute: 0.1 10*3/uL (ref 0.0–0.1)
Basophils Relative: 0.7 % (ref 0.0–3.0)
Eosinophils Absolute: 0.4 10*3/uL (ref 0.0–0.7)
Eosinophils Relative: 5 % (ref 0.0–5.0)
HCT: 36.7 % — ABNORMAL LOW (ref 39.0–52.0)
Hemoglobin: 12.1 g/dL — ABNORMAL LOW (ref 13.0–17.0)
Lymphocytes Relative: 21.8 % (ref 12.0–46.0)
Lymphs Abs: 1.9 10*3/uL (ref 0.7–4.0)
MCHC: 32.9 g/dL (ref 30.0–36.0)
MCV: 82.6 fl (ref 78.0–100.0)
Monocytes Absolute: 0.8 10*3/uL (ref 0.1–1.0)
Monocytes Relative: 8.7 % (ref 3.0–12.0)
Neutro Abs: 5.7 10*3/uL (ref 1.4–7.7)
Neutrophils Relative %: 63.8 % (ref 43.0–77.0)
Platelets: 191 10*3/uL (ref 150.0–400.0)
RBC: 4.44 Mil/uL (ref 4.22–5.81)
RDW: 14.7 % (ref 11.5–15.5)
WBC: 8.9 10*3/uL (ref 4.0–10.5)

## 2021-05-18 LAB — LIPID PANEL
Cholesterol: 104 mg/dL (ref 0–200)
HDL: 34.1 mg/dL — ABNORMAL LOW (ref >39.00)
LDL Cholesterol: 46 mg/dL (ref 0–99)
NonHDL: 70.04
Total CHOL/HDL Ratio: 3
Triglycerides: 121 mg/dL (ref 0.0–149.0)
VLDL: 24.2 mg/dL (ref 0.0–40.0)

## 2021-05-18 LAB — HEMOGLOBIN A1C: Hgb A1c MFr Bld: 6.5 % (ref 4.6–6.5)

## 2021-05-18 MED ORDER — METFORMIN HCL 500 MG PO TABS: 500.0000 mg | ORAL_TABLET | Freq: Two times a day (BID) | ORAL | 1 refills | Status: DC

## 2021-05-18 NOTE — Assessment & Plan Note (Signed)
Chronic ?Blood pressure well controlled ?CMP ?Continue Coreg 25 mg twice daily, lisinopril 20 mg daily ?

## 2021-05-18 NOTE — Assessment & Plan Note (Addendum)
Chronic ?Sugars have not been well controlled ?He has changed his diet since he was here last ?Stressed diabetic diet.  He is limited in what he can do physically because of chronic back pain ?Check A1c ?Continue metformin 500 mg bid - has not tolerated a higher dose ?Continue glipizide 5 mg twice daily ?

## 2021-05-18 NOTE — Assessment & Plan Note (Signed)
Chronic Regular exercise and healthy diet encouraged Check lipid panel  Continue rosuvastatin 20 mg daily 

## 2021-05-23 ENCOUNTER — Other Ambulatory Visit: Payer: Self-pay | Admitting: Internal Medicine

## 2021-05-23 DIAGNOSIS — R1013 Epigastric pain: Secondary | ICD-10-CM

## 2021-06-16 ENCOUNTER — Telehealth: Payer: Medicare Other

## 2021-07-02 ENCOUNTER — Other Ambulatory Visit: Payer: Self-pay | Admitting: Internal Medicine

## 2021-07-05 ENCOUNTER — Telehealth: Payer: Self-pay

## 2021-07-05 NOTE — Progress Notes (Signed)
? ? ?Chronic Care Management ?Pharmacy Assistant  ? ?Name: Donald Carlson  MRN: [id number] DOB: 1946-03-06 ? ? ?Reason for Encounter: Disease State ?  ?Conditions to be addressed/monitored: ?DMII ? ? ?Recent office visits:  ?05/18/21 Binnie Rail, MD-PCP (Hypertension)   ?Blood work was ordered.  Medications changes include :   none ? ?Recent consult visits:  ?None since the last coordination call ? ?Hospital visits:  ?None since the last coordination call ? ?Medications: ?Outpatient Encounter Medications as of 07/05/2021  ?Medication Sig  ? aspirin 81 MG tablet Take 1 tablet (81 mg total) by mouth 2 (two) times daily after a meal. (Patient taking differently: Take 81 mg by mouth daily.)  ? carvedilol (COREG) 25 MG tablet TAKE 1 TABLET BY MOUTH TWICE DAILY WITH MEALS  ? cholecalciferol (VITAMIN D) 1000 units tablet Take 1,000 Units by mouth daily.  ? docusate sodium (COLACE) 100 MG capsule Take 1 capsule (100 mg total) by mouth 2 (two) times daily. (Patient taking differently: Take 100 mg by mouth every other day.)  ? Flaxseed, Linseed, (FLAX SEED OIL) 1000 MG CAPS Take 1,000 mg by mouth daily.  ? glipiZIDE (GLUCOTROL) 5 MG tablet Take 1 tablet (5 mg total) by mouth 2 (two) times daily before a meal.  ? glucose blood (ONE TOUCH ULTRA TEST) test strip Use to check blood sugars once a day and prn. Dx e11.9  ? Lancets (ONETOUCH ULTRASOFT) lancets Use as instructed to check sugar daily  ? latanoprost (XALATAN) 0.005 % ophthalmic solution Place 1 drop into both eyes at bedtime.  ? lisinopril (ZESTRIL) 20 MG tablet TAKE 1 TABLET(20 MG) BY MOUTH DAILY  ? metFORMIN (GLUCOPHAGE) 500 MG tablet Take 1 tablet (500 mg total) by mouth 2 (two) times daily with a meal.  ? Multiple Vitamins-Iron (MULTIVITAMINS WITH IRON) TABS tablet Take 1 tablet by mouth daily.  ? pantoprazole (PROTONIX) 40 MG tablet TAKE 1 TABLET(40 MG) BY MOUTH TWICE DAILY  ? promethazine (PHENERGAN) 12.5 MG tablet Take 1 tablet (12.5 mg total) by mouth every 8  (eight) hours as needed for nausea or vomiting.  ? rosuvastatin (CRESTOR) 20 MG tablet TAKE 1 TABLET(20 MG) BY MOUTH DAILY  ? Saw Palmetto, Serenoa repens, (SAW PALMETTO PO) Take 1 tablet by mouth every morning.  ? vitamin B-12 (CYANOCOBALAMIN) 1000 MCG tablet Take 1,000 mcg by mouth daily.  ? ?No facility-administered encounter medications on file as of 07/05/2021.  ? ?Recent Relevant Labs: ?Lab Results  ?Component Value Date/Time  ? HGBA1C 6.5 05/18/2021 09:42 AM  ? HGBA1C 9.5 (H) 02/15/2021 08:58 AM  ? MICROALBUR 18.1 (H) 02/15/2021 08:58 AM  ? MICROALBUR 0.7 04/07/2015 03:42 PM  ?  ?Kidney Function ?Lab Results  ?Component Value Date/Time  ? CREATININE 1.49 05/18/2021 09:42 AM  ? CREATININE 1.50 02/15/2021 08:58 AM  ? CREATININE 1.22 (H) 12/01/2019 10:27 AM  ? GFR 45.36 (L) 05/18/2021 09:42 AM  ? GFRNONAA 58 (L) 12/01/2019 10:27 AM  ? GFRAA 67 12/01/2019 10:27 AM  ? ?Reviewed chart for medication changes and drug therapy problems ahead of medication adherence call. ? ?Attempted to contact patient x 3 for medication review and health check, unable to reach patient, left voicemails to return call. ?  ?Current antihyperglycemic regimen:  ?Metformin 500 mg ?Glipizide 5 mg ? ?What recent interventions/DTPs have been made to improve glycemic control:  ?None since last coordination call ? ?Have there been any recent hospitalizations or ED visits since last visit with CPP? No, not since last  coordination call ? ? ?Adherence Review: ?Is the patient currently on a STATIN medication? Yes ?Is the patient currently on ACE/ARB medication? No ?Does the patient have >5 day gap between last estimated fill dates? No ? ?Care Gaps: ?Colonoscopy-NA ?Diabetic Foot Exam-07/27/17 ?Ophthalmology-02/19/20 ?Dexa Scan - NA ?Annual Well Visit - NA ?Micro albumin-NA ?Hemoglobin A1c- 02/15/21 ?  ?Star Rating Drugs: ?Rosuvastatin 20 mg-last fill 04/06/21 ?Lisinopril 20 mg-last fill 02/09/21 ?  ?Ethelene Hal ?Clinical Pharmacist  Assistant ?[phone]  ? ?

## 2021-07-07 ENCOUNTER — Other Ambulatory Visit: Payer: Self-pay | Admitting: Internal Medicine

## 2021-07-07 DIAGNOSIS — E119 Type 2 diabetes mellitus without complications: Secondary | ICD-10-CM | POA: Diagnosis not present

## 2021-07-07 DIAGNOSIS — H401231 Low-tension glaucoma, bilateral, mild stage: Secondary | ICD-10-CM | POA: Diagnosis not present

## 2021-07-07 LAB — HM DIABETES EYE EXAM

## 2021-07-15 ENCOUNTER — Encounter: Payer: Self-pay | Admitting: Internal Medicine

## 2021-07-15 NOTE — Progress Notes (Signed)
Outside notes received. Information abstracted. Notes sent to scan.  

## 2021-07-17 ENCOUNTER — Other Ambulatory Visit: Payer: Self-pay | Admitting: Internal Medicine

## 2021-08-04 ENCOUNTER — Telehealth: Payer: Self-pay

## 2021-08-04 NOTE — Progress Notes (Unsigned)
Chronic Care Management Pharmacy Assistant   Name: Donald Carlson  MRN: [id number] DOB: [birth year]    Reason for Encounter: Disease State-Adherence    Recent office visits:  None since the last coordination call 06/25/21  Recent consult visits:  None since the last coordination call 06/25/21  Hospital visits:  None since the last coordination call 06/25/21  Medications: Outpatient Encounter Medications as of 08/04/2021  Medication Sig   aspirin 81 MG tablet Take 1 tablet (81 mg total) by mouth 2 (two) times daily after a meal. (Patient taking differently: Take 81 mg by mouth daily.)   carvedilol (COREG) 25 MG tablet TAKE 1 TABLET BY MOUTH TWICE DAILY WITH MEALS   cholecalciferol (VITAMIN D) 1000 units tablet Take 1,000 Units by mouth daily.   docusate sodium (COLACE) 100 MG capsule Take 1 capsule (100 mg total) by mouth 2 (two) times daily. (Patient taking differently: Take 100 mg by mouth every other day.)   Flaxseed, Linseed, (FLAX SEED OIL) 1000 MG CAPS Take 1,000 mg by mouth daily.   glipiZIDE (GLUCOTROL) 5 MG tablet TAKE 1 TABLET(5 MG) BY MOUTH TWICE DAILY BEFORE A MEAL   glucose blood (ONE TOUCH ULTRA TEST) test strip Use to check blood sugars once a day and prn. Dx e11.9   Lancets (ONETOUCH ULTRASOFT) lancets Use as instructed to check sugar daily   latanoprost (XALATAN) 0.005 % ophthalmic solution Place 1 drop into both eyes at bedtime.   lisinopril (ZESTRIL) 20 MG tablet TAKE 1 TABLET(20 MG) BY MOUTH DAILY   metFORMIN (GLUCOPHAGE) 500 MG tablet TAKE 2 TABLETS(1000 MG) BY MOUTH TWICE DAILY   Multiple Vitamins-Iron (MULTIVITAMINS WITH IRON) TABS tablet Take 1 tablet by mouth daily.   pantoprazole (PROTONIX) 40 MG tablet TAKE 1 TABLET(40 MG) BY MOUTH TWICE DAILY   promethazine (PHENERGAN) 12.5 MG tablet Take 1 tablet (12.5 mg total) by mouth every 8 (eight) hours as needed for nausea or vomiting.   rosuvastatin (CRESTOR) 20 MG tablet TAKE 1 TABLET(20 MG) BY MOUTH DAILY    Saw Palmetto, Serenoa repens, (SAW PALMETTO PO) Take 1 tablet by mouth every morning.   vitamin B-12 (CYANOCOBALAMIN) 1000 MCG tablet Take 1,000 mcg by mouth daily.   No facility-administered encounter medications on file as of 08/04/2021.    Contacted Leary Roca for General Review Call   Chart Review:  Have there been any documented new, changed, or discontinued medications since last visit? No (If yes, include name, dose, frequency, date) Has there been any documented recent hospitalizations or ED visits since last visit with Clinical Pharmacist? No Brief Summary (including medication and/or Diagnosis changes):   Adherence Review:  Does the Clinical Pharmacist Assistant have access to adherence rates? Yes Adherence rates for STAR metric medications (List medication(s)/day supply/ last 2 fill dates). Adherence rates for medications indicated for disease state being reviewed (List medication(s)/day supply/ last 2 fill dates). Does the patient have >5 day gap between last estimated fill dates for any of the above medications or other medication gaps? No Reason for medication gaps.   Disease State Questions:  Able to connect with Patient? {yes/no:20286}  Did patient have any problems with their health recently? {yes/no:20286} Note problems and Concerns:  Have you had any admissions or emergency room visits or worsening of your condition(s) since last visit? No, not since last coordination call  Have you had any visits with new specialists or providers since your last visit? No, not since last coordination call  Have you had  any new health care problem(s) since your last visit? {yes/no:20286} New problem(s) reported:  Have you run out of any of your medications since you last spoke with clinical pharmacist? {yes/no:20286} What caused you to run out of your medications?  Are there any medications you are not taking as prescribed? {yes/no:20286} What kept you from taking  your medications as prescribed?  Are you having any issues or side effects with your medications? {yes/no:20286} Note of issues or side effects:  Do you have any other health concerns or questions you want to discuss with your Clinical Pharmacist before your next visit? {yes/no:20286} Note additional concerns and questions from Patient.  Are there any health concerns that you feel we can do a better job addressing? {yes/no:20286} Note Patient's response.  Are you having any problems with any of the following since the last visit: (select all that apply)  {General Call:27390}  Details:  12. Any falls since last visit? {yes/no:20286}  Details:  13. Any increased or uncontrolled pain since last visit? {yes/no:20286}  Details:  14. Next visit Type: None       Visit with:        Date:        Time:  19. Additional Details? {yes/no:20286}    Care Gaps: Colonoscopy-NA Diabetic Foot Exam-07/27/17 Ophthalmology-02/19/20 Dexa Scan - NA Annual Well Visit - NA Micro albumin-NA Hemoglobin A1c- 02/15/21   Star Rating Drugs: Rosuvastatin 20 mg-last fill 04/06/21 Lisinopril 20 mg-last fill 02/09/21   Ethelene Hal Clinical Pharmacist Assistant [phone]

## 2021-08-21 ENCOUNTER — Other Ambulatory Visit: Payer: Self-pay | Admitting: Internal Medicine

## 2021-08-21 DIAGNOSIS — R1013 Epigastric pain: Secondary | ICD-10-CM

## 2021-09-30 ENCOUNTER — Other Ambulatory Visit: Payer: Self-pay | Admitting: Internal Medicine

## 2021-11-18 ENCOUNTER — Other Ambulatory Visit: Payer: Self-pay | Admitting: Internal Medicine

## 2021-11-18 ENCOUNTER — Ambulatory Visit: Payer: Medicare Other | Admitting: Internal Medicine

## 2021-11-18 DIAGNOSIS — R1013 Epigastric pain: Secondary | ICD-10-CM

## 2021-11-22 ENCOUNTER — Encounter: Payer: Self-pay | Admitting: Internal Medicine

## 2021-11-22 DIAGNOSIS — N183 Chronic kidney disease, stage 3 unspecified: Secondary | ICD-10-CM | POA: Insufficient documentation

## 2021-11-22 DIAGNOSIS — N1832 Chronic kidney disease, stage 3b: Secondary | ICD-10-CM | POA: Insufficient documentation

## 2021-11-22 NOTE — Patient Instructions (Signed)
     Blood work was ordered.     Medications changes include start hydralazine 10 mg twice daily.  Continue your other medications.       Your prescription(s) have been sent to your pharmacy.      Return in about 6 months (around 05/24/2022) for follow up.

## 2021-11-22 NOTE — Progress Notes (Unsigned)
Subjective:    Patient ID: Donald Carlson, male    DOB: 02/27/1946, 76 y.o.   MRN: [id number]     HPI Donald Carlson is here for follow up of his chronic medical problems, including htn, DM, hld, ckd  Discuss ckd  Medications and allergies reviewed with patient and updated if appropriate.  Current Outpatient Medications on File Prior to Visit  Medication Sig Dispense Refill   aspirin 81 MG tablet Take 1 tablet (81 mg total) by mouth 2 (two) times daily after a meal. (Patient taking differently: Take 81 mg by mouth daily.) 60 tablet 1   carvedilol (COREG) 25 MG tablet TAKE 1 TABLET BY MOUTH TWICE DAILY WITH MEALS 180 tablet 0   cholecalciferol (VITAMIN D) 1000 units tablet Take 1,000 Units by mouth daily.     docusate sodium (COLACE) 100 MG capsule Take 1 capsule (100 mg total) by mouth 2 (two) times daily. (Patient taking differently: Take 100 mg by mouth every other day.) 60 capsule 3   Flaxseed, Linseed, (FLAX SEED OIL) 1000 MG CAPS Take 1,000 mg by mouth daily.     glipiZIDE (GLUCOTROL) 5 MG tablet TAKE 1 TABLET(5 MG) BY MOUTH TWICE DAILY BEFORE A MEAL 60 tablet 5   glucose blood (ONE TOUCH ULTRA TEST) test strip Use to check blood sugars once a day and prn. Dx e11.9 100 each 1   Lancets (ONETOUCH ULTRASOFT) lancets Use as instructed to check sugar daily 100 each 12   latanoprost (XALATAN) 0.005 % ophthalmic solution Place 1 drop into both eyes at bedtime.     lisinopril (ZESTRIL) 20 MG tablet TAKE 1 TABLET(20 MG) BY MOUTH DAILY 90 tablet 3   metFORMIN (GLUCOPHAGE) 500 MG tablet TAKE 2 TABLETS(1000 MG) BY MOUTH TWICE DAILY 180 tablet 1   Multiple Vitamins-Iron (MULTIVITAMINS WITH IRON) TABS tablet Take 1 tablet by mouth daily. 30 tablet 0   pantoprazole (PROTONIX) 40 MG tablet TAKE 1 TABLET(40 MG) BY MOUTH TWICE DAILY 90 tablet 1   promethazine (PHENERGAN) 12.5 MG tablet Take 1 tablet (12.5 mg total) by mouth every 8 (eight) hours as needed for nausea or vomiting. 60 tablet 5    rosuvastatin (CRESTOR) 20 MG tablet TAKE 1 TABLET(20 MG) BY MOUTH DAILY 90 tablet 1   Saw Palmetto, Serenoa repens, (SAW PALMETTO PO) Take 1 tablet by mouth every morning.     vitamin B-12 (CYANOCOBALAMIN) 1000 MCG tablet Take 1,000 mcg by mouth daily.     No current facility-administered medications on file prior to visit.     Review of Systems     Objective:  There were no vitals filed for this visit. BP Readings from Last 3 Encounters:  05/18/21 (!) 142/72  02/15/21 140/80  07/13/20 138/72   Wt Readings from Last 3 Encounters:  05/18/21 186 lb 3.2 oz (84.5 kg)  02/15/21 191 lb (86.6 kg)  07/13/20 185 lb (83.9 kg)   There is no height or weight on file to calculate BMI.    Physical Exam     Lab Results  Component Value Date   WBC 8.9 05/18/2021   HGB 12.1 (L) 05/18/2021   HCT 36.7 (L) 05/18/2021   PLT 191.0 05/18/2021   GLUCOSE 100 (H) 05/18/2021   CHOL 104 05/18/2021   TRIG 121.0 05/18/2021   HDL 34.10 (L) 05/18/2021   LDLDIRECT 116.7 02/03/2014   LDLCALC 46 05/18/2021   ALT 17 05/18/2021   AST 19 05/18/2021   NA 140 05/18/2021  K 4.5 05/18/2021   CL 105 05/18/2021   CREATININE 1.49 05/18/2021   BUN 23 05/18/2021   CO2 28 05/18/2021   TSH 0.78 05/06/2020   PSA 0.37 10/23/2007   INR 0.96 01/23/2013   HGBA1C 6.5 05/18/2021   MICROALBUR 18.1 (H) 02/15/2021     Assessment & Plan:    See Problem List for Assessment and Plan of chronic medical problems.

## 2021-11-23 ENCOUNTER — Ambulatory Visit (INDEPENDENT_AMBULATORY_CARE_PROVIDER_SITE_OTHER): Payer: Medicare Other | Admitting: Internal Medicine

## 2021-11-23 VITALS — BP 144/80 | HR 61 | Temp 98.6°F | Ht 68.25 in | Wt 180.0 lb

## 2021-11-23 DIAGNOSIS — E1142 Type 2 diabetes mellitus with diabetic polyneuropathy: Secondary | ICD-10-CM | POA: Diagnosis not present

## 2021-11-23 DIAGNOSIS — I10 Essential (primary) hypertension: Secondary | ICD-10-CM

## 2021-11-23 DIAGNOSIS — E782 Mixed hyperlipidemia: Secondary | ICD-10-CM

## 2021-11-23 DIAGNOSIS — N1831 Chronic kidney disease, stage 3a: Secondary | ICD-10-CM | POA: Diagnosis not present

## 2021-11-23 LAB — CBC WITH DIFFERENTIAL/PLATELET
Basophils Absolute: 0 10*3/uL (ref 0.0–0.1)
Basophils Relative: 0.6 % (ref 0.0–3.0)
Eosinophils Absolute: 0.3 10*3/uL (ref 0.0–0.7)
Eosinophils Relative: 4 % (ref 0.0–5.0)
HCT: 35.8 % — ABNORMAL LOW (ref 39.0–52.0)
Hemoglobin: 11.8 g/dL — ABNORMAL LOW (ref 13.0–17.0)
Lymphocytes Relative: 25.4 % (ref 12.0–46.0)
Lymphs Abs: 1.9 10*3/uL (ref 0.7–4.0)
MCHC: 32.9 g/dL (ref 30.0–36.0)
MCV: 83.6 fl (ref 78.0–100.0)
Monocytes Absolute: 0.6 10*3/uL (ref 0.1–1.0)
Monocytes Relative: 7.6 % (ref 3.0–12.0)
Neutro Abs: 4.7 10*3/uL (ref 1.4–7.7)
Neutrophils Relative %: 62.4 % (ref 43.0–77.0)
Platelets: 174 10*3/uL (ref 150.0–400.0)
RBC: 4.28 Mil/uL (ref 4.22–5.81)
RDW: 14.4 % (ref 11.5–15.5)
WBC: 7.6 10*3/uL (ref 4.0–10.5)

## 2021-11-23 LAB — LIPID PANEL
Cholesterol: 92 mg/dL (ref 0–200)
HDL: 34 mg/dL — ABNORMAL LOW (ref >39.00)
LDL Cholesterol: 39 mg/dL (ref 0–99)
NonHDL: 58.35
Total CHOL/HDL Ratio: 3
Triglycerides: 97 mg/dL (ref 0.0–149.0)
VLDL: 19.4 mg/dL (ref 0.0–40.0)

## 2021-11-23 LAB — COMPREHENSIVE METABOLIC PANEL
ALT: 17 U/L (ref 0–53)
AST: 20 U/L (ref 0–37)
Albumin: 4 g/dL (ref 3.5–5.2)
Alkaline Phosphatase: 70 U/L (ref 39–117)
BUN: 26 mg/dL — ABNORMAL HIGH (ref 6–23)
CO2: 26 mEq/L (ref 19–32)
Calcium: 9.5 mg/dL (ref 8.4–10.5)
Chloride: 104 mEq/L (ref 96–112)
Creatinine, Ser: 1.62 mg/dL — ABNORMAL HIGH (ref 0.40–1.50)
GFR: 40.88 mL/min — ABNORMAL LOW (ref >60.00)
Glucose, Bld: 177 mg/dL — ABNORMAL HIGH (ref 70–99)
Potassium: 4.8 mEq/L (ref 3.5–5.1)
Sodium: 138 mEq/L (ref 135–145)
Total Bilirubin: 0.4 mg/dL (ref 0.2–1.2)
Total Protein: 7 g/dL (ref 6.0–8.3)

## 2021-11-23 LAB — HEMOGLOBIN A1C: Hgb A1c MFr Bld: 9.7 % — ABNORMAL HIGH (ref 4.6–6.5)

## 2021-11-23 MED ORDER — HYDRALAZINE HCL 10 MG PO TABS: 10.0000 mg | ORAL_TABLET | Freq: Two times a day (BID) | ORAL | 5 refills | Status: DC

## 2021-11-23 NOTE — Assessment & Plan Note (Signed)
Chronic Mild Reviewed with him that he does have chronic kidney disease and the need to keep blood pressure and sugars very well controlled CMP

## 2021-11-23 NOTE — Assessment & Plan Note (Signed)
Chronic Regular exercise and healthy diet encouraged Check lipid panel  Continue Crestor 20 mg daily 

## 2021-11-23 NOTE — Assessment & Plan Note (Addendum)
Chronic Sugars well controlled-last A1c 6.5% Check A1c today Continue metformin 1000 mg twice daily Not currently taking glipizide-reevaluate after A1c Encouraged regular activity/exercise and diabetic diet

## 2021-11-23 NOTE — Assessment & Plan Note (Signed)
Chronic BP not ideally controlled Continue Coreg 25 mg twice daily, lisinopril 20 mg daily Given CKD need BP to be better controlled Start hydralazine 10 mg twice daily Advise monitoring blood pressure regularly cmp

## 2021-11-24 ENCOUNTER — Telehealth: Payer: Self-pay | Admitting: Internal Medicine

## 2021-11-24 NOTE — Telephone Encounter (Signed)
Call him with results   Your anemia is mild and stable.  Your other blood counts are normal.  Your cholesterol is good.   Your kidney function is decreased - slightly worse than 6 months ago - you do appear dehydrated. Your liver tests are normal.  Your sugars are not well controlled and much higher than 6 months ago.  He is supposed to be taking glipizide in addition to the metformin, but he is not currently taking that - not sure what happened with that medication.  We need to adjust his medication - is he ok with restarting the glipizide.  He also needs to work on his diet.

## 2021-11-25 NOTE — Telephone Encounter (Signed)
Left message for patient on both numbers listed to return call to clinic to discuss lab results

## 2021-11-28 NOTE — Telephone Encounter (Signed)
Patient called back and you were with other patients.  Please return his call.  thanks

## 2021-11-28 NOTE — Telephone Encounter (Signed)
Last read by Leary Roca at  5:10 PM on 11/25/2021.

## 2021-12-01 ENCOUNTER — Encounter: Payer: Self-pay | Admitting: Internal Medicine

## 2021-12-29 ENCOUNTER — Other Ambulatory Visit: Payer: Self-pay | Admitting: Internal Medicine

## 2021-12-30 DIAGNOSIS — Z8551 Personal history of malignant neoplasm of bladder: Secondary | ICD-10-CM | POA: Diagnosis not present

## 2022-01-12 DIAGNOSIS — H401131 Primary open-angle glaucoma, bilateral, mild stage: Secondary | ICD-10-CM | POA: Diagnosis not present

## 2022-01-12 DIAGNOSIS — H2513 Age-related nuclear cataract, bilateral: Secondary | ICD-10-CM | POA: Diagnosis not present

## 2022-02-12 ENCOUNTER — Other Ambulatory Visit: Payer: Self-pay | Admitting: Internal Medicine

## 2022-02-12 DIAGNOSIS — R1013 Epigastric pain: Secondary | ICD-10-CM

## 2022-03-10 ENCOUNTER — Other Ambulatory Visit (HOSPITAL_COMMUNITY): Payer: Self-pay | Admitting: Orthopedic Surgery

## 2022-03-10 DIAGNOSIS — M25552 Pain in left hip: Secondary | ICD-10-CM | POA: Diagnosis not present

## 2022-03-10 DIAGNOSIS — T8484XA Pain due to internal orthopedic prosthetic devices, implants and grafts, initial encounter: Secondary | ICD-10-CM

## 2022-03-10 DIAGNOSIS — M25559 Pain in unspecified hip: Secondary | ICD-10-CM | POA: Diagnosis not present

## 2022-03-14 ENCOUNTER — Encounter (HOSPITAL_COMMUNITY): Payer: Medicare Other

## 2022-03-14 ENCOUNTER — Encounter (HOSPITAL_COMMUNITY): Payer: Self-pay

## 2022-03-16 ENCOUNTER — Other Ambulatory Visit (HOSPITAL_COMMUNITY): Payer: Self-pay | Admitting: Orthopedic Surgery

## 2022-03-16 ENCOUNTER — Ambulatory Visit (HOSPITAL_COMMUNITY): Payer: Medicare Other

## 2022-03-16 ENCOUNTER — Ambulatory Visit: Payer: Self-pay | Admitting: Podiatry

## 2022-03-16 ENCOUNTER — Ambulatory Visit (HOSPITAL_COMMUNITY)
Admission: RE | Admit: 2022-03-16 | Discharge: 2022-03-16 | Disposition: A | Discharge status: Home / Self Care | Payer: Medicare Other | Source: Ambulatory Visit | Attending: Orthopedic Surgery | Admitting: Orthopedic Surgery

## 2022-03-16 DIAGNOSIS — Z96652 Presence of left artificial knee joint: Secondary | ICD-10-CM | POA: Insufficient documentation

## 2022-03-16 DIAGNOSIS — Z96649 Presence of unspecified artificial hip joint: Secondary | ICD-10-CM | POA: Insufficient documentation

## 2022-03-16 DIAGNOSIS — T8484XA Pain due to internal orthopedic prosthetic devices, implants and grafts, initial encounter: Secondary | ICD-10-CM

## 2022-03-23 ENCOUNTER — Encounter: Payer: Self-pay | Admitting: Internal Medicine

## 2022-03-23 DIAGNOSIS — E1142 Type 2 diabetes mellitus with diabetic polyneuropathy: Secondary | ICD-10-CM

## 2022-03-27 ENCOUNTER — Ambulatory Visit (HOSPITAL_COMMUNITY)
Admission: RE | Admit: 2022-03-27 | Discharge: 2022-03-27 | Disposition: A | Discharge status: Home / Self Care | Payer: Medicare Other | Source: Ambulatory Visit | Attending: Orthopedic Surgery | Admitting: Orthopedic Surgery

## 2022-03-27 DIAGNOSIS — Z96642 Presence of left artificial hip joint: Secondary | ICD-10-CM | POA: Diagnosis not present

## 2022-03-27 DIAGNOSIS — M25552 Pain in left hip: Secondary | ICD-10-CM | POA: Insufficient documentation

## 2022-03-27 DIAGNOSIS — T8484XA Pain due to internal orthopedic prosthetic devices, implants and grafts, initial encounter: Secondary | ICD-10-CM | POA: Diagnosis not present

## 2022-03-27 DIAGNOSIS — Z96649 Presence of unspecified artificial hip joint: Secondary | ICD-10-CM | POA: Diagnosis not present

## 2022-03-27 MED ORDER — TECHNETIUM TC 99M MEDRONATE IV KIT
20.0000 | PACK | Freq: Once | INTRAVENOUS | Status: AC | PRN
  Administered 2022-03-27: 20 via INTRAVENOUS

## 2022-03-29 ENCOUNTER — Other Ambulatory Visit: Payer: Self-pay | Admitting: Internal Medicine

## 2022-03-29 ENCOUNTER — Other Ambulatory Visit: Payer: Self-pay

## 2022-03-29 ENCOUNTER — Ambulatory Visit: Payer: Medicare Other | Admitting: Podiatry

## 2022-03-30 DIAGNOSIS — M25552 Pain in left hip: Secondary | ICD-10-CM | POA: Diagnosis not present

## 2022-04-01 ENCOUNTER — Other Ambulatory Visit: Payer: Self-pay | Admitting: Internal Medicine

## 2022-04-03 ENCOUNTER — Other Ambulatory Visit: Payer: Self-pay

## 2022-04-06 ENCOUNTER — Encounter: Payer: Self-pay | Admitting: Podiatry

## 2022-04-06 ENCOUNTER — Ambulatory Visit (INDEPENDENT_AMBULATORY_CARE_PROVIDER_SITE_OTHER): Payer: Medicare Other | Admitting: Podiatry

## 2022-04-06 DIAGNOSIS — E1149 Type 2 diabetes mellitus with other diabetic neurological complication: Secondary | ICD-10-CM

## 2022-04-06 DIAGNOSIS — E1140 Type 2 diabetes mellitus with diabetic neuropathy, unspecified: Secondary | ICD-10-CM | POA: Diagnosis not present

## 2022-04-06 DIAGNOSIS — M779 Enthesopathy, unspecified: Secondary | ICD-10-CM | POA: Diagnosis not present

## 2022-04-06 MED ORDER — GABAPENTIN 300 MG PO CAPS: 300.0000 mg | ORAL_CAPSULE | Freq: Three times a day (TID) | ORAL | 3 refills | Status: DC

## 2022-04-06 NOTE — Progress Notes (Signed)
Subjective:   Patient ID: Donald Carlson, male   DOB: 77 y.o.   MRN: [id number]   HPI Patient concerned about tingling and numbness and burning in both feet stating he has long-term diabetes has not been in good control over the last 6 months with A1c in the mid nines.  Patient does not smoke is not active   Review of Systems  All other systems reviewed and are negative.       Objective:  Physical Exam Vitals and nursing note reviewed.  Constitutional:      Appearance: He is well-developed.  Pulmonary:     Effort: Pulmonary effort is normal.  Musculoskeletal:        General: Normal range of motion.  Skin:    General: Skin is warm.  Neurological:     Mental Status: He is alert.     Vascular status found to be intact diminishment of sharp dull vibratory bilateral history of gout with inflammation of the joints with range of motion adequate no crepitus of the joint muscle strength adequate.  Patient is found to have good digital perfusion well-oriented x 3 with complaints of numbing burning in both feet and extending into the left lower leg     Assessment:  Probability that this is related to diabetic neuropathy with A1c's which have elevated significantly in the last 6 months to year with also inflammation and gout contributing factor     Plan:  H&P reviewed condition recommended home anti-inflammatories as needed and we will start gabapentin 300 mg in am and have him start 1 at night then add 1 morning 1 midday as needed and went over with him diabetes daily inspections of his feet and generalized education discussion of condition

## 2022-05-02 NOTE — Progress Notes (Unsigned)
    Subjective:    Patient ID: Donald Carlson, male    DOB: January 12, 1946, 77 y.o.   MRN: [id number]      HPI Donald Carlson is here for No chief complaint on file.   Redness left lower leg -      Medications and allergies reviewed with patient and updated if appropriate.  Current Outpatient Medications on File Prior to Visit  Medication Sig Dispense Refill   aspirin 81 MG tablet Take 1 tablet (81 mg total) by mouth 2 (two) times daily after a meal. (Patient taking differently: Take 81 mg by mouth daily.) 60 tablet 1   carvedilol (COREG) 25 MG tablet TAKE 1 TABLET BY MOUTH TWICE DAILY WITH MEALS 180 tablet 0   cholecalciferol (VITAMIN D) 1000 units tablet Take 1,000 Units by mouth daily.     docusate sodium (COLACE) 100 MG capsule Take 1 capsule (100 mg total) by mouth 2 (two) times daily. (Patient taking differently: Take 100 mg by mouth every other day.) 60 capsule 3   Flaxseed, Linseed, (FLAX SEED OIL) 1000 MG CAPS Take 1,000 mg by mouth daily.     gabapentin (NEURONTIN) 300 MG capsule Take 1 capsule (300 mg total) by mouth 3 (three) times daily. 90 capsule 3   glucose blood (ONE TOUCH ULTRA TEST) test strip Use to check blood sugars once a day and prn. Dx e11.9 100 each 1   Lancets (ONETOUCH ULTRASOFT) lancets Use as instructed to check sugar daily 100 each 12   latanoprost (XALATAN) 0.005 % ophthalmic solution Place 1 drop into both eyes at bedtime.     lisinopril (ZESTRIL) 20 MG tablet TAKE 1 TABLET(20 MG) BY MOUTH DAILY 90 tablet 3   metFORMIN (GLUCOPHAGE) 500 MG tablet TAKE 2 TABLETS(1000 MG) BY MOUTH TWICE DAILY 180 tablet 1   Multiple Vitamins-Iron (MULTIVITAMINS WITH IRON) TABS tablet Take 1 tablet by mouth daily. 30 tablet 0   pantoprazole (PROTONIX) 40 MG tablet TAKE 1 TABLET(40 MG) BY MOUTH TWICE DAILY 90 tablet 1   promethazine (PHENERGAN) 12.5 MG tablet Take 1 tablet (12.5 mg total) by mouth every 8 (eight) hours as needed for nausea or vomiting. 60 tablet 5   rosuvastatin  (CRESTOR) 20 MG tablet TAKE 1 TABLET(20 MG) BY MOUTH DAILY 90 tablet 1   Saw Palmetto, Serenoa repens, (SAW PALMETTO PO) Take 1 tablet by mouth every morning.     vitamin B-12 (CYANOCOBALAMIN) 1000 MCG tablet Take 1,000 mcg by mouth daily.     No current facility-administered medications on file prior to visit.    Review of Systems     Objective:  There were no vitals filed for this visit. BP Readings from Last 3 Encounters:  11/23/21 (!) 144/80  05/18/21 (!) 142/72  02/15/21 140/80   Wt Readings from Last 3 Encounters:  11/23/21 180 lb (81.6 kg)  05/18/21 186 lb 3.2 oz (84.5 kg)  02/15/21 191 lb (86.6 kg)   There is no height or weight on file to calculate BMI.    Physical Exam         Assessment & Plan:    See Problem List for Assessment and Plan of chronic medical problems.

## 2022-05-03 ENCOUNTER — Ambulatory Visit (INDEPENDENT_AMBULATORY_CARE_PROVIDER_SITE_OTHER): Payer: Medicare Other | Admitting: Internal Medicine

## 2022-05-03 ENCOUNTER — Encounter: Payer: Self-pay | Admitting: Internal Medicine

## 2022-05-03 VITALS — BP 136/80 | HR 60 | Temp 98.0°F | Ht 68.25 in | Wt 182.0 lb

## 2022-05-03 DIAGNOSIS — L03116 Cellulitis of left lower limb: Secondary | ICD-10-CM

## 2022-05-03 DIAGNOSIS — L02416 Cutaneous abscess of left lower limb: Secondary | ICD-10-CM | POA: Diagnosis not present

## 2022-05-03 DIAGNOSIS — E1142 Type 2 diabetes mellitus with diabetic polyneuropathy: Secondary | ICD-10-CM | POA: Diagnosis not present

## 2022-05-03 MED ORDER — AMOXICILLIN-POT CLAVULANATE 875-125 MG PO TABS: 1.0000 | ORAL_TABLET | Freq: Two times a day (BID) | ORAL | 0 refills | Status: AC

## 2022-05-03 NOTE — Assessment & Plan Note (Signed)
Chronic Lab Results  Component Value Date   HGBA1C 9.7 (H) 11/23/2021   Sugars not ideally controlled-he is coming for routine follow-up in 3 weeks so we will hold off and check his A1c at that time With his current infection sugars could be higher than ideal Continue metformin 1000 mg twice daily

## 2022-05-03 NOTE — Patient Instructions (Addendum)
         Medications changes include :   Augmentin twice daily x 10 days.      Return if symptoms worsen or fail to improve.

## 2022-05-03 NOTE — Assessment & Plan Note (Signed)
Acute Symptoms and exam suggestive of cellulitis He has a little bit of a different history since symptoms started 6 months ago and worsened in the past 4-6 weeks for no obvious reason Start Augmentin 875-125 mg twice daily x 10 days He has follow-up with me in about 3 weeks, but if symptoms or not improving or worsening I advised him to call and let me know

## 2022-05-22 ENCOUNTER — Other Ambulatory Visit: Payer: Self-pay | Admitting: Internal Medicine

## 2022-05-22 ENCOUNTER — Encounter: Payer: Self-pay | Admitting: Internal Medicine

## 2022-05-22 DIAGNOSIS — R1013 Epigastric pain: Secondary | ICD-10-CM

## 2022-05-23 ENCOUNTER — Encounter: Payer: Self-pay | Admitting: Internal Medicine

## 2022-05-23 NOTE — Progress Notes (Unsigned)
Subjective:    Patient ID: Donald Carlson, male    DOB: 1945/03/21, 77 y.o.   MRN: [id number]     HPI Donald Carlson is here for follow up of his chronic medical problems, including htn, DM, hld, CKD, B12 deficiency  Pain left medial distal forearm.  Has had pain in the left hand for a while that does not bother him that much - ? Connected.   Pain is constant in distal forearm - worse with squeezing the hand and doing things.  No N/T.  The pain stops him - but no weakness.    Cellulitis is better.  Having pain in lateral ankle and dorsal foot.   B/l N/T - diabetic neuro.    Medications and allergies reviewed with patient and updated if appropriate.  Current Outpatient Medications on File Prior to Visit  Medication Sig Dispense Refill   aspirin 81 MG tablet Take 1 tablet (81 mg total) by mouth 2 (two) times daily after a meal. (Patient taking differently: Take 81 mg by mouth daily.) 60 tablet 1   carvedilol (COREG) 25 MG tablet TAKE 1 TABLET BY MOUTH TWICE DAILY WITH MEALS 180 tablet 0   cholecalciferol (VITAMIN D) 1000 units tablet Take 1,000 Units by mouth daily.     docusate sodium (COLACE) 100 MG capsule Take 1 capsule (100 mg total) by mouth 2 (two) times daily. (Patient taking differently: Take 100 mg by mouth every other day.) 60 capsule 3   Flaxseed, Linseed, (FLAX SEED OIL) 1000 MG CAPS Take 1,000 mg by mouth daily.     gabapentin (NEURONTIN) 300 MG capsule Take 1 capsule (300 mg total) by mouth 3 (three) times daily. 90 capsule 3   glucose blood (ONE TOUCH ULTRA TEST) test strip Use to check blood sugars once a day and prn. Dx e11.9 100 each 1   Lancets (ONETOUCH ULTRASOFT) lancets Use as instructed to check sugar daily 100 each 12   latanoprost (XALATAN) 0.005 % ophthalmic solution Place 1 drop into both eyes at bedtime.     lisinopril (ZESTRIL) 20 MG tablet TAKE 1 TABLET(20 MG) BY MOUTH DAILY 90 tablet 3   metFORMIN (GLUCOPHAGE) 500 MG tablet TAKE 2 TABLETS(1000 MG) BY  MOUTH TWICE DAILY 180 tablet 1   Multiple Vitamins-Iron (MULTIVITAMINS WITH IRON) TABS tablet Take 1 tablet by mouth daily. 30 tablet 0   pantoprazole (PROTONIX) 40 MG tablet TAKE 1 TABLET(40 MG) BY MOUTH TWICE DAILY 90 tablet 1   promethazine (PHENERGAN) 12.5 MG tablet Take 1 tablet (12.5 mg total) by mouth every 8 (eight) hours as needed for nausea or vomiting. 60 tablet 5   rosuvastatin (CRESTOR) 20 MG tablet TAKE 1 TABLET(20 MG) BY MOUTH DAILY 90 tablet 1   Saw Palmetto, Serenoa repens, (SAW PALMETTO PO) Take 1 tablet by mouth every morning.     vitamin B-12 (CYANOCOBALAMIN) 1000 MCG tablet Take 1,000 mcg by mouth daily.     No current facility-administered medications on file prior to visit.     Review of Systems  Constitutional:  Negative for fever.  Respiratory:  Negative for cough, shortness of breath and wheezing.   Cardiovascular:  Positive for leg swelling (mild in left foot). Negative for chest pain and palpitations.  Neurological:  Negative for light-headedness and headaches.       Objective:   Vitals:   05/24/22 1312  BP: (!) 148/82  Pulse: 62  Temp: 98.4 F (36.9 C)  SpO2: 99%   BP Readings  from Last 3 Encounters:  05/24/22 (!) 148/82  05/03/22 136/80  11/23/21 (!) 144/80   Wt Readings from Last 3 Encounters:  05/24/22 180 lb (81.6 kg)  05/03/22 182 lb (82.6 kg)  11/23/21 180 lb (81.6 kg)   Body mass index is 27.17 kg/m.    Physical Exam     Lab Results  Component Value Date   WBC 9.6 05/24/2022   HGB 12.5 (L) 05/24/2022   HCT 36.9 (L) 05/24/2022   PLT 209.0 05/24/2022   GLUCOSE 217 (H) 05/24/2022   CHOL 101 05/24/2022   TRIG 120.0 05/24/2022   HDL 36.80 (L) 05/24/2022   LDLDIRECT 116.7 02/03/2014   LDLCALC 40 05/24/2022   ALT 15 05/24/2022   AST 15 05/24/2022   NA 139 05/24/2022   K 4.7 05/24/2022   CL 103 05/24/2022   CREATININE 1.31 05/24/2022   BUN 23 05/24/2022   CO2 29 05/24/2022   TSH 0.78 05/06/2020   PSA 0.37 10/23/2007    INR 0.96 01/23/2013   HGBA1C 9.9 (A) 05/24/2022   MICROALBUR 13.7 (H) 05/24/2022     Assessment & Plan:    See Problem List for Assessment and Plan of chronic medical problems.

## 2022-05-23 NOTE — Patient Instructions (Addendum)
      Blood work was ordered.   The lab is on the first floor.    Medications changes include :   jardiance 10 mg daily      Return in about 3 months (around 08/24/2022) for follow up.

## 2022-05-24 ENCOUNTER — Ambulatory Visit (INDEPENDENT_AMBULATORY_CARE_PROVIDER_SITE_OTHER): Payer: Medicare Other | Admitting: Internal Medicine

## 2022-05-24 ENCOUNTER — Encounter: Payer: Self-pay | Admitting: Internal Medicine

## 2022-05-24 VITALS — BP 148/82 | HR 62 | Temp 98.4°F | Ht 68.25 in | Wt 180.0 lb

## 2022-05-24 DIAGNOSIS — I10 Essential (primary) hypertension: Secondary | ICD-10-CM

## 2022-05-24 DIAGNOSIS — E1142 Type 2 diabetes mellitus with diabetic polyneuropathy: Secondary | ICD-10-CM

## 2022-05-24 DIAGNOSIS — N1831 Chronic kidney disease, stage 3a: Secondary | ICD-10-CM | POA: Diagnosis not present

## 2022-05-24 DIAGNOSIS — M25532 Pain in left wrist: Secondary | ICD-10-CM | POA: Insufficient documentation

## 2022-05-24 DIAGNOSIS — M25572 Pain in left ankle and joints of left foot: Secondary | ICD-10-CM | POA: Insufficient documentation

## 2022-05-24 DIAGNOSIS — E782 Mixed hyperlipidemia: Secondary | ICD-10-CM

## 2022-05-24 DIAGNOSIS — M79605 Pain in left leg: Secondary | ICD-10-CM

## 2022-05-24 LAB — CBC WITH DIFFERENTIAL/PLATELET
Basophils Absolute: 0.1 10*3/uL (ref 0.0–0.1)
Basophils Relative: 0.7 % (ref 0.0–3.0)
Eosinophils Absolute: 0.4 10*3/uL (ref 0.0–0.7)
Eosinophils Relative: 4.2 % (ref 0.0–5.0)
HCT: 36.9 % — ABNORMAL LOW (ref 39.0–52.0)
Hemoglobin: 12.5 g/dL — ABNORMAL LOW (ref 13.0–17.0)
Lymphocytes Relative: 22.5 % (ref 12.0–46.0)
Lymphs Abs: 2.2 10*3/uL (ref 0.7–4.0)
MCHC: 33.7 g/dL (ref 30.0–36.0)
MCV: 82.4 fl (ref 78.0–100.0)
Monocytes Absolute: 0.8 10*3/uL (ref 0.1–1.0)
Monocytes Relative: 8.5 % (ref 3.0–12.0)
Neutro Abs: 6.2 10*3/uL (ref 1.4–7.7)
Neutrophils Relative %: 64.1 % (ref 43.0–77.0)
Platelets: 209 10*3/uL (ref 150.0–400.0)
RBC: 4.48 Mil/uL (ref 4.22–5.81)
RDW: 13.6 % (ref 11.5–15.5)
WBC: 9.6 10*3/uL (ref 4.0–10.5)

## 2022-05-24 LAB — LIPID PANEL
Cholesterol: 101 mg/dL (ref 0–200)
HDL: 36.8 mg/dL — ABNORMAL LOW (ref >39.00)
LDL Cholesterol: 40 mg/dL (ref 0–99)
NonHDL: 63.77
Total CHOL/HDL Ratio: 3
Triglycerides: 120 mg/dL (ref 0.0–149.0)
VLDL: 24 mg/dL (ref 0.0–40.0)

## 2022-05-24 LAB — MICROALBUMIN / CREATININE URINE RATIO
Creatinine,U: 93.4 mg/dL
Microalb Creat Ratio: 14.6 mg/g (ref 0.0–30.0)
Microalb, Ur: 13.7 mg/dL — ABNORMAL HIGH (ref 0.0–1.9)

## 2022-05-24 LAB — COMPREHENSIVE METABOLIC PANEL
ALT: 15 U/L (ref 0–53)
AST: 15 U/L (ref 0–37)
Albumin: 4.1 g/dL (ref 3.5–5.2)
Alkaline Phosphatase: 71 U/L (ref 39–117)
BUN: 23 mg/dL (ref 6–23)
CO2: 29 mEq/L (ref 19–32)
Calcium: 10.1 mg/dL (ref 8.4–10.5)
Chloride: 103 mEq/L (ref 96–112)
Creatinine, Ser: 1.31 mg/dL (ref 0.40–1.50)
GFR: 52.57 mL/min — ABNORMAL LOW (ref >60.00)
Glucose, Bld: 217 mg/dL — ABNORMAL HIGH (ref 70–99)
Potassium: 4.7 mEq/L (ref 3.5–5.1)
Sodium: 139 mEq/L (ref 135–145)
Total Bilirubin: 0.5 mg/dL (ref 0.2–1.2)
Total Protein: 7.1 g/dL (ref 6.0–8.3)

## 2022-05-24 LAB — POCT GLYCOSYLATED HEMOGLOBIN (HGB A1C): Hemoglobin A1C: 9.9 % — AB (ref 4.0–5.6)

## 2022-05-24 MED ORDER — EMPAGLIFLOZIN 10 MG PO TABS: 10.0000 mg | ORAL_TABLET | Freq: Every day | ORAL | 5 refills | Status: DC

## 2022-05-24 NOTE — Assessment & Plan Note (Addendum)
Chronic   Lab Results  Component Value Date   HGBA1C 9.7 (H) 11/23/2021   A1c today POCT-9.9% Sugars not ideally controlled Check urine microalbumin today Continue metformin 1000 mg twice daily Start Jardiance 10 mg daily Stressed regular exercise, diabetic diet

## 2022-05-24 NOTE — Assessment & Plan Note (Signed)
Chronic Mild Stressed good blood pressure and sugar control Diet low in sodium Drink plenty of water No NSAIDs

## 2022-05-24 NOTE — Assessment & Plan Note (Addendum)
Chronic Blood pressure well controlled at home-elevated here today Advised him to monitor at home daily for the next 2 weeks CMP Continue Coreg 25 mg twice daily, lisinopril 20 mg daily  EKG today: NSR at 62 bpm, normal EKG.  No change compared to EKG from 04/2014

## 2022-05-24 NOTE — Assessment & Plan Note (Signed)
Chronic Regular exercise and healthy diet encouraged Check lipid panel  Continue Crestor 20 mg daily 

## 2022-05-24 NOTE — Assessment & Plan Note (Signed)
Acute Experiencing left lateral ankle pain that goes from the lower leg around the malleolus to the foot Possible tendinitis He will treat conservatively Deferred sports medicine referral today, but if no improvement he will let me know if he wants to be referred

## 2022-05-24 NOTE — Assessment & Plan Note (Signed)
New Started having medial anterior left wrist or distal forearm pain Pain with use, no pain at rest Also has some chronic left hand pain-?  Related or not Possible tendinitis Advised rest, symptomatic treatment If no improvement he will let me know and I will refer to sports medicine

## 2022-06-27 ENCOUNTER — Other Ambulatory Visit: Payer: Self-pay | Admitting: Internal Medicine

## 2022-07-12 ENCOUNTER — Other Ambulatory Visit: Payer: Self-pay

## 2022-07-18 DIAGNOSIS — H2513 Age-related nuclear cataract, bilateral: Secondary | ICD-10-CM | POA: Diagnosis not present

## 2022-07-18 DIAGNOSIS — E119 Type 2 diabetes mellitus without complications: Secondary | ICD-10-CM | POA: Diagnosis not present

## 2022-07-18 DIAGNOSIS — H401121 Primary open-angle glaucoma, left eye, mild stage: Secondary | ICD-10-CM | POA: Diagnosis not present

## 2022-08-08 ENCOUNTER — Other Ambulatory Visit: Payer: Self-pay | Admitting: Podiatry

## 2022-08-21 ENCOUNTER — Encounter: Payer: Self-pay | Admitting: Internal Medicine

## 2022-08-21 NOTE — Patient Instructions (Addendum)
      Blood work was ordered.   The lab is on the first floor.    Medications changes include :   [facility]      Return in about 3 months (around 11/22/2022) for Physical Exam.

## 2022-08-21 NOTE — Progress Notes (Unsigned)
Subjective:    Patient ID: Donald Carlson, male    DOB: Mar 06, 1946, 77 y.o.   MRN: [id number]     HPI Donald Carlson is here for follow up of his chronic medical problems.  Checking sugars - a little high - low 200's -- this am 179.  He is limiting his sugars and decreased his carbs.  Does not drink any sugary drinks.   Medications and allergies reviewed with patient and updated if appropriate.  Current Outpatient Medications on File Prior to Visit  Medication Sig Dispense Refill   aspirin 81 MG tablet Take 1 tablet (81 mg total) by mouth 2 (two) times daily after a meal. (Patient taking differently: Take 81 mg by mouth daily.) 60 tablet 1   carvedilol (COREG) 25 MG tablet TAKE 1 TABLET BY MOUTH TWICE DAILY WITH MEALS 180 tablet 0   cholecalciferol (VITAMIN D) 1000 units tablet Take 1,000 Units by mouth daily.     docusate sodium (COLACE) 100 MG capsule Take 1 capsule (100 mg total) by mouth 2 (two) times daily. (Patient taking differently: Take 100 mg by mouth every other day.) 60 capsule 3   Flaxseed, Linseed, (FLAX SEED OIL) 1000 MG CAPS Take 1,000 mg by mouth daily.     gabapentin (NEURONTIN) 300 MG capsule TAKE 1 CAPSULE(300 MG) BY MOUTH THREE TIMES DAILY 90 capsule 3   glucose blood (ONE TOUCH ULTRA TEST) test strip Use to check blood sugars once a day and prn. Dx e11.9 100 each 1   Lancets (ONETOUCH ULTRASOFT) lancets Use as instructed to check sugar daily 100 each 12   latanoprost (XALATAN) 0.005 % ophthalmic solution Place 1 drop into both eyes at bedtime.     lisinopril (ZESTRIL) 20 MG tablet TAKE 1 TABLET(20 MG) BY MOUTH DAILY 90 tablet 3   metFORMIN (GLUCOPHAGE) 500 MG tablet TAKE 2 TABLETS(1000 MG) BY MOUTH TWICE DAILY 180 tablet 1   Multiple Vitamins-Iron (MULTIVITAMINS WITH IRON) TABS tablet Take 1 tablet by mouth daily. 30 tablet 0   pantoprazole (PROTONIX) 40 MG tablet TAKE 1 TABLET(40 MG) BY MOUTH TWICE DAILY 90 tablet 1   promethazine (PHENERGAN) 12.5 MG tablet  Take 1 tablet (12.5 mg total) by mouth every 8 (eight) hours as needed for nausea or vomiting. 60 tablet 5   rosuvastatin (CRESTOR) 20 MG tablet TAKE 1 TABLET(20 MG) BY MOUTH DAILY 90 tablet 1   Saw Palmetto, Serenoa repens, (SAW PALMETTO PO) Take 1 tablet by mouth every morning.     vitamin B-12 (CYANOCOBALAMIN) 1000 MCG tablet Take 1,000 mcg by mouth daily.     No current facility-administered medications on file prior to visit.     Review of Systems  Constitutional:  Negative for fever.  Respiratory:  Positive for shortness of breath (occ - with moderate exercise). Negative for cough and wheezing.   Cardiovascular:  Positive for leg swelling (mild during the day, left foot chronically swollen - mild). Negative for chest pain and palpitations.  Neurological:  Negative for light-headedness and headaches.       Objective:   Vitals:   08/22/22 0912  BP: 130/68  Pulse: 65  Temp: 98.4 F (36.9 C)  SpO2: 95%   BP Readings from Last 3 Encounters:  08/22/22 130/68  05/24/22 (!) 148/82  05/03/22 136/80   Wt Readings from Last 3 Encounters:  08/22/22 178 lb (80.7 kg)  05/24/22 180 lb (81.6 kg)  05/03/22 182 lb (82.6 kg)   Body mass index is  26.87 kg/m.    Physical Exam Constitutional:      General: He is not in acute distress.    Appearance: Normal appearance. He is not ill-appearing.  HENT:     Head: Normocephalic and atraumatic.  Eyes:     Conjunctiva/sclera: Conjunctivae normal.  Cardiovascular:     Rate and Rhythm: Normal rate and regular rhythm.     Heart sounds: Normal heart sounds.  Pulmonary:     Effort: Pulmonary effort is normal. No respiratory distress.     Breath sounds: Normal breath sounds. No wheezing or rales.  Musculoskeletal:     Right lower leg: No edema.     Left lower leg: No edema.  Skin:    General: Skin is warm and dry.     Findings: No rash.  Neurological:     Mental Status: He is alert. Mental status is at baseline.  Psychiatric:         Mood and Affect: Mood normal.        Lab Results  Component Value Date   WBC 9.6 05/24/2022   HGB 12.5 (L) 05/24/2022   HCT 36.9 (L) 05/24/2022   PLT 209.0 05/24/2022   GLUCOSE 217 (H) 05/24/2022   CHOL 101 05/24/2022   TRIG 120.0 05/24/2022   HDL 36.80 (L) 05/24/2022   LDLDIRECT 116.7 02/03/2014   LDLCALC 40 05/24/2022   ALT 15 05/24/2022   AST 15 05/24/2022   NA 139 05/24/2022   K 4.7 05/24/2022   CL 103 05/24/2022   CREATININE 1.31 05/24/2022   BUN 23 05/24/2022   CO2 29 05/24/2022   TSH 0.78 05/06/2020   PSA 0.37 10/23/2007   INR 0.96 01/23/2013   HGBA1C 9.9 (A) 05/24/2022   MICROALBUR 13.7 (H) 05/24/2022     Assessment & Plan:    See Problem List for Assessment and Plan of chronic medical problems.

## 2022-08-22 ENCOUNTER — Ambulatory Visit (INDEPENDENT_AMBULATORY_CARE_PROVIDER_SITE_OTHER): Payer: Medicare Other | Admitting: Internal Medicine

## 2022-08-22 VITALS — BP 130/68 | HR 65 | Temp 98.4°F | Ht 68.25 in | Wt 178.0 lb

## 2022-08-22 DIAGNOSIS — Z7984 Long term (current) use of oral hypoglycemic drugs: Secondary | ICD-10-CM | POA: Diagnosis not present

## 2022-08-22 DIAGNOSIS — E1142 Type 2 diabetes mellitus with diabetic polyneuropathy: Secondary | ICD-10-CM

## 2022-08-22 DIAGNOSIS — E782 Mixed hyperlipidemia: Secondary | ICD-10-CM | POA: Diagnosis not present

## 2022-08-22 DIAGNOSIS — I10 Essential (primary) hypertension: Secondary | ICD-10-CM

## 2022-08-22 DIAGNOSIS — N1831 Chronic kidney disease, stage 3a: Secondary | ICD-10-CM

## 2022-08-22 DIAGNOSIS — E1140 Type 2 diabetes mellitus with diabetic neuropathy, unspecified: Secondary | ICD-10-CM | POA: Insufficient documentation

## 2022-08-22 LAB — COMPREHENSIVE METABOLIC PANEL
ALT: 15 U/L (ref 0–53)
AST: 18 U/L (ref 0–37)
Albumin: 4.2 g/dL (ref 3.5–5.2)
Alkaline Phosphatase: 53 U/L (ref 39–117)
BUN: 33 mg/dL — ABNORMAL HIGH (ref 6–23)
CO2: 28 mEq/L (ref 19–32)
Calcium: 9.7 mg/dL (ref 8.4–10.5)
Chloride: 105 mEq/L (ref 96–112)
Creatinine, Ser: 1.81 mg/dL — ABNORMAL HIGH (ref 0.40–1.50)
GFR: 35.6 mL/min — ABNORMAL LOW (ref >60.00)
Glucose, Bld: 182 mg/dL — ABNORMAL HIGH (ref 70–99)
Potassium: 4.4 mEq/L (ref 3.5–5.1)
Sodium: 139 mEq/L (ref 135–145)
Total Bilirubin: 0.5 mg/dL (ref 0.2–1.2)
Total Protein: 7.1 g/dL (ref 6.0–8.3)

## 2022-08-22 LAB — HEMOGLOBIN A1C: Hgb A1c MFr Bld: 8.8 % — ABNORMAL HIGH (ref 4.6–6.5)

## 2022-08-22 MED ORDER — EMPAGLIFLOZIN 25 MG PO TABS: 25.0000 mg | ORAL_TABLET | Freq: Every day | ORAL | 1 refills | Status: DC

## 2022-08-22 NOTE — Assessment & Plan Note (Signed)
Chronic Blood pressure well controlled at home-elevated here today CMP Continue Coreg 25 mg twice daily, lisinopril 20 mg daily

## 2022-08-22 NOTE — Assessment & Plan Note (Signed)
Chronic Regular exercise and healthy diet encouraged Check lipid panel  Continue Crestor 20 mg daily 

## 2022-08-22 NOTE — Assessment & Plan Note (Signed)
Chronic Sees podiatry  Taking neurontin 300 mg tid

## 2022-08-22 NOTE — Assessment & Plan Note (Addendum)
Chronic  Lab Results  Component Value Date   HGBA1C 9.9 (A) 05/24/2022   Sugars not ideally controlled Check A1c, CMP Continue metformin 1000 mg twice daily, Jardiance 10 mg daily Increase Jardiance to 25 mg daily Stressed regular exercise, diabetic diet

## 2022-08-23 NOTE — Addendum Note (Signed)
Addended by: Pincus Sanes on: 08/23/2022 08:07 PM   Modules accepted: Orders

## 2022-08-25 ENCOUNTER — Ambulatory Visit: Payer: Medicare Other | Admitting: Internal Medicine

## 2022-08-26 ENCOUNTER — Other Ambulatory Visit: Payer: Self-pay | Admitting: Internal Medicine

## 2022-08-26 DIAGNOSIS — R1013 Epigastric pain: Secondary | ICD-10-CM

## 2022-08-30 ENCOUNTER — Other Ambulatory Visit: Payer: Medicare Other

## 2022-09-01 ENCOUNTER — Other Ambulatory Visit (INDEPENDENT_AMBULATORY_CARE_PROVIDER_SITE_OTHER): Payer: Medicare Other

## 2022-09-01 DIAGNOSIS — N1831 Chronic kidney disease, stage 3a: Secondary | ICD-10-CM | POA: Diagnosis not present

## 2022-09-01 LAB — BASIC METABOLIC PANEL
BUN: 38 mg/dL — ABNORMAL HIGH (ref 6–23)
CO2: 25 mEq/L (ref 19–32)
Calcium: 9.6 mg/dL (ref 8.4–10.5)
Chloride: 104 mEq/L (ref 96–112)
Creatinine, Ser: 2.07 mg/dL — ABNORMAL HIGH (ref 0.40–1.50)
GFR: 30.3 mL/min — ABNORMAL LOW (ref >60.00)
Glucose, Bld: 207 mg/dL — ABNORMAL HIGH (ref 70–99)
Potassium: 4.9 mEq/L (ref 3.5–5.1)
Sodium: 138 mEq/L (ref 135–145)

## 2022-09-06 ENCOUNTER — Telehealth: Payer: Self-pay | Admitting: Internal Medicine

## 2022-09-06 DIAGNOSIS — E1142 Type 2 diabetes mellitus with diabetic polyneuropathy: Secondary | ICD-10-CM

## 2022-09-06 DIAGNOSIS — N1831 Chronic kidney disease, stage 3a: Secondary | ICD-10-CM

## 2022-09-06 NOTE — Telephone Encounter (Signed)
Please call and let him know his kidney function is worse.  I want to make sure he is not taking any NSAIDs including Advil, Aleve, ibuprofen, Excedrin or any BC powders.  He also needs to be drinking plenty of fluids.  We need to make sure his blood pressure and sugars are well-controlled to prevent further damage to his kidneys.  I would like him to come in and have a recheck of his kidney function with blood work.  If it still low we may need to revise some of his medications.  Blood work ordered.  He does not need to fast.

## 2022-09-07 DIAGNOSIS — H5310 Unspecified subjective visual disturbances: Secondary | ICD-10-CM | POA: Diagnosis not present

## 2022-09-07 DIAGNOSIS — H2513 Age-related nuclear cataract, bilateral: Secondary | ICD-10-CM | POA: Diagnosis not present

## 2022-09-08 NOTE — Telephone Encounter (Signed)
LVM for pt to call back about Dr. Lawerance Bach message.

## 2022-09-12 ENCOUNTER — Other Ambulatory Visit (INDEPENDENT_AMBULATORY_CARE_PROVIDER_SITE_OTHER): Payer: Medicare Other

## 2022-09-12 DIAGNOSIS — N1831 Chronic kidney disease, stage 3a: Secondary | ICD-10-CM

## 2022-09-12 DIAGNOSIS — E1142 Type 2 diabetes mellitus with diabetic polyneuropathy: Secondary | ICD-10-CM | POA: Diagnosis not present

## 2022-09-12 LAB — BASIC METABOLIC PANEL
BUN: 39 mg/dL — ABNORMAL HIGH (ref 6–23)
CO2: 27 mEq/L (ref 19–32)
Calcium: 10.3 mg/dL (ref 8.4–10.5)
Chloride: 104 mEq/L (ref 96–112)
Creatinine, Ser: 2 mg/dL — ABNORMAL HIGH (ref 0.40–1.50)
GFR: 31.57 mL/min — ABNORMAL LOW (ref >60.00)
Glucose, Bld: 241 mg/dL — ABNORMAL HIGH (ref 70–99)
Potassium: 5.2 mEq/L — ABNORMAL HIGH (ref 3.5–5.1)
Sodium: 139 mEq/L (ref 135–145)

## 2022-09-12 LAB — HEMOGLOBIN A1C: Hgb A1c MFr Bld: 8.8 % — ABNORMAL HIGH (ref 4.6–6.5)

## 2022-09-12 NOTE — Telephone Encounter (Signed)
[place] Callas with Pt he understands the concerns and states he will get labs done and will stay away from NSAIDS. Pt states the only different thing he is taking is gabapentin 300mg  TID no further questions

## 2022-09-13 ENCOUNTER — Encounter: Payer: Self-pay | Admitting: Internal Medicine

## 2022-09-13 DIAGNOSIS — N1832 Chronic kidney disease, stage 3b: Secondary | ICD-10-CM

## 2022-09-14 MED ORDER — INSULIN PEN NEEDLE 32G X 6 MM MISC: 11 refills | Status: DC

## 2022-09-14 MED ORDER — LANTUS SOLOSTAR 100 UNIT/ML ~~LOC~~ SOPN: 12.0000 [IU] | PEN_INJECTOR | Freq: Every day | SUBCUTANEOUS | 5 refills | Status: DC

## 2022-09-14 NOTE — Addendum Note (Signed)
Addended by: Pincus Sanes on: 09/14/2022 05:30 PM   Modules accepted: Orders

## 2022-09-15 ENCOUNTER — Encounter: Payer: Self-pay | Admitting: Internal Medicine

## 2022-09-18 ENCOUNTER — Other Ambulatory Visit: Payer: Self-pay | Admitting: Internal Medicine

## 2022-09-18 ENCOUNTER — Encounter: Payer: Self-pay | Admitting: Internal Medicine

## 2022-09-18 MED ORDER — LANTUS SOLOSTAR 100 UNIT/ML ~~LOC~~ SOPN: 20.0000 [IU] | PEN_INJECTOR | Freq: Every day | SUBCUTANEOUS | 5 refills | Status: DC

## 2022-09-20 ENCOUNTER — Telehealth: Payer: Self-pay | Admitting: Radiology

## 2022-09-20 NOTE — Telephone Encounter (Signed)
Left voice mail for patient to call back at [phone] to schedule Medicare Annual Wellness Visit    Last AWV:  02/04/20   Please schedule Sequential/Initial AWV with [facility] K. CMA

## 2022-09-25 ENCOUNTER — Other Ambulatory Visit: Payer: Self-pay | Admitting: Internal Medicine

## 2022-09-28 ENCOUNTER — Other Ambulatory Visit: Payer: Self-pay

## 2022-09-28 ENCOUNTER — Emergency Department (HOSPITAL_COMMUNITY): Payer: Medicare Other

## 2022-09-28 ENCOUNTER — Emergency Department (HOSPITAL_COMMUNITY)
Admission: EM | Admit: 2022-09-28 | Discharge: 2022-09-28 | Disposition: A | Discharge status: Home / Self Care | Payer: Medicare Other | Attending: Emergency Medicine | Admitting: Emergency Medicine

## 2022-09-28 ENCOUNTER — Encounter (HOSPITAL_COMMUNITY): Payer: Self-pay | Admitting: Emergency Medicine

## 2022-09-28 DIAGNOSIS — W294XXA Contact with nail gun, initial encounter: Secondary | ICD-10-CM | POA: Diagnosis not present

## 2022-09-28 DIAGNOSIS — E119 Type 2 diabetes mellitus without complications: Secondary | ICD-10-CM | POA: Diagnosis not present

## 2022-09-28 DIAGNOSIS — Z23 Encounter for immunization: Secondary | ICD-10-CM | POA: Diagnosis not present

## 2022-09-28 DIAGNOSIS — S199XXA Unspecified injury of neck, initial encounter: Secondary | ICD-10-CM | POA: Diagnosis not present

## 2022-09-28 DIAGNOSIS — Z8551 Personal history of malignant neoplasm of bladder: Secondary | ICD-10-CM | POA: Diagnosis not present

## 2022-09-28 DIAGNOSIS — Z96642 Presence of left artificial hip joint: Secondary | ICD-10-CM | POA: Insufficient documentation

## 2022-09-28 DIAGNOSIS — I10 Essential (primary) hypertension: Secondary | ICD-10-CM | POA: Diagnosis not present

## 2022-09-28 DIAGNOSIS — Z85820 Personal history of malignant melanoma of skin: Secondary | ICD-10-CM | POA: Insufficient documentation

## 2022-09-28 DIAGNOSIS — S299XXA Unspecified injury of thorax, initial encounter: Secondary | ICD-10-CM | POA: Diagnosis not present

## 2022-09-28 DIAGNOSIS — I700 Atherosclerosis of aorta: Secondary | ICD-10-CM | POA: Diagnosis not present

## 2022-09-28 DIAGNOSIS — S1193XA Puncture wound without foreign body of unspecified part of neck, initial encounter: Secondary | ICD-10-CM | POA: Insufficient documentation

## 2022-09-28 LAB — I-STAT CHEM 8, ED
BUN: 37 mg/dL — ABNORMAL HIGH (ref 8–23)
Calcium, Ion: 1.19 mmol/L (ref 1.15–1.40)
Chloride: 106 mmol/L (ref 98–111)
Creatinine, Ser: 2.2 mg/dL — ABNORMAL HIGH (ref 0.61–1.24)
Glucose, Bld: 199 mg/dL — ABNORMAL HIGH (ref 70–99)
HCT: 38 % — ABNORMAL LOW (ref 39.0–52.0)
Hemoglobin: 12.9 g/dL — ABNORMAL LOW (ref 13.0–17.0)
Potassium: 5.3 mmol/L — ABNORMAL HIGH (ref 3.5–5.1)
Sodium: 138 mmol/L (ref 135–145)
TCO2: 26 mmol/L (ref 22–32)

## 2022-09-28 LAB — CBC
HCT: 39.1 % (ref 39.0–52.0)
Hemoglobin: 12.8 g/dL — ABNORMAL LOW (ref 13.0–17.0)
MCH: 28.7 pg (ref 26.0–34.0)
MCHC: 32.7 g/dL (ref 30.0–36.0)
MCV: 87.7 fL (ref 80.0–100.0)
Platelets: 204 10*3/uL (ref 150–400)
RBC: 4.46 MIL/uL (ref 4.22–5.81)
RDW: 13.8 % (ref 11.5–15.5)
WBC: 10.6 10*3/uL — ABNORMAL HIGH (ref 4.0–10.5)
nRBC: 0 % (ref 0.0–0.2)

## 2022-09-28 LAB — BASIC METABOLIC PANEL
Anion gap: 11 (ref 5–15)
BUN: 33 mg/dL — ABNORMAL HIGH (ref 8–23)
CO2: 25 mmol/L (ref 22–32)
Calcium: 9.7 mg/dL (ref 8.9–10.3)
Chloride: 100 mmol/L (ref 98–111)
Creatinine, Ser: 2.09 mg/dL — ABNORMAL HIGH (ref 0.61–1.24)
GFR, Estimated: 32 mL/min — ABNORMAL LOW (ref >60)
Glucose, Bld: 209 mg/dL — ABNORMAL HIGH (ref 70–99)
Potassium: 5.2 mmol/L — ABNORMAL HIGH (ref 3.5–5.1)
Sodium: 136 mmol/L (ref 135–145)

## 2022-09-28 MED ORDER — LACTATED RINGERS IV BOLUS
1000.0000 mL | Freq: Once | INTRAVENOUS | Status: AC
  Administered 2022-09-28: 1000 mL via INTRAVENOUS

## 2022-09-28 MED ORDER — TETANUS-DIPHTH-ACELL PERTUSSIS 5-2.5-18.5 LF-MCG/0.5 IM SUSY
0.5000 mL | PREFILLED_SYRINGE | Freq: Once | INTRAMUSCULAR | Status: AC
  Administered 2022-09-28: 0.5 mL via INTRAMUSCULAR

## 2022-09-28 MED ORDER — TETANUS-DIPHTHERIA TOXOIDS TD 5-2 LFU IM INJ
0.5000 mL | INJECTION | Freq: Once | INTRAMUSCULAR | Status: DC
  Filled 2022-09-28: qty 0.5

## 2022-09-28 MED ORDER — IOHEXOL 350 MG/ML SOLN
75.0000 mL | Freq: Once | INTRAVENOUS | Status: AC | PRN
  Administered 2022-09-28: 75 mL via INTRAVENOUS

## 2022-09-28 MED ORDER — FENTANYL CITRATE PF 50 MCG/ML IJ SOSY
25.0000 ug | PREFILLED_SYRINGE | Freq: Once | INTRAMUSCULAR | Status: AC
  Administered 2022-09-28: 25 ug via INTRAVENOUS
  Filled 2022-09-28: qty 1

## 2022-09-28 NOTE — ED Provider Notes (Signed)
It is medically necessary for the patient to receive the requested CT scan despite documented kidney function.  See below.   Acute Kidney Injury After Computed Tomography: A Meta-analysis Aycock et al.  https://brooks.[email or web address] Study objective Computed tomography (CT) is an important imaging modality used in the diagnosis of a variety of disorders. Imaging quality may be improved if intravenous contrast is added, but there is a concern for potential renal injury. Our goal is to perform a meta-analysis to compare the risk of acute kidney injury, need for renal replacement, and total mortality after contrast-enhanced CT versus noncontrast CT. Methods We searched MEDLINE (PubMed), the Cochrane Library, [employer], Hotel manager, Educational psychologist, Music therapist for relevant articles. Included articles specifically compared rates of renal insufficiency, need for renal replacement therapy, or mortality in patients who received intravenous contrast versus those who received no contrast. Results The database search returned 14,691 articles, inclusive of duplicates. Twenty-six unique articles met our inclusion criteria, with an additional 2 articles found through hand searching. In total, 28 studies involving 107,335 participants were included in the final analysis, all of which were observational. Meta-analysis demonstrated that, compared with noncontrast CT, contrast-enhanced CT was not significantly associated with either acute kidney injury (odds ratio [OR] 0.94; 95% confidence interval [CI] 0.83 to 1.07), need for renal replacement therapy (OR 0.83; 95% CI 0.59 to 1.16), or all-cause mortality (OR 1.0; 95% CI 0.73 to 1.36) Conclusion We found no significant differences in our principal study outcomes between patients receiving contrast-enhanced CT versus those receiving noncontrast CT. Given similar frequencies of acute kidney injury in patients receiving noncontrast CT, other  patient- and illness-level factors, rather than the use of contrast material, likely contribute to the development of acute kidney injury.    Fayrene Helper, MD 09/28/22 1846    Melene Plan, DO 09/28/22 1926
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
## 2022-09-28 NOTE — Progress Notes (Signed)
Orthopedic Tech Progress Note Patient Details:  Donald Carlson 06/24/1945 [id number]  Level II trauma, not needed at this time  Patient ID: [name], male   DOB: [birth year], 77 y.o.   MRN: [id number]  Docia Furl 09/28/2022, 7:14 PM

## 2022-09-28 NOTE — ED Notes (Signed)
Discharge instructions reviewed with pt. Opportunity for questions and concerns provided.   Follow up encouraged with PCP.   Symptoms to monitor discussed.   Displays no signs of distress. Alert, oriented and ambulatory on departure with spouse.

## 2022-09-28 NOTE — ED Notes (Signed)
Pt to CT with Trauma RN Orpha Bur and Autumn RN

## 2022-10-26 ENCOUNTER — Encounter: Payer: Self-pay | Admitting: Internal Medicine

## 2022-10-26 MED ORDER — DEXCOM G7 SENSOR MISC: 5 refills | Status: DC

## 2022-10-26 MED ORDER — DEXCOM G7 RECEIVER DEVI: 0 refills | Status: AC

## 2022-10-26 MED ORDER — LANTUS SOLOSTAR 100 UNIT/ML ~~LOC~~ SOPN: 26.0000 [IU] | PEN_INJECTOR | Freq: Every day | SUBCUTANEOUS | Status: DC

## 2022-11-02 ENCOUNTER — Encounter (INDEPENDENT_AMBULATORY_CARE_PROVIDER_SITE_OTHER): Payer: Self-pay

## 2022-11-05 ENCOUNTER — Encounter: Payer: Self-pay | Admitting: Internal Medicine

## 2022-11-06 ENCOUNTER — Telehealth: Payer: Self-pay | Admitting: Internal Medicine

## 2022-11-06 NOTE — Telephone Encounter (Signed)
Patient's wife called and said the pharmacy has not received the prescription for insulin glargine (LANTUS SOLOSTAR) 100 UNIT/ML Solostar Pen . They would like to know if it can be re-sent to [id number] - [place], [place] - [address] for 26 units.  Patient only has enough to last tomorrow.  Best callback for patient is [phone]. Best callback for Sallye Ober is [phone].

## 2022-11-07 ENCOUNTER — Other Ambulatory Visit: Payer: Self-pay

## 2022-11-07 MED ORDER — LANTUS SOLOSTAR 100 UNIT/ML ~~LOC~~ SOPN: 26.0000 [IU] | PEN_INJECTOR | Freq: Every day | SUBCUTANEOUS | 2 refills | Status: DC

## 2022-11-07 NOTE — Telephone Encounter (Signed)
Script re-faxed today.

## 2022-11-08 DIAGNOSIS — E1122 Type 2 diabetes mellitus with diabetic chronic kidney disease: Secondary | ICD-10-CM | POA: Diagnosis not present

## 2022-11-08 DIAGNOSIS — I129 Hypertensive chronic kidney disease with stage 1 through stage 4 chronic kidney disease, or unspecified chronic kidney disease: Secondary | ICD-10-CM | POA: Diagnosis not present

## 2022-11-08 DIAGNOSIS — N189 Chronic kidney disease, unspecified: Secondary | ICD-10-CM | POA: Diagnosis not present

## 2022-11-08 DIAGNOSIS — D631 Anemia in chronic kidney disease: Secondary | ICD-10-CM | POA: Diagnosis not present

## 2022-11-08 DIAGNOSIS — N2581 Secondary hyperparathyroidism of renal origin: Secondary | ICD-10-CM | POA: Diagnosis not present

## 2022-11-08 DIAGNOSIS — N1832 Chronic kidney disease, stage 3b: Secondary | ICD-10-CM | POA: Diagnosis not present

## 2022-11-09 LAB — LAB REPORT - SCANNED
Albumin, Urine POC: 26.2
Albumin/Creatinine Ratio, Urine, POC: 44
Creatinine, POC: 59.3 mg/dL
EGFR: 40

## 2022-11-14 ENCOUNTER — Other Ambulatory Visit: Payer: Self-pay | Admitting: Nephrology

## 2022-11-14 DIAGNOSIS — N1832 Chronic kidney disease, stage 3b: Secondary | ICD-10-CM

## 2022-11-15 ENCOUNTER — Ambulatory Visit: Admission: RE | Admit: 2022-11-15 | Payer: Medicare Other | Source: Ambulatory Visit

## 2022-11-15 DIAGNOSIS — N186 End stage renal disease: Secondary | ICD-10-CM | POA: Diagnosis not present

## 2022-11-15 DIAGNOSIS — N281 Cyst of kidney, acquired: Secondary | ICD-10-CM | POA: Diagnosis not present

## 2022-11-15 DIAGNOSIS — N1832 Chronic kidney disease, stage 3b: Secondary | ICD-10-CM

## 2022-11-20 ENCOUNTER — Other Ambulatory Visit: Payer: Self-pay | Admitting: Internal Medicine

## 2022-11-20 DIAGNOSIS — R1013 Epigastric pain: Secondary | ICD-10-CM

## 2022-11-21 NOTE — Progress Notes (Signed)
Subjective:    Patient ID: Donald Carlson, male    DOB: 27-Apr-1945, 77 y.o.   MRN: [id number]     HPI Donald Carlson is here for follow up of his chronic medical problems.  Doing insulin.  No issues.   Sugars better - still elevated - 203 almost 2 hrs after eating. 140-150's during day.  Fasting 40's on occasion.  He is wearing his monitor and it does alerted him that his sugars are too high or too low  BP variable 160/80, 109/70, rarely lower  Overall he feels well.  Medications and allergies reviewed with patient and updated if appropriate.  Current Outpatient Medications on File Prior to Visit  Medication Sig Dispense Refill   aspirin 81 MG tablet Take 1 tablet (81 mg total) by mouth 2 (two) times daily after a meal. (Patient taking differently: Take 81 mg by mouth daily.) 60 tablet 1   carvedilol (COREG) 25 MG tablet TAKE 1 TABLET BY MOUTH TWICE DAILY WITH MEALS 180 tablet 0   cholecalciferol (VITAMIN D) 1000 units tablet Take 1,000 Units by mouth daily.     Continuous Glucose Receiver (DEXCOM G7 RECEIVER) DEVI UAD to check sugars.  IDDM 1 each 0   Continuous Glucose Sensor (DEXCOM G7 SENSOR) MISC UAD to check sugars.  IDDM 3 each 5   docusate sodium (COLACE) 100 MG capsule Take 1 capsule (100 mg total) by mouth 2 (two) times daily. (Patient taking differently: Take 100 mg by mouth every other day.) 60 capsule 3   empagliflozin (JARDIANCE) 25 MG TABS tablet Take 1 tablet (25 mg total) by mouth daily before breakfast. 90 tablet 1   Flaxseed, Linseed, (FLAX SEED OIL) 1000 MG CAPS Take 1,000 mg by mouth daily.     gabapentin (NEURONTIN) 300 MG capsule TAKE 1 CAPSULE(300 MG) BY MOUTH THREE TIMES DAILY 90 capsule 3   glucose blood (ONE TOUCH ULTRA TEST) test strip Use to check blood sugars once a day and prn. Dx e11.9 100 each 1   insulin glargine (LANTUS SOLOSTAR) 100 UNIT/ML Solostar Pen Inject 26 Units into the skin daily. 15 mL 2   Insulin Pen Needle 32G X 6 MM MISC UAD for  saxenda injections 30 each 11   Lancets (ONETOUCH ULTRASOFT) lancets Use as instructed to check sugar daily 100 each 12   latanoprost (XALATAN) 0.005 % ophthalmic solution Place 1 drop into both eyes at bedtime.     lisinopril (ZESTRIL) 20 MG tablet TAKE 1 TABLET(20 MG) BY MOUTH DAILY 90 tablet 3   Multiple Vitamins-Iron (MULTIVITAMINS WITH IRON) TABS tablet Take 1 tablet by mouth daily. 30 tablet 0   pantoprazole (PROTONIX) 40 MG tablet TAKE 1 TABLET(40 MG) BY MOUTH TWICE DAILY 90 tablet 1   promethazine (PHENERGAN) 12.5 MG tablet Take 1 tablet (12.5 mg total) by mouth every 8 (eight) hours as needed for nausea or vomiting. 60 tablet 5   rosuvastatin (CRESTOR) 20 MG tablet TAKE 1 TABLET(20 MG) BY MOUTH DAILY 90 tablet 1   Saw Palmetto, Serenoa repens, (SAW PALMETTO PO) Take 1 tablet by mouth every morning.     vitamin B-12 (CYANOCOBALAMIN) 1000 MCG tablet Take 1,000 mcg by mouth daily.     No current facility-administered medications on file prior to visit.     Review of Systems  Constitutional:  Negative for fever.  Respiratory:  Negative for cough, shortness of breath and wheezing.   Cardiovascular:  Positive for leg swelling (mild - LLE). Negative for  chest pain and palpitations.  Neurological:  Positive for light-headedness (occ when BP drops) and headaches (rare).       Objective:   Vitals:   11/22/22 1030  BP: 110/76  Pulse: 80  Temp: 98.3 F (36.8 C)  SpO2: 96%   BP Readings from Last 3 Encounters:  11/22/22 110/76  09/28/22 (!) 160/81  08/22/22 130/68   Wt Readings from Last 3 Encounters:  11/22/22 182 lb (82.6 kg)  09/28/22 176 lb (79.8 kg)  08/22/22 178 lb (80.7 kg)   Body mass index is 27.67 kg/m.    Physical Exam Constitutional:      General: He is not in acute distress.    Appearance: Normal appearance. He is not ill-appearing.  HENT:     Head: Normocephalic and atraumatic.  Eyes:     Conjunctiva/sclera: Conjunctivae normal.  Cardiovascular:      Rate and Rhythm: Normal rate and regular rhythm.     Heart sounds: Normal heart sounds.  Pulmonary:     Effort: Pulmonary effort is normal. No respiratory distress.     Breath sounds: Normal breath sounds. No wheezing or rales.  Musculoskeletal:     Right lower leg: No edema.     Left lower leg: No edema.  Skin:    General: Skin is warm and dry.     Findings: No rash.  Neurological:     Mental Status: He is alert. Mental status is at baseline.  Psychiatric:        Mood and Affect: Mood normal.        Lab Results  Component Value Date   WBC 10.6 (H) 09/28/2022   HGB 12.9 (L) 09/28/2022   HCT 38.0 (L) 09/28/2022   PLT 204 09/28/2022   GLUCOSE 199 (H) 09/28/2022   CHOL 101 05/24/2022   TRIG 120.0 05/24/2022   HDL 36.80 (L) 05/24/2022   LDLDIRECT 116.7 02/03/2014   LDLCALC 40 05/24/2022   ALT 15 08/22/2022   AST 18 08/22/2022   NA 138 09/28/2022   K 5.3 (H) 09/28/2022   CL 106 09/28/2022   CREATININE 2.20 (H) 09/28/2022   BUN 37 (H) 09/28/2022   CO2 25 09/28/2022   TSH 0.78 05/06/2020   PSA 0.37 10/23/2007   INR 0.96 01/23/2013   HGBA1C 8.8 (H) 09/12/2022   MICROALBUR 13.7 (H) 05/24/2022     Assessment & Plan:    See Problem List for Assessment and Plan of chronic medical problems.

## 2022-11-21 NOTE — Patient Instructions (Addendum)
      Blood work was ordered.   The lab is on the first floor.    Medications changes include :   none      Return in about 3 months (around 02/21/2023) for follow up.

## 2022-11-22 ENCOUNTER — Encounter: Payer: Self-pay | Admitting: Internal Medicine

## 2022-11-22 ENCOUNTER — Ambulatory Visit: Payer: Medicare Other | Admitting: Internal Medicine

## 2022-11-22 VITALS — BP 110/76 | HR 80 | Temp 98.3°F | Ht 68.0 in | Wt 182.0 lb

## 2022-11-22 DIAGNOSIS — Z794 Long term (current) use of insulin: Secondary | ICD-10-CM | POA: Diagnosis not present

## 2022-11-22 DIAGNOSIS — I10 Essential (primary) hypertension: Secondary | ICD-10-CM

## 2022-11-22 DIAGNOSIS — E1142 Type 2 diabetes mellitus with diabetic polyneuropathy: Secondary | ICD-10-CM | POA: Diagnosis not present

## 2022-11-22 DIAGNOSIS — Z7984 Long term (current) use of oral hypoglycemic drugs: Secondary | ICD-10-CM

## 2022-11-22 DIAGNOSIS — N1831 Chronic kidney disease, stage 3a: Secondary | ICD-10-CM | POA: Diagnosis not present

## 2022-11-22 DIAGNOSIS — E782 Mixed hyperlipidemia: Secondary | ICD-10-CM

## 2022-11-22 LAB — COMPREHENSIVE METABOLIC PANEL
ALT: 15 U/L (ref 0–53)
AST: 16 U/L (ref 0–37)
Albumin: 3.9 g/dL (ref 3.5–5.2)
Alkaline Phosphatase: 58 U/L (ref 39–117)
BUN: 35 mg/dL — ABNORMAL HIGH (ref 6–23)
CO2: 26 meq/L (ref 19–32)
Calcium: 9.4 mg/dL (ref 8.4–10.5)
Chloride: 109 meq/L (ref 96–112)
Creatinine, Ser: 1.94 mg/dL — ABNORMAL HIGH (ref 0.40–1.50)
GFR: 32.7 mL/min — ABNORMAL LOW (ref >60.00)
Glucose, Bld: 206 mg/dL — ABNORMAL HIGH (ref 70–99)
Potassium: 4.9 meq/L (ref 3.5–5.1)
Sodium: 140 meq/L (ref 135–145)
Total Bilirubin: 0.5 mg/dL (ref 0.2–1.2)
Total Protein: 6.9 g/dL (ref 6.0–8.3)

## 2022-11-22 LAB — LIPID PANEL
Cholesterol: 102 mg/dL (ref 0–200)
HDL: 30.5 mg/dL — ABNORMAL LOW (ref >39.00)
LDL Cholesterol: 38 mg/dL (ref 0–99)
NonHDL: 71.93
Total CHOL/HDL Ratio: 3
Triglycerides: 169 mg/dL — ABNORMAL HIGH (ref 0.0–149.0)
VLDL: 33.8 mg/dL (ref 0.0–40.0)

## 2022-11-22 LAB — CBC WITH DIFFERENTIAL/PLATELET
Basophils Absolute: 0 10*3/uL (ref 0.0–0.1)
Basophils Relative: 0.5 % (ref 0.0–3.0)
Eosinophils Absolute: 0.3 10*3/uL (ref 0.0–0.7)
Eosinophils Relative: 3.9 % (ref 0.0–5.0)
HCT: 36.6 % — ABNORMAL LOW (ref 39.0–52.0)
Hemoglobin: 12 g/dL — ABNORMAL LOW (ref 13.0–17.0)
Lymphocytes Relative: 22.1 % (ref 12.0–46.0)
Lymphs Abs: 1.7 10*3/uL (ref 0.7–4.0)
MCHC: 32.8 g/dL (ref 30.0–36.0)
MCV: 86.3 fl (ref 78.0–100.0)
Monocytes Absolute: 0.6 10*3/uL (ref 0.1–1.0)
Monocytes Relative: 8.2 % (ref 3.0–12.0)
Neutro Abs: 5 10*3/uL (ref 1.4–7.7)
Neutrophils Relative %: 65.3 % (ref 43.0–77.0)
Platelets: 176 10*3/uL (ref 150.0–400.0)
RBC: 4.24 Mil/uL (ref 4.22–5.81)
RDW: 14.2 % (ref 11.5–15.5)
WBC: 7.7 10*3/uL (ref 4.0–10.5)

## 2022-11-22 LAB — HEMOGLOBIN A1C: Hgb A1c MFr Bld: 9.5 % — ABNORMAL HIGH (ref 4.6–6.5)

## 2022-11-22 NOTE — Assessment & Plan Note (Addendum)
Chronic Mild Stressed good blood pressure and sugar control Diet low in sodium Drink plenty of water No NSAIDs Cmp, cbc Had renal ultrasound and to see nephrology soon

## 2022-11-22 NOTE — Assessment & Plan Note (Addendum)
Chronic Blood pressure well controlled at home, but variable-continue to monitor closely BP well-controlled here CMP Continue Coreg 25 mg twice daily, lisinopril 20 mg daily

## 2022-11-22 NOTE — Assessment & Plan Note (Signed)
Chronic Regular exercise and healthy diet encouraged Check lipid panel  Continue Crestor 20 mg daily 

## 2022-11-22 NOTE — Assessment & Plan Note (Addendum)
Chronic  Lab Results  Component Value Date   HGBA1C 8.8 (H) 09/12/2022   Sugars not ideally controlled Check A1c, CMP Continue Jardiance 25 mg daily, lantus insulin 26 units daily Sugars variable-check A1c to determine if changes need to be made Has continuous glucose monitor and can warn him if sugars are high/low Stressed regular exercise, diabetic diet

## 2022-11-23 NOTE — Addendum Note (Signed)
Addended by: Pincus Sanes on: 11/23/2022 07:19 PM   Modules accepted: Orders

## 2022-11-24 DIAGNOSIS — N1832 Chronic kidney disease, stage 3b: Secondary | ICD-10-CM | POA: Diagnosis not present

## 2022-12-04 DIAGNOSIS — E782 Mixed hyperlipidemia: Secondary | ICD-10-CM | POA: Diagnosis not present

## 2022-12-04 DIAGNOSIS — N1831 Chronic kidney disease, stage 3a: Secondary | ICD-10-CM | POA: Diagnosis not present

## 2022-12-04 DIAGNOSIS — I10 Essential (primary) hypertension: Secondary | ICD-10-CM | POA: Diagnosis not present

## 2022-12-04 DIAGNOSIS — E1142 Type 2 diabetes mellitus with diabetic polyneuropathy: Secondary | ICD-10-CM | POA: Diagnosis not present

## 2022-12-06 ENCOUNTER — Encounter: Payer: Self-pay | Admitting: Internal Medicine

## 2022-12-07 ENCOUNTER — Telehealth: Payer: Self-pay

## 2022-12-11 ENCOUNTER — Other Ambulatory Visit (HOSPITAL_COMMUNITY): Payer: Self-pay

## 2022-12-13 DIAGNOSIS — E1142 Type 2 diabetes mellitus with diabetic polyneuropathy: Secondary | ICD-10-CM | POA: Diagnosis not present

## 2022-12-24 ENCOUNTER — Other Ambulatory Visit: Payer: Self-pay | Admitting: Internal Medicine

## 2023-01-09 NOTE — Telephone Encounter (Signed)
Rec'd completed patient portion of application,   Faxed provider page to pcp

## 2023-01-15 DIAGNOSIS — E1142 Type 2 diabetes mellitus with diabetic polyneuropathy: Secondary | ICD-10-CM | POA: Diagnosis not present

## 2023-01-17 ENCOUNTER — Other Ambulatory Visit: Payer: Self-pay | Admitting: Podiatry

## 2023-01-18 DIAGNOSIS — H2513 Age-related nuclear cataract, bilateral: Secondary | ICD-10-CM | POA: Diagnosis not present

## 2023-01-18 DIAGNOSIS — D3131 Benign neoplasm of right choroid: Secondary | ICD-10-CM | POA: Diagnosis not present

## 2023-01-18 DIAGNOSIS — H401231 Low-tension glaucoma, bilateral, mild stage: Secondary | ICD-10-CM | POA: Diagnosis not present

## 2023-02-17 ENCOUNTER — Other Ambulatory Visit: Payer: Self-pay | Admitting: Internal Medicine

## 2023-02-17 DIAGNOSIS — R1013 Epigastric pain: Secondary | ICD-10-CM

## 2023-02-21 ENCOUNTER — Ambulatory Visit: Payer: Medicare Other | Admitting: Internal Medicine

## 2023-02-22 ENCOUNTER — Encounter: Payer: Self-pay | Admitting: Internal Medicine

## 2023-02-25 ENCOUNTER — Encounter: Payer: Self-pay | Admitting: Internal Medicine

## 2023-02-25 DIAGNOSIS — K219 Gastro-esophageal reflux disease without esophagitis: Secondary | ICD-10-CM | POA: Insufficient documentation

## 2023-02-25 NOTE — Patient Instructions (Addendum)
      Blood work was ordered.   Your A1c is 7.9% - our goal is less than 7%    Medications changes include :   None      Return in about 5 months (around 07/27/2023) for follow up.

## 2023-02-25 NOTE — Progress Notes (Unsigned)
Subjective:    Patient ID: Donald Carlson, male    DOB: 1946/03/14, 77 y.o.   MRN: [id number]     HPI Donald Carlson is here for follow up of his chronic medical problems.  Sugars are variable -  sugar now is 169.  Lowest sugar 73 - had not eaten for a while.  He was working at his brothers house working.  Highest sugar was almost 300.  He is following with Dr. Talmage Carlson and she does have him on Humalog.  Medications and allergies reviewed with patient and updated if appropriate.  Current Outpatient Medications on File Prior to Visit  Medication Sig Dispense Refill   aspirin 81 MG tablet Take 1 tablet (81 mg total) by mouth 2 (two) times daily after a meal. (Patient taking differently: Take 81 mg by mouth daily.) 60 tablet 1   carvedilol (COREG) 25 MG tablet TAKE 1 TABLET BY MOUTH TWICE DAILY WITH MEALS 180 tablet 0   cholecalciferol (VITAMIN D) 1000 units tablet Take 1,000 Units by mouth daily.     Continuous Glucose Receiver (DEXCOM G7 RECEIVER) DEVI UAD to check sugars.  IDDM 1 each 0   Continuous Glucose Sensor (DEXCOM G7 SENSOR) MISC UAD to check sugars.  IDDM 3 each 5   docusate sodium (COLACE) 100 MG capsule Take 1 capsule (100 mg total) by mouth 2 (two) times daily. (Patient taking differently: Take 100 mg by mouth every other day.) 60 capsule 3   empagliflozin (JARDIANCE) 25 MG TABS tablet Take 1 tablet (25 mg total) by mouth daily before breakfast. 90 tablet 1   Flaxseed, Linseed, (FLAX SEED OIL) 1000 MG CAPS Take 1,000 mg by mouth daily.     gabapentin (NEURONTIN) 300 MG capsule TAKE 1 CAPSULE(300 MG) BY MOUTH THREE TIMES DAILY 90 capsule 3   glucose blood (ONE TOUCH ULTRA TEST) test strip Use to check blood sugars once a day and prn. Dx e11.9 100 each 1   insulin glargine (LANTUS SOLOSTAR) 100 UNIT/ML Solostar Pen Inject 26 Units into the skin daily. 15 mL 2   Insulin Pen Needle 32G X 6 MM MISC UAD for saxenda injections 30 each 11   Lancets (ONETOUCH ULTRASOFT) lancets Use as  instructed to check sugar daily 100 each 12   latanoprost (XALATAN) 0.005 % ophthalmic solution Place 1 drop into both eyes at bedtime.     lisinopril (ZESTRIL) 20 MG tablet TAKE 1 TABLET(20 MG) BY MOUTH DAILY 90 tablet 3   Multiple Vitamins-Iron (MULTIVITAMINS WITH IRON) TABS tablet Take 1 tablet by mouth daily. 30 tablet 0   pantoprazole (PROTONIX) 40 MG tablet TAKE 1 TABLET(40 MG) BY MOUTH TWICE DAILY 90 tablet 1   promethazine (PHENERGAN) 12.5 MG tablet Take 1 tablet (12.5 mg total) by mouth every 8 (eight) hours as needed for nausea or vomiting. 60 tablet 5   rosuvastatin (CRESTOR) 20 MG tablet TAKE 1 TABLET(20 MG) BY MOUTH DAILY 90 tablet 1   Saw Palmetto, Serenoa repens, (SAW PALMETTO PO) Take 1 tablet by mouth every morning.     vitamin B-12 (CYANOCOBALAMIN) 1000 MCG tablet Take 1,000 mcg by mouth daily.     No current facility-administered medications on file prior to visit.     Review of Systems  Constitutional:  Negative for fever.  Respiratory:  Negative for cough, shortness of breath and wheezing.   Cardiovascular:  Positive for leg swelling (during day). Negative for chest pain and palpitations.  Neurological:  Negative for light-headedness and  headaches.       Objective:   Vitals:   02/26/23 1046  BP: 128/74  Pulse: (!) 58  Temp: 98.2 F (36.8 C)  SpO2: 96%   BP Readings from Last 3 Encounters:  02/26/23 128/74  11/22/22 110/76  09/28/22 (!) 160/81   Wt Readings from Last 3 Encounters:  02/26/23 189 lb (85.7 kg)  11/22/22 182 lb (82.6 kg)  09/28/22 176 lb (79.8 kg)   Body mass index is 28.74 kg/m.    Physical Exam Constitutional:      General: He is not in acute distress.    Appearance: Normal appearance. He is not ill-appearing.  HENT:     Head: Normocephalic and atraumatic.  Eyes:     Conjunctiva/sclera: Conjunctivae normal.  Cardiovascular:     Rate and Rhythm: Normal rate and regular rhythm.     Heart sounds: Normal heart sounds.  Pulmonary:      Effort: Pulmonary effort is normal. No respiratory distress.     Breath sounds: Normal breath sounds. No wheezing or rales.  Musculoskeletal:     Right lower leg: No edema.     Left lower leg: No edema.  Skin:    General: Skin is warm and dry.     Findings: No rash.  Neurological:     Mental Status: He is alert. Mental status is at baseline.  Psychiatric:        Mood and Affect: Mood normal.        Lab Results  Component Value Date   WBC 7.7 11/22/2022   HGB 12.0 (L) 11/22/2022   HCT 36.6 (L) 11/22/2022   PLT 176.0 11/22/2022   GLUCOSE 206 (H) 11/22/2022   CHOL 102 11/22/2022   TRIG 169.0 (H) 11/22/2022   HDL 30.50 (L) 11/22/2022   LDLDIRECT 116.7 02/03/2014   LDLCALC 38 11/22/2022   ALT 15 11/22/2022   AST 16 11/22/2022   NA 140 11/22/2022   K 4.9 11/22/2022   CL 109 11/22/2022   CREATININE 1.94 (H) 11/22/2022   BUN 35 (H) 11/22/2022   CO2 26 11/22/2022   TSH 0.78 05/06/2020   PSA 0.37 10/23/2007   INR 0.96 01/23/2013   HGBA1C 9.5 (H) 11/22/2022   MICROALBUR 13.7 (H) 05/24/2022     Assessment & Plan:    See Problem List for Assessment and Plan of chronic medical problems.

## 2023-02-26 ENCOUNTER — Ambulatory Visit (INDEPENDENT_AMBULATORY_CARE_PROVIDER_SITE_OTHER): Payer: Medicare Other | Admitting: Internal Medicine

## 2023-02-26 VITALS — BP 128/74 | HR 58 | Temp 98.2°F | Ht 68.0 in | Wt 189.0 lb

## 2023-02-26 DIAGNOSIS — E875 Hyperkalemia: Secondary | ICD-10-CM

## 2023-02-26 DIAGNOSIS — E538 Deficiency of other specified B group vitamins: Secondary | ICD-10-CM

## 2023-02-26 DIAGNOSIS — E1142 Type 2 diabetes mellitus with diabetic polyneuropathy: Secondary | ICD-10-CM | POA: Diagnosis not present

## 2023-02-26 DIAGNOSIS — D649 Anemia, unspecified: Secondary | ICD-10-CM

## 2023-02-26 DIAGNOSIS — K219 Gastro-esophageal reflux disease without esophagitis: Secondary | ICD-10-CM

## 2023-02-26 DIAGNOSIS — N1831 Chronic kidney disease, stage 3a: Secondary | ICD-10-CM | POA: Diagnosis not present

## 2023-02-26 DIAGNOSIS — I10 Essential (primary) hypertension: Secondary | ICD-10-CM

## 2023-02-26 DIAGNOSIS — E782 Mixed hyperlipidemia: Secondary | ICD-10-CM | POA: Diagnosis not present

## 2023-02-26 LAB — CBC WITH DIFFERENTIAL/PLATELET
Basophils Absolute: 0.1 10*3/uL (ref 0.0–0.1)
Basophils Relative: 0.7 % (ref 0.0–3.0)
Eosinophils Absolute: 0.4 10*3/uL (ref 0.0–0.7)
Eosinophils Relative: 5.6 % — ABNORMAL HIGH (ref 0.0–5.0)
HCT: 39.4 % (ref 39.0–52.0)
Hemoglobin: 12.5 g/dL — ABNORMAL LOW (ref 13.0–17.0)
Lymphocytes Relative: 24.2 % (ref 12.0–46.0)
Lymphs Abs: 1.9 10*3/uL (ref 0.7–4.0)
MCHC: 31.7 g/dL (ref 30.0–36.0)
MCV: 84.6 fL (ref 78.0–100.0)
Monocytes Absolute: 0.6 10*3/uL (ref 0.1–1.0)
Monocytes Relative: 7.9 % (ref 3.0–12.0)
Neutro Abs: 4.9 10*3/uL (ref 1.4–7.7)
Neutrophils Relative %: 61.6 % (ref 43.0–77.0)
Platelets: 198 10*3/uL (ref 150.0–400.0)
RBC: 4.66 Mil/uL (ref 4.22–5.81)
RDW: 14.5 % (ref 11.5–15.5)
WBC: 8 10*3/uL (ref 4.0–10.5)

## 2023-02-26 LAB — COMPREHENSIVE METABOLIC PANEL
ALT: 16 U/L (ref 0–53)
AST: 18 U/L (ref 0–37)
Albumin: 4.2 g/dL (ref 3.5–5.2)
Alkaline Phosphatase: 64 U/L (ref 39–117)
BUN: 30 mg/dL — ABNORMAL HIGH (ref 6–23)
CO2: 27 meq/L (ref 19–32)
Calcium: 9.8 mg/dL (ref 8.4–10.5)
Chloride: 106 meq/L (ref 96–112)
Creatinine, Ser: 1.63 mg/dL — ABNORMAL HIGH (ref 0.40–1.50)
GFR: 40.23 mL/min — ABNORMAL LOW (ref >60.00)
Glucose, Bld: 144 mg/dL — ABNORMAL HIGH (ref 70–99)
Potassium: 5.4 meq/L — ABNORMAL HIGH (ref 3.5–5.1)
Sodium: 140 meq/L (ref 135–145)
Total Bilirubin: 0.5 mg/dL (ref 0.2–1.2)
Total Protein: 7.2 g/dL (ref 6.0–8.3)

## 2023-02-26 LAB — VITAMIN B12: Vitamin B-12: 1042 pg/mL — ABNORMAL HIGH (ref 211–911)

## 2023-02-26 LAB — VITAMIN D 25 HYDROXY (VIT D DEFICIENCY, FRACTURES): VITD: 37.04 ng/mL (ref 30.00–100.00)

## 2023-02-26 LAB — POCT GLYCOSYLATED HEMOGLOBIN (HGB A1C)
HbA1c POC (<> result, manual entry): 7.9 % (ref 4.0–5.6)
HbA1c, POC (controlled diabetic range): 7.9 % — AB (ref 0.0–7.0)
HbA1c, POC (prediabetic range): 7.9 % — AB (ref 5.7–6.4)
Hemoglobin A1C: 7.9 % — AB (ref 4.0–5.6)

## 2023-02-26 LAB — IBC PANEL
Iron: 51 ug/dL (ref 42–165)
Saturation Ratios: 11.9 % — ABNORMAL LOW (ref 20.0–50.0)
TIBC: 427 ug/dL (ref 250.0–450.0)
Transferrin: 305 mg/dL (ref 212.0–360.0)

## 2023-02-26 LAB — FERRITIN: Ferritin: 11.5 ng/mL — ABNORMAL LOW (ref 22.0–322.0)

## 2023-02-26 NOTE — Assessment & Plan Note (Addendum)
Chronic ?Blood pressure well controlled ?CMP ?Continue Coreg 25 mg twice daily, lisinopril 20 mg daily ?

## 2023-02-26 NOTE — Assessment & Plan Note (Signed)
Continue B12 daily Check level

## 2023-02-26 NOTE — Assessment & Plan Note (Signed)
Chronic Regular exercise and healthy diet encouraged Check lipid panel  Continue Crestor 20 mg daily 

## 2023-02-26 NOTE — Assessment & Plan Note (Signed)
Chronic Mild CBC, iron panel

## 2023-02-26 NOTE — Assessment & Plan Note (Signed)
Chronic GERD controlled Continue protonix 40 mgtwice daily 

## 2023-02-26 NOTE — Assessment & Plan Note (Addendum)
Chronic  Lab Results  Component Value Date   HGBA1C 9.5 (H) 11/22/2022   Sugars not ideally controlled A1c today 7.9%-improved, but still not at goal Following with endocrine-Dr. Talmage Nap Continue Jardiance 25 mg daily, lantus insulin 26 units daily, on Humalog Has continuous glucose monitor and can warn him if sugars are high/low Stressed regular exercise, diabetic diet

## 2023-02-26 NOTE — Assessment & Plan Note (Addendum)
Chronic Mild Stressed good blood pressure and sugar control Diet low in sodium Drink plenty of water No NSAIDs Cmp, cbc Saw nephrology-decreased GFR thought to be related to Jardiance, ACE inhibitor

## 2023-02-27 NOTE — Telephone Encounter (Signed)
Rec'd completed provider pages.

## 2023-02-27 NOTE — Telephone Encounter (Signed)
Submitted application for [employer] to [employer]) for patient assistance.   Phone: [phone]  Application submitted close to end of year, may be used for 2025 enrollment. Will see what company decides.

## 2023-02-27 NOTE — Addendum Note (Signed)
Addended by: Pincus Sanes on: 02/27/2023 06:04 AM   Modules accepted: Orders

## 2023-03-02 ENCOUNTER — Other Ambulatory Visit (INDEPENDENT_AMBULATORY_CARE_PROVIDER_SITE_OTHER): Payer: Medicare Other

## 2023-03-02 ENCOUNTER — Ambulatory Visit (INDEPENDENT_AMBULATORY_CARE_PROVIDER_SITE_OTHER): Payer: Medicare Other

## 2023-03-02 DIAGNOSIS — E875 Hyperkalemia: Secondary | ICD-10-CM | POA: Diagnosis not present

## 2023-03-02 DIAGNOSIS — Z0000 Encounter for general adult medical examination without abnormal findings: Secondary | ICD-10-CM | POA: Diagnosis not present

## 2023-03-02 LAB — BASIC METABOLIC PANEL
BUN: 30 mg/dL — ABNORMAL HIGH (ref 6–23)
CO2: 28 meq/L (ref 19–32)
Calcium: 9.4 mg/dL (ref 8.4–10.5)
Chloride: 105 meq/L (ref 96–112)
Creatinine, Ser: 1.84 mg/dL — ABNORMAL HIGH (ref 0.40–1.50)
GFR: 34.78 mL/min — ABNORMAL LOW (ref >60.00)
Glucose, Bld: 177 mg/dL — ABNORMAL HIGH (ref 70–99)
Potassium: 4.4 meq/L (ref 3.5–5.1)
Sodium: 142 meq/L (ref 135–145)

## 2023-03-02 NOTE — Patient Instructions (Addendum)
Donald Carlson , Thank you for taking time to come for your Medicare Wellness Visit. I appreciate your ongoing commitment to your health goals. Please review the following plan we discussed and let me know if I can assist you in the future.   Referrals/Orders/Follow-Ups/Clinician Recommendations: NONE  This is a list of the screening recommended for you and due dates:  Health Maintenance  Topic Date Due   Zoster (Shingles) Vaccine (1 of 2) 01/22/1995   Eye exam for diabetics  07/08/2022   COVID-19 Vaccine (4 - 2024-25 season) 11/19/2022   Flu Shot  06/18/2023*   Complete foot exam   04/07/2023   Hemoglobin A1C  08/27/2023   Yearly kidney health urinalysis for diabetes  11/09/2023   Yearly kidney function blood test for diabetes  02/26/2024   Medicare Annual Wellness Visit  03/01/2024   DTaP/Tdap/Td vaccine (3 - Td or Tdap) 09/27/2032   Pneumonia Vaccine  Completed   Hepatitis C Screening  Completed   HPV Vaccine  Aged Out   Colon Cancer Screening  Discontinued  *Topic was postponed. The date shown is not the original due date.    Advanced directives: (ACP Link)Information on Advanced Care Planning can be found at [facility] of [place] Advance Health Care Directives Advance Health Care Directives ([email or web address]   Next Medicare Annual Wellness Visit scheduled for next year: Yes   03/07/24 @ 12:40 PM IN PERSON
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
## 2023-03-09 DIAGNOSIS — I129 Hypertensive chronic kidney disease with stage 1 through stage 4 chronic kidney disease, or unspecified chronic kidney disease: Secondary | ICD-10-CM | POA: Diagnosis not present

## 2023-03-09 DIAGNOSIS — E1122 Type 2 diabetes mellitus with diabetic chronic kidney disease: Secondary | ICD-10-CM | POA: Diagnosis not present

## 2023-03-09 DIAGNOSIS — N1832 Chronic kidney disease, stage 3b: Secondary | ICD-10-CM | POA: Diagnosis not present

## 2023-03-09 DIAGNOSIS — D631 Anemia in chronic kidney disease: Secondary | ICD-10-CM | POA: Diagnosis not present

## 2023-03-12 ENCOUNTER — Telehealth: Payer: Self-pay

## 2023-03-12 NOTE — Telephone Encounter (Signed)
PAP: Patient assistance application for [place] has been approved by PAP Companies: [employer] from 03/21/2023 to 03/19/2024. Medication should be delivered to PAP Delivery: Home For further shipping updates, please contact [employer] ([employer]) at [phone] Pt ID is: [id number]  PLEASE BE ADVISED LETTER OF APPROVAL HAS BEEN SCANNED IN MEDIA OF CHART

## 2023-03-12 NOTE — Telephone Encounter (Signed)
Patient notified via mychart

## 2023-03-12 NOTE — Progress Notes (Signed)
Pharmacy Medication Assistance Program Note    03/12/2023  Patient ID: Donald Carlson, male   DOB: Sep 03, 1945, 77 y.o.   MRN: [id number]     03/12/2023  Outreach Medication One  Initial Outreach Date (Medication One) 03/12/2023  Manufacturer Medication One [employer]  [employer] Drugs Jardiance  Dose of Jardiance 25MG   Type of Sport and exercise psychologist  Patient Assistance Determination Approved  Approval Start Date 03/21/2023  Approval End Date 03/19/2024     Signature

## 2023-03-28 NOTE — Telephone Encounter (Signed)
 Approved.   Updates on 03/12/23 telephone note.

## 2023-04-06 DIAGNOSIS — H2513 Age-related nuclear cataract, bilateral: Secondary | ICD-10-CM | POA: Diagnosis not present

## 2023-04-08 ENCOUNTER — Other Ambulatory Visit: Payer: Self-pay | Admitting: Internal Medicine

## 2023-04-10 ENCOUNTER — Encounter: Payer: Self-pay | Admitting: Internal Medicine

## 2023-04-20 ENCOUNTER — Other Ambulatory Visit: Payer: Self-pay | Admitting: Internal Medicine

## 2023-05-15 ENCOUNTER — Other Ambulatory Visit: Payer: Self-pay | Admitting: Internal Medicine

## 2023-05-15 DIAGNOSIS — R1013 Epigastric pain: Secondary | ICD-10-CM

## 2023-05-20 ENCOUNTER — Other Ambulatory Visit: Payer: Self-pay | Admitting: Internal Medicine

## 2023-05-20 ENCOUNTER — Other Ambulatory Visit: Payer: Self-pay | Admitting: Podiatry

## 2023-05-25 DIAGNOSIS — E1142 Type 2 diabetes mellitus with diabetic polyneuropathy: Secondary | ICD-10-CM | POA: Diagnosis not present

## 2023-05-25 DIAGNOSIS — E782 Mixed hyperlipidemia: Secondary | ICD-10-CM | POA: Diagnosis not present

## 2023-05-25 DIAGNOSIS — I10 Essential (primary) hypertension: Secondary | ICD-10-CM | POA: Diagnosis not present

## 2023-05-25 DIAGNOSIS — N1831 Chronic kidney disease, stage 3a: Secondary | ICD-10-CM | POA: Diagnosis not present

## 2023-05-29 DIAGNOSIS — H25811 Combined forms of age-related cataract, right eye: Secondary | ICD-10-CM | POA: Diagnosis not present

## 2023-05-29 DIAGNOSIS — H2511 Age-related nuclear cataract, right eye: Secondary | ICD-10-CM | POA: Diagnosis not present

## 2023-06-12 DIAGNOSIS — H25812 Combined forms of age-related cataract, left eye: Secondary | ICD-10-CM | POA: Diagnosis not present

## 2023-06-12 DIAGNOSIS — H2512 Age-related nuclear cataract, left eye: Secondary | ICD-10-CM | POA: Diagnosis not present

## 2023-06-17 ENCOUNTER — Other Ambulatory Visit: Payer: Self-pay | Admitting: Internal Medicine

## 2023-06-29 ENCOUNTER — Other Ambulatory Visit: Payer: Self-pay | Admitting: Podiatry

## 2023-07-09 DIAGNOSIS — I129 Hypertensive chronic kidney disease with stage 1 through stage 4 chronic kidney disease, or unspecified chronic kidney disease: Secondary | ICD-10-CM | POA: Diagnosis not present

## 2023-07-09 DIAGNOSIS — N1832 Chronic kidney disease, stage 3b: Secondary | ICD-10-CM | POA: Diagnosis not present

## 2023-07-09 DIAGNOSIS — E1122 Type 2 diabetes mellitus with diabetic chronic kidney disease: Secondary | ICD-10-CM | POA: Diagnosis not present

## 2023-07-09 DIAGNOSIS — D631 Anemia in chronic kidney disease: Secondary | ICD-10-CM | POA: Diagnosis not present

## 2023-07-09 DIAGNOSIS — N189 Chronic kidney disease, unspecified: Secondary | ICD-10-CM | POA: Diagnosis not present

## 2023-07-26 ENCOUNTER — Encounter: Payer: Self-pay | Admitting: Internal Medicine

## 2023-07-26 NOTE — Patient Instructions (Addendum)
      Blood work was ordered.       Medications changes include :   None     Return in about 6 months (around 01/27/2024) for Physical Exam.

## 2023-07-26 NOTE — Progress Notes (Signed)
 Subjective:    Patient ID: Donald Carlson, male    DOB: 06/13/1945, 78 y.o.   MRN: [id number]     HPI Donald Carlson is here for follow up of his chronic medical problems.  S/p cataract removal.    Doing ok.   Medications and allergies reviewed with patient and updated if appropriate.  Current Outpatient Medications on File Prior to Visit  Medication Sig Dispense Refill   aspirin  81 MG tablet Take 1 tablet (81 mg total) by mouth 2 (two) times daily after a meal. (Patient taking differently: Take 81 mg by mouth daily.) 60 tablet 1   carvedilol  (COREG ) 25 MG tablet TAKE 1 TABLET BY MOUTH TWICE DAILY WITH MEALS 180 tablet 0   cholecalciferol  (VITAMIN D ) 1000 units tablet Take 1,000 Units by mouth daily.     Continuous Glucose Receiver (DEXCOM G7 RECEIVER) DEVI UAD to check sugars.  IDDM 1 each 0   Continuous Glucose Sensor (DEXCOM G7 SENSOR) MISC USE AS DIRECTED TO CHECK BLOOD SUGAR 3 each 5   docusate sodium  (COLACE) 100 MG capsule Take 1 capsule (100 mg total) by mouth 2 (two) times daily. (Patient taking differently: Take 100 mg by mouth every other day.) 60 capsule 3   empagliflozin  (JARDIANCE ) 25 MG TABS tablet Take 1 tablet (25 mg total) by mouth daily before breakfast. 90 tablet 1   Flaxseed, Linseed, (FLAX SEED OIL) 1000 MG CAPS Take 1,000 mg by mouth daily.     gabapentin  (NEURONTIN ) 300 MG capsule TAKE 1 CAPSULE(300 MG) BY MOUTH THREE TIMES DAILY 90 capsule 3   glucose blood (ONE TOUCH ULTRA TEST) test strip Use to check blood sugars once a day and prn. Dx e11.9 100 each 1   Insulin  Pen Needle 32G X 6 MM MISC UAD for saxenda injections 30 each 11   Lancets (ONETOUCH ULTRASOFT) lancets Use as instructed to check sugar daily 100 each 12   LANTUS  SOLOSTAR 100 UNIT/ML Solostar Pen ADMINISTER 26 UNITS UNDER THE SKIN DAILY 15 mL 2   latanoprost (XALATAN) 0.005 % ophthalmic solution Place 1 drop into both eyes at bedtime.     lisinopril  (ZESTRIL ) 20 MG tablet TAKE 1 TABLET(20 MG)  BY MOUTH DAILY 90 tablet 3   Multiple Vitamins-Iron  (MULTIVITAMINS WITH IRON ) TABS tablet Take 1 tablet by mouth daily. 30 tablet 0   pantoprazole  (PROTONIX ) 40 MG tablet TAKE 1 TABLET(40 MG) BY MOUTH TWICE DAILY 90 tablet 1   promethazine  (PHENERGAN ) 12.5 MG tablet Take 1 tablet (12.5 mg total) by mouth every 8 (eight) hours as needed for nausea or vomiting. 60 tablet 5   rosuvastatin  (CRESTOR ) 20 MG tablet TAKE 1 TABLET(20 MG) BY MOUTH DAILY 90 tablet 1   Saw Palmetto , Serenoa repens, (SAW PALMETTO  PO) Take 1 tablet by mouth every morning.     vitamin B-12 (CYANOCOBALAMIN ) 1000 MCG tablet Take 1,000 mcg by mouth daily.     No current facility-administered medications on file prior to visit.     Review of Systems  Constitutional:  Negative for fever.  Respiratory:  Negative for cough, shortness of breath and wheezing.   Cardiovascular:  Positive for leg swelling (in evening). Negative for chest pain and palpitations.  Neurological:  Negative for light-headedness and headaches.       Objective:   Vitals:   07/27/23 1301  BP: (!) 142/90  Pulse: (!) 55  Temp: 98 F (36.7 C)  SpO2: 97%   BP Readings from Last 3 Encounters:  07/27/23 (!) 142/90  02/26/23 128/74  11/22/22 110/76   Wt Readings from Last 3 Encounters:  07/27/23 187 lb (84.8 kg)  02/26/23 189 lb (85.7 kg)  11/22/22 182 lb (82.6 kg)   Body mass index is 28.43 kg/m.    Physical Exam Constitutional:      General: He is not in acute distress.    Appearance: Normal appearance. He is not ill-appearing.  HENT:     Head: Normocephalic and atraumatic.  Eyes:     Conjunctiva/sclera: Conjunctivae normal.  Cardiovascular:     Rate and Rhythm: Normal rate and regular rhythm.     Heart sounds: Normal heart sounds.  Pulmonary:     Effort: Pulmonary effort is normal. No respiratory distress.     Breath sounds: Normal breath sounds. No wheezing or rales.  Musculoskeletal:     Right lower leg: No edema.     Left  lower leg: No edema.  Skin:    General: Skin is warm and dry.     Findings: No rash.  Neurological:     Mental Status: He is alert. Mental status is at baseline.  Psychiatric:        Mood and Affect: Mood normal.       Diabetic Foot Exam - Simple   Simple Foot Form Diabetic Foot exam was performed with the following findings: Yes 07/27/2023  1:23 PM  Visual Inspection No deformities, no ulcerations, no other skin breakdown bilaterally: Yes Sensation Testing See comments: Yes Pulse Check Posterior Tibialis and Dorsalis pulse intact bilaterally: Yes Comments Slightly decreased sensation in b/l feet - left > right      Lab Results  Component Value Date   WBC 8.0 02/26/2023   HGB 12.5 (L) 02/26/2023   HCT 39.4 02/26/2023   PLT 198.0 02/26/2023   GLUCOSE 177 (H) 03/02/2023   CHOL 102 11/22/2022   TRIG 169.0 (H) 11/22/2022   HDL 30.50 (L) 11/22/2022   LDLDIRECT 116.7 02/03/2014   LDLCALC 38 11/22/2022   ALT 16 02/26/2023   AST 18 02/26/2023   NA 142 03/02/2023   K 4.4 03/02/2023   CL 105 03/02/2023   CREATININE 1.84 (H) 03/02/2023   BUN 30 (H) 03/02/2023   CO2 28 03/02/2023   TSH 0.78 05/06/2020   PSA 0.37 10/23/2007   INR 0.96 01/23/2013   HGBA1C 7.9 (A) 02/26/2023   HGBA1C 7.9 02/26/2023   HGBA1C 7.9 (A) 02/26/2023   HGBA1C 7.9 (A) 02/26/2023   MICROALBUR 13.7 (H) 05/24/2022     Assessment & Plan:    See Problem List for Assessment and Plan of chronic medical problems.

## 2023-07-27 ENCOUNTER — Ambulatory Visit (INDEPENDENT_AMBULATORY_CARE_PROVIDER_SITE_OTHER): Payer: Medicare Other | Admitting: Internal Medicine

## 2023-07-27 VITALS — BP 132/74 | HR 55 | Temp 98.0°F | Ht 68.0 in | Wt 187.0 lb

## 2023-07-27 DIAGNOSIS — E1142 Type 2 diabetes mellitus with diabetic polyneuropathy: Secondary | ICD-10-CM

## 2023-07-27 DIAGNOSIS — N1832 Chronic kidney disease, stage 3b: Secondary | ICD-10-CM | POA: Diagnosis not present

## 2023-07-27 DIAGNOSIS — E538 Deficiency of other specified B group vitamins: Secondary | ICD-10-CM

## 2023-07-27 DIAGNOSIS — Z7984 Long term (current) use of oral hypoglycemic drugs: Secondary | ICD-10-CM

## 2023-07-27 DIAGNOSIS — N1831 Chronic kidney disease, stage 3a: Secondary | ICD-10-CM | POA: Diagnosis not present

## 2023-07-27 DIAGNOSIS — E782 Mixed hyperlipidemia: Secondary | ICD-10-CM | POA: Diagnosis not present

## 2023-07-27 DIAGNOSIS — K219 Gastro-esophageal reflux disease without esophagitis: Secondary | ICD-10-CM

## 2023-07-27 DIAGNOSIS — I10 Essential (primary) hypertension: Secondary | ICD-10-CM

## 2023-07-27 LAB — LIPID PANEL
Cholesterol: 108 mg/dL (ref 0–200)
HDL: 37.3 mg/dL — ABNORMAL LOW (ref >39.00)
LDL Cholesterol: 52 mg/dL (ref 0–99)
NonHDL: 70.68
Total CHOL/HDL Ratio: 3
Triglycerides: 95 mg/dL (ref 0.0–149.0)
VLDL: 19 mg/dL (ref 0.0–40.0)

## 2023-07-27 LAB — CBC WITH DIFFERENTIAL/PLATELET
Basophils Absolute: 0 10*3/uL (ref 0.0–0.1)
Basophils Relative: 0.7 % (ref 0.0–3.0)
Eosinophils Absolute: 0.4 10*3/uL (ref 0.0–0.7)
Eosinophils Relative: 5.4 % — ABNORMAL HIGH (ref 0.0–5.0)
HCT: 39.5 % (ref 39.0–52.0)
Hemoglobin: 12.7 g/dL — ABNORMAL LOW (ref 13.0–17.0)
Lymphocytes Relative: 27 % (ref 12.0–46.0)
Lymphs Abs: 1.9 10*3/uL (ref 0.7–4.0)
MCHC: 32.1 g/dL (ref 30.0–36.0)
MCV: 84 fl (ref 78.0–100.0)
Monocytes Absolute: 0.6 10*3/uL (ref 0.1–1.0)
Monocytes Relative: 8.7 % (ref 3.0–12.0)
Neutro Abs: 4.2 10*3/uL (ref 1.4–7.7)
Neutrophils Relative %: 58.2 % (ref 43.0–77.0)
Platelets: 179 10*3/uL (ref 150.0–400.0)
RBC: 4.7 Mil/uL (ref 4.22–5.81)
RDW: 15.4 % (ref 11.5–15.5)
WBC: 7.2 10*3/uL (ref 4.0–10.5)

## 2023-07-27 LAB — COMPREHENSIVE METABOLIC PANEL WITH GFR
ALT: 15 U/L (ref 0–53)
AST: 20 U/L (ref 0–37)
Albumin: 4.4 g/dL (ref 3.5–5.2)
Alkaline Phosphatase: 67 U/L (ref 39–117)
BUN: 25 mg/dL — ABNORMAL HIGH (ref 6–23)
CO2: 27 meq/L (ref 19–32)
Calcium: 9.3 mg/dL (ref 8.4–10.5)
Chloride: 107 meq/L (ref 96–112)
Creatinine, Ser: 1.72 mg/dL — ABNORMAL HIGH (ref 0.40–1.50)
GFR: 37.6 mL/min — ABNORMAL LOW (ref >60.00)
Glucose, Bld: 103 mg/dL — ABNORMAL HIGH (ref 70–99)
Potassium: 4.4 meq/L (ref 3.5–5.1)
Sodium: 141 meq/L (ref 135–145)
Total Bilirubin: 0.4 mg/dL (ref 0.2–1.2)
Total Protein: 7.3 g/dL (ref 6.0–8.3)

## 2023-07-27 LAB — HEMOGLOBIN A1C: Hgb A1c MFr Bld: 7.9 % — ABNORMAL HIGH (ref 4.6–6.5)

## 2023-07-27 MED ORDER — LANTUS SOLOSTAR 100 UNIT/ML ~~LOC~~ SOPN: PEN_INJECTOR | SUBCUTANEOUS | Status: DC

## 2023-07-27 NOTE — Assessment & Plan Note (Signed)
 Chronic Mild Stressed good blood pressure and sugar control Diet low in sodium Drink plenty of water No NSAIDs Cmp, cbc Saw nephrology-decreased GFR thought to be related to Jardiance, ACE inhibitor

## 2023-07-27 NOTE — Assessment & Plan Note (Signed)
 Chronic  Lab Results  Component Value Date   HGBA1C 7.9 (A) 02/26/2023   HGBA1C 7.9 02/26/2023   HGBA1C 7.9 (A) 02/26/2023   HGBA1C 7.9 (A) 02/26/2023   Sugars not ideally controlled A1c today 7.9% Following with endocrine-Dr. Balan Continue Jardiance  25 mg daily, lantus  insulin  26 units daily, on Humalog Has continuous glucose monitor  Stressed regular exercise, diabetic diet

## 2023-07-27 NOTE — Assessment & Plan Note (Signed)
 Chronic Sees podiatry  Taking neurontin  300 mg tid Stressed better sugar control

## 2023-07-27 NOTE — Assessment & Plan Note (Signed)
Chronic Regular exercise and healthy diet encouraged Check lipid panel  Continue Crestor 20 mg daily 

## 2023-07-27 NOTE — Assessment & Plan Note (Signed)
 Chronic Blood pressure well controlled  CMP, CBC Continue Coreg  25 mg twice daily, lisinopril  20 mg daily

## 2023-07-27 NOTE — Assessment & Plan Note (Signed)
Continue B12 daily

## 2023-07-27 NOTE — Assessment & Plan Note (Signed)
Chronic GERD controlled Continue protonix 40 mgtwice daily 

## 2023-07-28 ENCOUNTER — Encounter: Payer: Self-pay | Admitting: Internal Medicine

## 2023-08-13 ENCOUNTER — Other Ambulatory Visit: Payer: Self-pay | Admitting: Internal Medicine

## 2023-08-13 DIAGNOSIS — R1013 Epigastric pain: Secondary | ICD-10-CM

## 2023-09-24 ENCOUNTER — Other Ambulatory Visit: Payer: Self-pay | Admitting: Internal Medicine

## 2023-09-24 DIAGNOSIS — R1013 Epigastric pain: Secondary | ICD-10-CM

## 2023-09-29 ENCOUNTER — Other Ambulatory Visit: Payer: Self-pay | Admitting: Internal Medicine

## 2023-10-05 ENCOUNTER — Other Ambulatory Visit: Payer: Self-pay | Admitting: Internal Medicine

## 2023-10-17 ENCOUNTER — Telehealth: Payer: Self-pay | Admitting: Pharmacist

## 2023-10-17 ENCOUNTER — Other Ambulatory Visit: Payer: Self-pay | Admitting: Internal Medicine

## 2023-10-17 NOTE — Progress Notes (Signed)
 Pharmacy Quality Measure Review  This patient is appearing on a report for being at risk of failing the adherence measure for hypertension (ACEi/ARB) medications this calendar year.   Medication: Lisinopril  20 mg Last fill date: 08/18/23 for 90 day supply  Insurance report was not up to date. No action needed at this time.   Darrelyn Drum, PharmD, BCPS, CPP Clinical Pharmacist Practitioner [facility] [phone]

## 2023-10-21 ENCOUNTER — Other Ambulatory Visit: Payer: Self-pay | Admitting: Podiatry

## 2023-10-21 ENCOUNTER — Other Ambulatory Visit: Payer: Self-pay | Admitting: Internal Medicine

## 2023-11-09 ENCOUNTER — Other Ambulatory Visit: Payer: Self-pay | Admitting: Internal Medicine

## 2023-11-12 DIAGNOSIS — D631 Anemia in chronic kidney disease: Secondary | ICD-10-CM | POA: Diagnosis not present

## 2023-11-12 DIAGNOSIS — N1832 Chronic kidney disease, stage 3b: Secondary | ICD-10-CM | POA: Diagnosis not present

## 2023-11-12 DIAGNOSIS — I129 Hypertensive chronic kidney disease with stage 1 through stage 4 chronic kidney disease, or unspecified chronic kidney disease: Secondary | ICD-10-CM | POA: Diagnosis not present

## 2023-11-12 DIAGNOSIS — E1122 Type 2 diabetes mellitus with diabetic chronic kidney disease: Secondary | ICD-10-CM | POA: Diagnosis not present

## 2023-11-12 DIAGNOSIS — N189 Chronic kidney disease, unspecified: Secondary | ICD-10-CM | POA: Diagnosis not present

## 2023-11-12 LAB — LAB REPORT - SCANNED: EGFR: 44

## 2023-11-13 ENCOUNTER — Encounter: Payer: Self-pay | Admitting: Nephrology

## 2023-11-26 DIAGNOSIS — E782 Mixed hyperlipidemia: Secondary | ICD-10-CM | POA: Diagnosis not present

## 2023-11-26 DIAGNOSIS — E1142 Type 2 diabetes mellitus with diabetic polyneuropathy: Secondary | ICD-10-CM | POA: Diagnosis not present

## 2023-11-26 DIAGNOSIS — N1831 Chronic kidney disease, stage 3a: Secondary | ICD-10-CM | POA: Diagnosis not present

## 2023-11-26 DIAGNOSIS — I10 Essential (primary) hypertension: Secondary | ICD-10-CM | POA: Diagnosis not present

## 2023-12-12 ENCOUNTER — Encounter: Payer: Self-pay | Admitting: Podiatry

## 2023-12-12 ENCOUNTER — Ambulatory Visit (INDEPENDENT_AMBULATORY_CARE_PROVIDER_SITE_OTHER): Admitting: Podiatry

## 2023-12-12 DIAGNOSIS — E1149 Type 2 diabetes mellitus with other diabetic neurological complication: Secondary | ICD-10-CM | POA: Diagnosis not present

## 2023-12-12 DIAGNOSIS — M2042 Other hammer toe(s) (acquired), left foot: Secondary | ICD-10-CM | POA: Diagnosis not present

## 2023-12-12 DIAGNOSIS — E1140 Type 2 diabetes mellitus with diabetic neuropathy, unspecified: Secondary | ICD-10-CM | POA: Diagnosis not present

## 2023-12-12 DIAGNOSIS — M2041 Other hammer toe(s) (acquired), right foot: Secondary | ICD-10-CM

## 2023-12-13 NOTE — Progress Notes (Signed)
 Subjective:   Patient ID: Donald Carlson, male   DOB: 78 y.o.   MRN: [id number]   HPI Patient presents stating his digits have been bothering him and he is referred by Dr. For evaluation with long-term diabetes   ROS      Objective:  Physical Exam  Neurovascular status moderately diminished with diminishment of pulses PT DP and diminishment of sharp dull vibratory.  Found to have digital deformities with contracture of a moderately rigid nature slight redness but no breakdown of tissue noted and good digital perfusion     Assessment:  Patient with diabetic neuropathy with moderate structural disease discomfort left over the right foot     Plan:  H&P reviewed and at this point I have recommended wider shoe gear with mesh material and at 1 point may require other treatments but will start with this see how he does and I gave him diabetic education today and told him to inspect his feet daily and things to watch out for

## 2023-12-22 ENCOUNTER — Other Ambulatory Visit: Payer: Self-pay | Admitting: Internal Medicine

## 2023-12-22 DIAGNOSIS — R1013 Epigastric pain: Secondary | ICD-10-CM

## 2024-01-08 ENCOUNTER — Other Ambulatory Visit: Payer: Self-pay | Admitting: Internal Medicine

## 2024-01-11 ENCOUNTER — Encounter: Payer: Self-pay | Admitting: Internal Medicine

## 2024-01-11 ENCOUNTER — Other Ambulatory Visit: Payer: Self-pay | Admitting: Internal Medicine

## 2024-01-14 ENCOUNTER — Telehealth: Payer: Self-pay

## 2024-01-14 NOTE — Telephone Encounter (Signed)
 Gave pt a call to let him know he will be receiving a BICARES(JARDIANCE ),spoke with pt knows to send proof of income,faxing provider portion.

## 2024-01-27 ENCOUNTER — Encounter: Payer: Self-pay | Admitting: Internal Medicine

## 2024-01-27 NOTE — Patient Instructions (Addendum)
 Blood work was ordered.       Medications changes include :   change lisinopril  10 mg daily    Continue to Monitor BP     Return in about 6 months (around 07/28/2024) for follow up.    Health Maintenance, Male Adopting a healthy lifestyle and getting preventive care are important in promoting health and wellness. Ask your health care provider about: The right schedule for you to have regular tests and exams. Things you can do on your own to prevent diseases and keep yourself healthy. What should I know about diet, weight, and exercise? Eat a healthy diet  Eat a diet that includes plenty of vegetables, fruits, low-fat dairy products, and lean protein. Do not eat a lot of foods that are high in solid fats, added sugars, or sodium. Maintain a healthy weight Body mass index (BMI) is a measurement that can be used to identify possible weight problems. It estimates body fat based on height and weight. Your health care provider can help determine your BMI and help you achieve or maintain a healthy weight. Get regular exercise Get regular exercise. This is one of the most important things you can do for your health. Most adults should: Exercise for at least 150 minutes each week. The exercise should increase your heart rate and make you sweat (moderate-intensity exercise). Do strengthening exercises at least twice a week. This is in addition to the moderate-intensity exercise. Spend less time sitting. Even light physical activity can be beneficial. Watch cholesterol and blood lipids Have your blood tested for lipids and cholesterol at 79 years of age, then have this test every 5 years. You may need to have your cholesterol levels checked more often if: Your lipid or cholesterol levels are high. You are older than 79 years of age. You are at high risk for heart disease. What should I know about cancer screening? Many types of cancers can be detected early and may often be  prevented. Depending on your health history and family history, you may need to have cancer screening at various ages. This may include screening for: Colorectal cancer. Prostate cancer. Skin cancer. Lung cancer. What should I know about heart disease, diabetes, and high blood pressure? Blood pressure and heart disease High blood pressure causes heart disease and increases the risk of stroke. This is more likely to develop in people who have high blood pressure readings or are overweight. Talk with your health care provider about your target blood pressure readings. Have your blood pressure checked: Every 3-5 years if you are 6-81 years of age. Every year if you are 33 years old or older. If you are between the ages of 38 and 17 and are a current or former smoker, ask your health care provider if you should have a one-time screening for abdominal aortic aneurysm (AAA). Diabetes Have regular diabetes screenings. This checks your fasting blood sugar level. Have the screening done: Once every three years after age 78 if you are at a normal weight and have a low risk for diabetes. More often and at a younger age if you are overweight or have a high risk for diabetes. What should I know about preventing infection? Hepatitis B If you have a higher risk for hepatitis B, you should be screened for this virus. Talk with your health care provider to find out if you are at risk for hepatitis B infection. Hepatitis C Blood testing is recommended for: Everyone born from [birth year]  through 1965. Anyone with known risk factors for hepatitis C. Sexually transmitted infections (STIs) You should be screened each year for STIs, including gonorrhea and chlamydia, if: You are sexually active and are younger than 79 years of age. You are older than 79 years of age and your health care provider tells you that you are at risk for this type of infection. Your sexual activity has changed since you were last screened,  and you are at increased risk for chlamydia or gonorrhea. Ask your health care provider if you are at risk. Ask your health care provider about whether you are at high risk for HIV. Your health care provider may recommend a prescription medicine to help prevent HIV infection. If you choose to take medicine to prevent HIV, you should first get tested for HIV. You should then be tested every 3 months for as long as you are taking the medicine. Follow these instructions at home: Alcohol  use Do not drink alcohol  if your health care provider tells you not to drink. If you drink alcohol : Limit how much you have to 0-2 drinks a day. Know how much alcohol  is in your drink. In the [place] one drink equals one 12 oz bottle of beer (355 mL), one 5 oz glass of wine (148 mL), or one 1 oz glass of hard liquor (44 mL). Lifestyle Do not use any products that contain nicotine or tobacco. These products include cigarettes, chewing tobacco, and vaping devices, such as e-cigarettes. If you need help quitting, ask your health care provider. Do not use street drugs. Do not share needles. Ask your health care provider for help if you need support or information about quitting drugs. General instructions Schedule regular health, dental, and eye exams. Stay current with your vaccines. Tell your health care provider if: You often feel depressed. You have ever been abused or do not feel safe at home. Summary Adopting a healthy lifestyle and getting preventive care are important in promoting health and wellness. Follow your health care provider's instructions about healthy diet, exercising, and getting tested or screened for diseases. Follow your health care provider's instructions on monitoring your cholesterol and blood pressure. This information is not intended to replace advice given to you by your health care provider. Make sure you discuss any questions you have with your health care provider. Document Revised:  07/26/2020 Document Reviewed: 07/26/2020 Elsevier Patient Education  2024 [employer].

## 2024-01-27 NOTE — Progress Notes (Unsigned)
 Subjective:    Patient ID: Donald Carlson, male    DOB: [birth year], 78 y.o.   MRN: [id number]     HPI Donald Carlson is here for a physical exam and his chronic medical problems.   No concerns, no change in health..   Last night BP 78/42 after hot bath - felt lightheaded - something like this can happen about 2/month.  It is not always associated with taking a hot bath-it can just occur randomly.  Medications and allergies reviewed with patient and updated if appropriate.  Current Outpatient Medications on File Prior to Visit  Medication Sig Dispense Refill   aspirin  81 MG tablet Take 1 tablet (81 mg total) by mouth 2 (two) times daily after a meal. (Patient taking differently: Take 81 mg by mouth daily.) 60 tablet 1   carvedilol  (COREG ) 25 MG tablet TAKE 1 TABLET BY MOUTH TWICE DAILY WITH MEALS 180 tablet 0   cholecalciferol  (VITAMIN D ) 1000 units tablet Take 1,000 Units by mouth daily.     Continuous Glucose Receiver (DEXCOM G7 RECEIVER) DEVI UAD to check sugars.  IDDM 1 each 0   Continuous Glucose Sensor (DEXCOM G7 SENSOR) MISC USE TO CHECK BLOOD SUGAR CONTINUOUSLY. CHANGE EVERY 10 DAYS 3 each 5   docusate sodium  (COLACE) 100 MG capsule Take 1 capsule (100 mg total) by mouth 2 (two) times daily. (Patient taking differently: Take 100 mg by mouth every other day.) 60 capsule 3   Flaxseed, Linseed, (FLAX SEED OIL) 1000 MG CAPS Take 1,000 mg by mouth daily.     gabapentin  (NEURONTIN ) 300 MG capsule TAKE 1 CAPSULE(300 MG) BY MOUTH THREE TIMES DAILY 90 capsule 3   glucose blood (ONE TOUCH ULTRA TEST) test strip Use to check blood sugars once a day and prn. Dx e11.9 100 each 1   HUMALOG KWIKPEN 100 UNIT/ML KwikPen Inject into the skin.     insulin  glargine (LANTUS  SOLOSTAR) 100 UNIT/ML Solostar Pen ADMINISTER 26 UNITS UNDER THE SKIN DAILY 15 mL 2   Insulin  Pen Needle (NOVOFINE PEN NEEDLE) 32G X 6 MM MISC USE AS DIRECTED FOR DAILY SAXENDA INJECTIONS 100 each 0   JARDIANCE  25 MG TABS tablet  TAKE ONE TABLET BY MOUTH DAILY BEFORE BREAKFAST 90 tablet 2   Lancets (ONETOUCH ULTRASOFT) lancets Use as instructed to check sugar daily 100 each 12   latanoprost (XALATAN) 0.005 % ophthalmic solution Place 1 drop into both eyes at bedtime.     lisinopril  (ZESTRIL ) 20 MG tablet TAKE 1 TABLET(20 MG) BY MOUTH DAILY 90 tablet 3   Multiple Vitamins-Iron  (MULTIVITAMINS WITH IRON ) TABS tablet Take 1 tablet by mouth daily. 30 tablet 0   pantoprazole  (PROTONIX ) 40 MG tablet TAKE 1 TABLET(40 MG) BY MOUTH TWICE DAILY 90 tablet 1   promethazine  (PHENERGAN ) 12.5 MG tablet Take 1 tablet (12.5 mg total) by mouth every 8 (eight) hours as needed for nausea or vomiting. 60 tablet 5   rosuvastatin  (CRESTOR ) 20 MG tablet TAKE 1 TABLET(20 MG) BY MOUTH DAILY 90 tablet 1   Saw Palmetto , Serenoa repens, (SAW PALMETTO  PO) Take 1 tablet by mouth every morning.     vitamin B-12 (CYANOCOBALAMIN ) 1000 MCG tablet Take 1,000 mcg by mouth daily.     No current facility-administered medications on file prior to visit.    Review of Systems  Constitutional:  Negative for fever.  Eyes:  Negative for visual disturbance.  Respiratory:  Positive for shortness of breath (with moderate exertion - improves quickly with rest). Negative  for cough and wheezing.   Cardiovascular:  Positive for leg swelling (LLE > RLE). Negative for chest pain and palpitations.  Gastrointestinal:  Negative for abdominal pain, blood in stool, constipation and diarrhea.       No gerd  Genitourinary:  Negative for difficulty urinating, dysuria and hematuria.  Musculoskeletal:  Positive for arthralgias and back pain (chronic).  Skin:  Negative for rash.  Neurological:  Positive for light-headedness (when BP is low). Negative for dizziness and headaches.  Psychiatric/Behavioral:  Negative for dysphoric mood. The patient is not nervous/anxious.        Objective:   Vitals:   01/29/24 1303  BP: 126/82  Pulse: 68  Temp: 98 F (36.7 C)  SpO2: 96%    Filed Weights   01/29/24 1303  Weight: 185 lb (83.9 kg)   Body mass index is 28.13 kg/m.  BP Readings from Last 3 Encounters:  01/29/24 126/82  07/27/23 132/74  02/26/23 128/74    Wt Readings from Last 3 Encounters:  01/29/24 185 lb (83.9 kg)  07/27/23 187 lb (84.8 kg)  02/26/23 189 lb (85.7 kg)      Physical Exam Constitutional: He appears well-developed and well-nourished. No distress.  HENT:  Head: Normocephalic and atraumatic.  Right Ear: External ear normal.  Left Ear: External ear normal.  Normal ear canals and TM b/l  Mouth/Throat: Oropharynx is clear and moist. Eyes: Conjunctivae and EOM are normal.  Neck: Neck supple. No tracheal deviation present. No thyromegaly present.  No carotid bruit  Cardiovascular: Normal rate, regular rhythm, normal heart sounds and intact distal pulses.   No murmur heard.  No lower extremity edema. Pulmonary/Chest: Effort normal and breath sounds normal. No respiratory distress. He has no wheezes. He has no rales.  Abdominal: Soft. He exhibits no distension. There is no tenderness.  Genitourinary: deferred  Lymphadenopathy:   He has no cervical adenopathy.  Skin: Skin is warm and dry. He is not diaphoretic.  Psychiatric: He has a normal mood and affect. His behavior is normal.         Assessment & Plan:   Physical exam: Screening blood work  ordered Exercise   not regular Weight  overweight - is good Substance abuse   none   Reviewed recommended immunizations.   Health Maintenance  Topic Date Due   Zoster Vaccines- Shingrix (1 of 2) 01/22/1995   OPHTHALMOLOGY EXAM  07/08/2022   Influenza Vaccine  Never done   Diabetic kidney evaluation - Urine ACR  11/09/2023   COVID-19 Vaccine (4 - 2025-26 season) 11/19/2023   HEMOGLOBIN A1C  01/27/2024   Medicare Annual Wellness (AWV)  03/01/2024   Diabetic kidney evaluation - eGFR measurement  11/11/2024   FOOT EXAM  12/11/2024   DTaP/Tdap/Td (3 - Td or Tdap) 09/27/2032    Pneumococcal Vaccine: 50+ Years  Completed   Hepatitis C Screening  Completed   Meningococcal B Vaccine  Aged Out   Colonoscopy  Discontinued     See Problem List for Assessment and Plan of chronic medical problems.

## 2024-01-28 NOTE — Telephone Encounter (Signed)
 Received provider portion [employer] Jardiance  ,waiting on pt portion.

## 2024-01-28 NOTE — Telephone Encounter (Signed)
 Received pt portion [employer] Jardiance  faxed to [facility] along provider portion and proof of income.

## 2024-01-29 ENCOUNTER — Ambulatory Visit (INDEPENDENT_AMBULATORY_CARE_PROVIDER_SITE_OTHER): Admitting: Internal Medicine

## 2024-01-29 VITALS — BP 126/82 | HR 68 | Temp 98.0°F | Ht 68.0 in | Wt 185.0 lb

## 2024-01-29 DIAGNOSIS — I951 Orthostatic hypotension: Secondary | ICD-10-CM

## 2024-01-29 DIAGNOSIS — E1140 Type 2 diabetes mellitus with diabetic neuropathy, unspecified: Secondary | ICD-10-CM

## 2024-01-29 DIAGNOSIS — I152 Hypertension secondary to endocrine disorders: Secondary | ICD-10-CM | POA: Diagnosis not present

## 2024-01-29 DIAGNOSIS — E1169 Type 2 diabetes mellitus with other specified complication: Secondary | ICD-10-CM | POA: Diagnosis not present

## 2024-01-29 DIAGNOSIS — E785 Hyperlipidemia, unspecified: Secondary | ICD-10-CM | POA: Diagnosis not present

## 2024-01-29 DIAGNOSIS — I959 Hypotension, unspecified: Secondary | ICD-10-CM | POA: Insufficient documentation

## 2024-01-29 DIAGNOSIS — Z0000 Encounter for general adult medical examination without abnormal findings: Secondary | ICD-10-CM | POA: Diagnosis not present

## 2024-01-29 DIAGNOSIS — E1142 Type 2 diabetes mellitus with diabetic polyneuropathy: Secondary | ICD-10-CM

## 2024-01-29 DIAGNOSIS — E1159 Type 2 diabetes mellitus with other circulatory complications: Secondary | ICD-10-CM | POA: Diagnosis not present

## 2024-01-29 DIAGNOSIS — E538 Deficiency of other specified B group vitamins: Secondary | ICD-10-CM | POA: Diagnosis not present

## 2024-01-29 DIAGNOSIS — N1832 Chronic kidney disease, stage 3b: Secondary | ICD-10-CM

## 2024-01-29 DIAGNOSIS — Z7984 Long term (current) use of oral hypoglycemic drugs: Secondary | ICD-10-CM

## 2024-01-29 LAB — CBC
HCT: 39.8 % (ref 39.0–52.0)
Hemoglobin: 13.3 g/dL (ref 13.0–17.0)
MCHC: 33.4 g/dL (ref 30.0–36.0)
MCV: 82.9 fl (ref 78.0–100.0)
Platelets: 189 K/uL (ref 150.0–400.0)
RBC: 4.8 Mil/uL (ref 4.22–5.81)
RDW: 15.2 % (ref 11.5–15.5)
WBC: 6.9 K/uL (ref 4.0–10.5)

## 2024-01-29 LAB — COMPREHENSIVE METABOLIC PANEL WITH GFR
ALT: 17 U/L (ref 0–53)
AST: 19 U/L (ref 0–37)
Albumin: 4.4 g/dL (ref 3.5–5.2)
Alkaline Phosphatase: 70 U/L (ref 39–117)
BUN: 26 mg/dL — ABNORMAL HIGH (ref 6–23)
CO2: 28 meq/L (ref 19–32)
Calcium: 9.8 mg/dL (ref 8.4–10.5)
Chloride: 106 meq/L (ref 96–112)
Creatinine, Ser: 1.66 mg/dL — ABNORMAL HIGH (ref 0.40–1.50)
GFR: 39.1 mL/min — ABNORMAL LOW (ref >60.00)
Glucose, Bld: 137 mg/dL — ABNORMAL HIGH (ref 70–99)
Potassium: 5.3 meq/L — ABNORMAL HIGH (ref 3.5–5.1)
Sodium: 140 meq/L (ref 135–145)
Total Bilirubin: 0.6 mg/dL (ref 0.2–1.2)
Total Protein: 7.5 g/dL (ref 6.0–8.3)

## 2024-01-29 LAB — LIPID PANEL
Cholesterol: 118 mg/dL (ref 0–200)
HDL: 37.5 mg/dL — ABNORMAL LOW (ref >39.00)
LDL Cholesterol: 57 mg/dL (ref 0–99)
NonHDL: 80.15
Total CHOL/HDL Ratio: 3
Triglycerides: 114 mg/dL (ref 0.0–149.0)
VLDL: 22.8 mg/dL (ref 0.0–40.0)

## 2024-01-29 LAB — MICROALBUMIN / CREATININE URINE RATIO
Creatinine,U: 58.9 mg/dL
Microalb Creat Ratio: 70 mg/g — ABNORMAL HIGH (ref 0.0–30.0)
Microalb, Ur: 4.1 mg/dL — ABNORMAL HIGH (ref 0.0–1.9)

## 2024-01-29 LAB — VITAMIN B12: Vitamin B-12: 806 pg/mL (ref 211–911)

## 2024-01-29 LAB — TSH: TSH: 1.61 u[IU]/mL (ref 0.35–5.50)

## 2024-01-29 LAB — HEMOGLOBIN A1C: Hgb A1c MFr Bld: 8.6 % — ABNORMAL HIGH (ref 4.6–6.5)

## 2024-01-29 LAB — VITAMIN D 25 HYDROXY (VIT D DEFICIENCY, FRACTURES): VITD: 36.62 ng/mL (ref 30.00–100.00)

## 2024-01-29 MED ORDER — LISINOPRIL 10 MG PO TABS: 10.0000 mg | ORAL_TABLET | Freq: Every day | ORAL | 3 refills | Status: AC

## 2024-01-29 NOTE — Assessment & Plan Note (Signed)
 Chronic Associated with diabetic neuropathy Management per Dr. Tommas Lab Results  Component Value Date   HGBA1C 7.9 (H) 07/27/2023   Sugars controlled A1c, urine albumin/creatinine ratio Continue Jardiance  25 mg daily, lantus  insulin  26 units daily, on Humalog Has continuous glucose monitor  Stressed regular exercise, diabetic diet

## 2024-01-29 NOTE — Assessment & Plan Note (Signed)
Chronic Regular exercise and healthy diet encouraged Check lipid panel  Continue Crestor 20 mg daily 

## 2024-01-29 NOTE — Assessment & Plan Note (Addendum)
 New Occurs about 2/month Last night - BP 78/42 Decrease lisinopril  to 10 mg daily Continue carvedilol  25 mg twice daily Monitor BP closely at home

## 2024-01-29 NOTE — Assessment & Plan Note (Signed)
 Chronic Mild-stage 3b Blood pressure, sugar well-controlled Diet low in sodium Drink plenty of water  No NSAIDs Cmp, cbc Saw nephrology-decreased GFR thought to be related to Jardiance , ACE inhibitor

## 2024-01-29 NOTE — Assessment & Plan Note (Signed)
 Chronic Sees podiatry  Taking neurontin  300 mg 3 times daily Sugars controlled

## 2024-01-29 NOTE — Assessment & Plan Note (Signed)
 Chronic Check B12 level Continue B12 daily

## 2024-01-29 NOTE — Assessment & Plan Note (Addendum)
 Chronic Blood pressure well controlled  - overcontrolled - having episodes of hypotension CMP, CBC Continue Coreg  25 mg twice daily Decrease lisinopril  to 10 mg daily Monitor BP closely at home-he will let me know if his blood pressure goes too high or if he continues to have low episodes

## 2024-02-01 ENCOUNTER — Ambulatory Visit: Payer: Self-pay | Admitting: Internal Medicine

## 2024-02-04 ENCOUNTER — Other Ambulatory Visit (HOSPITAL_COMMUNITY): Payer: Self-pay

## 2024-03-04 ENCOUNTER — Other Ambulatory Visit: Payer: Self-pay | Admitting: Podiatry

## 2024-03-23 ENCOUNTER — Other Ambulatory Visit: Payer: Self-pay | Admitting: Internal Medicine

## 2024-03-23 DIAGNOSIS — R1013 Epigastric pain: Secondary | ICD-10-CM

## 2024-04-06 ENCOUNTER — Other Ambulatory Visit: Payer: Self-pay | Admitting: Internal Medicine

## 2024-04-14 NOTE — Telephone Encounter (Signed)
 Received denial letter from [facility] due to pt income is over,spoke with pt wife is aware that it was denied.

## 2024-04-22 ENCOUNTER — Other Ambulatory Visit: Payer: Self-pay | Admitting: Internal Medicine

## 2024-05-16 ENCOUNTER — Ambulatory Visit

## 2024-07-28 ENCOUNTER — Ambulatory Visit: Admitting: Internal Medicine
